# Patient Record
Sex: Male | Born: 1975 | Race: Black or African American | Hispanic: No | Smoking: Former smoker
Health system: Southern US, Community
[De-identification: ages and names within clinical notes are randomized; demographics above are authoritative.]

## PROBLEM LIST (undated history)

## (undated) ENCOUNTER — Emergency Department (HOSPITAL_COMMUNITY): Admission: EM | Payer: PRIVATE HEALTH INSURANCE | Source: Home / Self Care

## (undated) DIAGNOSIS — G8929 Other chronic pain: Secondary | ICD-10-CM

## (undated) DIAGNOSIS — Z86718 Personal history of other venous thrombosis and embolism: Secondary | ICD-10-CM

## (undated) DIAGNOSIS — M5000 Cervical disc disorder with myelopathy, unspecified cervical region: Secondary | ICD-10-CM

## (undated) DIAGNOSIS — I739 Peripheral vascular disease, unspecified: Secondary | ICD-10-CM

## (undated) DIAGNOSIS — F32A Depression, unspecified: Secondary | ICD-10-CM

## (undated) DIAGNOSIS — J189 Pneumonia, unspecified organism: Secondary | ICD-10-CM

## (undated) DIAGNOSIS — I10 Essential (primary) hypertension: Secondary | ICD-10-CM

## (undated) DIAGNOSIS — F329 Major depressive disorder, single episode, unspecified: Secondary | ICD-10-CM

## (undated) DIAGNOSIS — G4730 Sleep apnea, unspecified: Secondary | ICD-10-CM

## (undated) DIAGNOSIS — M1710 Unilateral primary osteoarthritis, unspecified knee: Secondary | ICD-10-CM

## (undated) DIAGNOSIS — K219 Gastro-esophageal reflux disease without esophagitis: Secondary | ICD-10-CM

## (undated) DIAGNOSIS — F319 Bipolar disorder, unspecified: Secondary | ICD-10-CM

## (undated) DIAGNOSIS — M2170 Unequal limb length (acquired), unspecified site: Secondary | ICD-10-CM

## (undated) DIAGNOSIS — F419 Anxiety disorder, unspecified: Secondary | ICD-10-CM

## (undated) DIAGNOSIS — A6000 Herpesviral infection of urogenital system, unspecified: Secondary | ICD-10-CM

## (undated) DIAGNOSIS — Z8489 Family history of other specified conditions: Secondary | ICD-10-CM

## (undated) DIAGNOSIS — M2550 Pain in unspecified joint: Secondary | ICD-10-CM

## (undated) DIAGNOSIS — Q6102 Congenital multiple renal cysts: Secondary | ICD-10-CM

## (undated) HISTORY — DX: Bipolar disorder, unspecified: F31.9

## (undated) HISTORY — DX: Personal history of other venous thrombosis and embolism: Z86.718

## (undated) HISTORY — DX: Unequal limb length (acquired), unspecified site: M21.70

## (undated) HISTORY — DX: Herpesviral infection of urogenital system, unspecified: A60.00

## (undated) HISTORY — DX: Unilateral primary osteoarthritis, unspecified knee: M17.10

## (undated) HISTORY — PX: KNEE SURGERY: SHX244

## (undated) HISTORY — PX: TONSILECTOMY, ADENOIDECTOMY, BILATERAL MYRINGOTOMY AND TUBES: SHX2538

## (undated) HISTORY — PX: NECK SURGERY: SHX720

---

## 2008-10-01 ENCOUNTER — Emergency Department (HOSPITAL_COMMUNITY): Admission: EM | Admit: 2008-10-01 | Discharge: 2008-10-01 | Payer: Self-pay | Admitting: Family Medicine

## 2009-04-13 ENCOUNTER — Emergency Department (HOSPITAL_COMMUNITY): Admission: EM | Admit: 2009-04-13 | Discharge: 2009-04-14 | Payer: Self-pay | Admitting: Emergency Medicine

## 2009-08-25 ENCOUNTER — Emergency Department (HOSPITAL_COMMUNITY): Admission: EM | Admit: 2009-08-25 | Discharge: 2009-08-26 | Payer: Self-pay | Admitting: Emergency Medicine

## 2009-08-25 HISTORY — PX: NECK SURGERY: SHX720

## 2009-10-31 ENCOUNTER — Inpatient Hospital Stay (HOSPITAL_COMMUNITY): Admission: EM | Admit: 2009-10-31 | Discharge: 2009-11-17 | Payer: Self-pay | Admitting: Neurosurgery

## 2009-10-31 ENCOUNTER — Encounter: Payer: Self-pay | Admitting: Emergency Medicine

## 2009-10-31 ENCOUNTER — Ambulatory Visit: Payer: Self-pay | Admitting: Emergency Medicine

## 2009-11-06 ENCOUNTER — Ambulatory Visit: Payer: Self-pay | Admitting: Physical Medicine & Rehabilitation

## 2009-11-06 ENCOUNTER — Ambulatory Visit: Payer: Self-pay | Admitting: Surgery

## 2009-11-06 ENCOUNTER — Encounter: Payer: Self-pay | Admitting: Emergency Medicine

## 2009-11-20 ENCOUNTER — Encounter: Admission: RE | Admit: 2009-11-20 | Discharge: 2010-01-15 | Payer: Self-pay | Admitting: Neurosurgery

## 2009-11-21 ENCOUNTER — Encounter: Payer: Self-pay | Admitting: Family Medicine

## 2009-11-21 ENCOUNTER — Ambulatory Visit: Payer: Self-pay | Admitting: Family Medicine

## 2009-11-21 DIAGNOSIS — F17200 Nicotine dependence, unspecified, uncomplicated: Secondary | ICD-10-CM | POA: Insufficient documentation

## 2009-11-21 DIAGNOSIS — A6000 Herpesviral infection of urogenital system, unspecified: Secondary | ICD-10-CM | POA: Insufficient documentation

## 2009-11-21 DIAGNOSIS — I82409 Acute embolism and thrombosis of unspecified deep veins of unspecified lower extremity: Secondary | ICD-10-CM | POA: Insufficient documentation

## 2009-11-21 DIAGNOSIS — Z86718 Personal history of other venous thrombosis and embolism: Secondary | ICD-10-CM

## 2009-11-21 DIAGNOSIS — I10 Essential (primary) hypertension: Secondary | ICD-10-CM

## 2009-11-21 DIAGNOSIS — G4733 Obstructive sleep apnea (adult) (pediatric): Secondary | ICD-10-CM | POA: Insufficient documentation

## 2009-11-21 HISTORY — DX: Personal history of other venous thrombosis and embolism: Z86.718

## 2009-11-21 HISTORY — DX: Herpesviral infection of urogenital system, unspecified: A60.00

## 2009-11-21 LAB — CONVERTED CEMR LAB
CO2: 22 meq/L (ref 19–32)
Chloride: 105 meq/L (ref 96–112)
Creatinine, Ser: 0.67 mg/dL (ref 0.40–1.50)
Glucose, Bld: 82 mg/dL (ref 70–99)
HDL: 47 mg/dL (ref >39)
INR: 2.3
LDL Cholesterol: 117 mg/dL — ABNORMAL HIGH (ref 0–99)
Total CHOL/HDL Ratio: 3.9

## 2009-11-22 ENCOUNTER — Encounter: Payer: Self-pay | Admitting: Family Medicine

## 2009-11-28 ENCOUNTER — Ambulatory Visit: Payer: Self-pay | Admitting: Family Medicine

## 2009-11-28 LAB — CONVERTED CEMR LAB: INR: 1.7

## 2009-12-05 ENCOUNTER — Telehealth: Payer: Self-pay | Admitting: Family Medicine

## 2009-12-05 ENCOUNTER — Ambulatory Visit: Payer: Self-pay | Admitting: Family Medicine

## 2009-12-05 LAB — CONVERTED CEMR LAB: INR: 3.1

## 2009-12-12 ENCOUNTER — Encounter: Payer: Self-pay | Admitting: Family Medicine

## 2009-12-12 ENCOUNTER — Ambulatory Visit: Payer: Self-pay | Admitting: Family Medicine

## 2009-12-26 ENCOUNTER — Ambulatory Visit: Payer: Self-pay | Admitting: Family Medicine

## 2009-12-26 LAB — CONVERTED CEMR LAB: INR: 1.7

## 2010-01-02 ENCOUNTER — Ambulatory Visit: Payer: Self-pay | Admitting: Family Medicine

## 2010-01-09 ENCOUNTER — Ambulatory Visit: Payer: Self-pay | Admitting: Family Medicine

## 2010-01-16 ENCOUNTER — Ambulatory Visit: Payer: Self-pay | Admitting: Emergency Medicine

## 2010-01-23 ENCOUNTER — Ambulatory Visit: Payer: Self-pay | Admitting: Family Medicine

## 2010-02-07 ENCOUNTER — Emergency Department (HOSPITAL_COMMUNITY): Admission: EM | Admit: 2010-02-07 | Discharge: 2010-02-07 | Payer: Self-pay | Admitting: Emergency Medicine

## 2010-02-20 ENCOUNTER — Ambulatory Visit: Payer: Self-pay | Admitting: Family Medicine

## 2010-02-20 ENCOUNTER — Encounter: Payer: Self-pay | Admitting: Family Medicine

## 2010-02-21 ENCOUNTER — Ambulatory Visit: Payer: Self-pay | Admitting: Family Medicine

## 2010-02-21 DIAGNOSIS — M542 Cervicalgia: Secondary | ICD-10-CM | POA: Insufficient documentation

## 2010-02-22 ENCOUNTER — Telehealth: Payer: Self-pay | Admitting: Family Medicine

## 2010-02-27 ENCOUNTER — Ambulatory Visit: Payer: Self-pay | Admitting: Family Medicine

## 2010-02-27 ENCOUNTER — Encounter: Payer: Self-pay | Admitting: Psychology

## 2010-03-13 ENCOUNTER — Encounter: Payer: Self-pay | Admitting: Family Medicine

## 2010-04-25 ENCOUNTER — Encounter: Payer: Self-pay | Admitting: Family Medicine

## 2010-04-25 ENCOUNTER — Observation Stay (HOSPITAL_COMMUNITY): Admission: EM | Admit: 2010-04-25 | Discharge: 2010-04-26 | Payer: Self-pay | Admitting: Emergency Medicine

## 2010-04-25 ENCOUNTER — Ambulatory Visit: Payer: Self-pay | Admitting: Family Medicine

## 2010-04-25 DIAGNOSIS — F32A Depression, unspecified: Secondary | ICD-10-CM | POA: Insufficient documentation

## 2010-04-25 DIAGNOSIS — F329 Major depressive disorder, single episode, unspecified: Secondary | ICD-10-CM

## 2010-04-26 ENCOUNTER — Ambulatory Visit: Payer: Self-pay | Admitting: Vascular Surgery

## 2010-04-26 ENCOUNTER — Telehealth: Payer: Self-pay | Admitting: Family Medicine

## 2010-04-26 ENCOUNTER — Encounter: Payer: Self-pay | Admitting: Family Medicine

## 2010-05-02 ENCOUNTER — Ambulatory Visit: Payer: Self-pay | Admitting: Family Medicine

## 2010-05-29 ENCOUNTER — Ambulatory Visit: Payer: Self-pay | Admitting: Family Medicine

## 2010-06-02 ENCOUNTER — Emergency Department (HOSPITAL_COMMUNITY)
Admission: EM | Admit: 2010-06-02 | Discharge: 2010-06-02 | Payer: Self-pay | Source: Home / Self Care | Admitting: Emergency Medicine

## 2010-06-28 ENCOUNTER — Encounter: Payer: Self-pay | Admitting: Family Medicine

## 2010-07-12 ENCOUNTER — Ambulatory Visit: Payer: Self-pay | Admitting: Family Medicine

## 2010-07-31 ENCOUNTER — Observation Stay (HOSPITAL_COMMUNITY)
Admission: EM | Admit: 2010-07-31 | Discharge: 2010-08-01 | Payer: Self-pay | Source: Home / Self Care | Admitting: Emergency Medicine

## 2010-08-01 ENCOUNTER — Encounter (INDEPENDENT_AMBULATORY_CARE_PROVIDER_SITE_OTHER): Payer: Self-pay | Admitting: Emergency Medicine

## 2010-08-02 ENCOUNTER — Ambulatory Visit: Payer: Self-pay | Admitting: Family Medicine

## 2010-08-02 DIAGNOSIS — M25571 Pain in right ankle and joints of right foot: Secondary | ICD-10-CM | POA: Insufficient documentation

## 2010-08-09 ENCOUNTER — Ambulatory Visit: Payer: Self-pay | Admitting: Family Medicine

## 2010-08-09 DIAGNOSIS — M2170 Unequal limb length (acquired), unspecified site: Secondary | ICD-10-CM

## 2010-08-09 HISTORY — DX: Unequal limb length (acquired), unspecified site: M21.70

## 2010-09-09 ENCOUNTER — Ambulatory Visit
Admission: RE | Admit: 2010-09-09 | Discharge: 2010-09-09 | Payer: Self-pay | Source: Home / Self Care | Attending: Sports Medicine | Admitting: Sports Medicine

## 2010-09-09 DIAGNOSIS — M1710 Unilateral primary osteoarthritis, unspecified knee: Secondary | ICD-10-CM | POA: Insufficient documentation

## 2010-09-09 DIAGNOSIS — G8929 Other chronic pain: Secondary | ICD-10-CM | POA: Insufficient documentation

## 2010-09-09 DIAGNOSIS — M2550 Pain in unspecified joint: Secondary | ICD-10-CM

## 2010-09-09 DIAGNOSIS — IMO0002 Reserved for concepts with insufficient information to code with codable children: Secondary | ICD-10-CM

## 2010-09-09 HISTORY — DX: Reserved for concepts with insufficient information to code with codable children: IMO0002

## 2010-09-15 ENCOUNTER — Encounter: Payer: Self-pay | Admitting: Neurosurgery

## 2010-09-24 NOTE — Assessment & Plan Note (Signed)
Summary: PE, ? OSA   Visit Type:  Hospital Follow-up Primary Provider/Referring Provider:  Romero Belling MD  CC:  blood clots/consult.  History of Present Illness: 35 yo obese gentleman with hx OSA/CPAP as a child. Occas tobacco use. I met him on hospitalization 10/2009 for acute cervical radiculopathy that required surgery by Dr Wynetta Emery. His course was complicated by PE. He tolerated anticoagulation and is currently on coumadin being followed at the [facility]. Denies bleeding, CP. Does still get some exertional SOB, has to stop to rest. Has been doing yard work, walking. His UE radicular symptoms are improving, still some numbness and weakness.    Preventive Screening-Counseling & Management  Alcohol-Tobacco     Smoking Cessation Counseling: yes     Smoke Cessation Stage: precontemplative  Current Medications (verified): 1)  Warfarin Sodium 5 Mg Tabs (Warfarin Sodium) .... Take As Directed For Inr Goal 2-3. 2)  Hydrochlorothiazide 25 Mg Tabs (Hydrochlorothiazide) .Marland Kitchen.. 1 Tab By Mouth Daily For High Blood Pressure 3)  Hydrocodone-Acetaminophen 5-500 Mg Tabs (Hydrocodone-Acetaminophen) .Marland Kitchen.. 1 Tab By Mouth Every 4 Hours As Needed For Pain 4)  Valtrex 500 Mg Tabs (Valacyclovir Hcl) .Marland Kitchen.. 1 Tab By Mouth Two Times A Day X3 Days At First Sign of Herpes Outbreak  Allergies (verified): No Known Drug Allergies  Social History: Lives with fiance (Contina Ward, dob 06/26/1972) in [place].  Unemployed from [employer].  Smokes 1-3 cigarettes daily.  Rare alcohol use. 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  The patient denies shortness of breath at rest, productive cough, non-productive cough, coughing up blood, chest pain, irregular heartbeats, loss of appetite, weight change, abdominal pain, difficulty swallowing, tooth/dental problems, headaches, nasal  congestion/difficulty breathing through nose, sneezing, itching, ear ache, anxiety, depression, hand/feet swelling, joint stiffness or pain, rash, change in color of mucus, and fever.    Vital Signs:  Patient profile:   35 year old male Height:      74 inches Weight:      327 pounds O2 Sat:      96 % on Room air Temp:     98.2 degrees F oral Pulse rate:   72 / minute BP sitting:   120 / 68  (left arm) Cuff size:   regular  Vitals Entered By: Denna Haggard, CMA (Jan 16, 2010 10:03 AM)  O2 Sat at Rest %:  96% O2 Flow:  Room air  Reason for Visit Patient states he developed blood clots after neck surgery that have now traveled to lungs.  Physical Exam  General:  Well-developed,well-nourished,in no acute distress; alert,appropriate and cooperative throughout examination Eyes:  conjunctiva and sclera clear Nose:  no deformity, discharge, inflammation, or lesions Mouth:  no deformity or lesions Neck:  no masses, thyromegaly, or abnormal cervical nodes Lungs:  clear bilaterally to auscultation and percussion Heart:  regular rate and rhythm, S1, S2 without murmurs, rubs, gallops, or clicks Abdomen:  not examined Extremities:  trace pretibial edema Neurologic:  non-focal Psych:  alert and cooperative; normal mood and affect; normal attention span and concentration   Impression & Recommendations:  Problem # 1:  PULMONARY EMBOLISM (ICD-415.19)  - coumadin x 6 mo., f/u at [facility] - hypercoag workup after off coumadin x 1 mo  Orders: New Patient Level IV (66440)  Problem # 2:  SLEEP APNEA, OBSTRUCTIVE (ICD-327.23)  -probably  needs repeat PSG in the future, not in a position right now to get it due to cost. We will revisit when he gets insurance or if he qualifies for disability.   Orders: New Patient Level IV (16109)  Problem # 3:  OBESITY, UNSPECIFIED (ICD-278.00)  Problem # 4:  TOBACCO ABUSE (ICD-305.1)  Orders: New Patient Level IV (60454)  Patient  Instructions: 1)  Please continue your coumadin as directed by Dr Constance Goltz and the [facility] 2)  You will need to be treated for 6 months with coumadin, then you can come off the medication.  3)  After you are of coumadin for a month, they will need to order labwork to see if you are at higher risk for blood clots (a hypercoaguability panel) 4)  Please arrange to see Dr Delton Coombes when you are ready to set a sleep study and talk about CPAP.  5)  Call Dr Delton Coombes if any new symptoms arise, especially shortness of breath or chest pain.    Immunization History:  Influenza Immunization History:    Influenza:  fluvax 3+ (10/25/2009)

## 2010-09-24 NOTE — Assessment & Plan Note (Signed)
Summary: F/U/KH   Vital Signs:  Patient profile:   35 year old male Height:      74 inches Weight:      346 pounds BMI:     44.58 BSA:     2.75 Temp:     98.6 degrees F Pulse rate:   97 / minute BP sitting:   133 / 89  Vitals Entered By: Jone Baseman CMA (February 27, 2010 3:09 PM) CC: f/u neck pain, sputum production, tobacco use Is Patient Diabetic? No Pain Assessment Patient in pain? yes     Location: neck Intensity: 6   Primary Care Provider:  Romero Belling MD  CC:  f/u neck pain, sputum production, and tobacco use.  History of Present Illness: 1. Neck pain:  He complains today of 6/10 posterior neck pain.  This has been going on for the past couple of months since his surgery.  Located in the cervical neck region involving mostly the paraspinal muscles.  Was evaluated for this in the ED for this on 06/16 and was given percocet #20, which helped though he is now out of them.  He has a history of neck surgery in March (see below) and was on Vicodin from his neck surgery.  He is asking for Percocet today.  He didn't get the Neurontin filled because he thought that it was too expensive.  2. Sputum production:  Complaining of spitting up brown phlegm approximately two times a day for the past 2 weeks.        ROS:  He denies abdominal pain, chest pain, dyspnea, hemoptysis, hematochezia, melena.  3. Hx of DVT: Confirmed with pt that he had only one DVT / PE after his surgery and didn't have any before that.  He has stopped Coumadin therapy      ROS: denies leg swelling, redness, warmth, or pain  4. Tobacco Use:  Smokes 3 cigars a day.  He is not really interested in smoking.  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  Quadriplegia is resolved but he complains of persistent though improving weakness and paresthesias of the bilateral upper and lower extremities.  He states  these are not getting worse and are in fact marginally better.  When I first saw him, he was using a walker, but he walks under his own power now.  He was advised by his neurosurgeon that this will slowly get better.  He has been unable to f/u with neurosurgery secondary to finances.  He was supposed to have CT of neck but cancelled it secondary to finances.    Habits & Providers  Alcohol-Tobacco-Diet     Tobacco Status: current     Tobacco Counseling: to quit use of tobacco products     Other Tobacco cigar     Other per week 3 a day  Current Medications (verified): 1)  Warfarin Sodium 5 Mg Tabs (Warfarin Sodium) .... Take As Directed For Inr Goal 2-3. 2)  Hydrochlorothiazide 25 Mg Tabs (Hydrochlorothiazide) .Marland Kitchen.. 1 Tab By Mouth Daily For High Blood Pressure 3)  Valtrex 500 Mg Tabs (Valacyclovir Hcl) .Marland Kitchen.. 1 Tab By Mouth Two Times A Day X3 Days At First Sign of Herpes Outbreak 4)  Gabapentin 100 Mg Caps (Gabapentin) .Marland Kitchen.. 1 Cap By Mouth Three Times A Day As Directed  Allergies: No Known Drug Allergies  Past History:  Past Medical History: Hx of DVT / PE  Past  Surgical History: Reviewed history from 11/21/2009 and no changes required. Urgent Diskectomy for Cord Compression, 10/2009. LEFT Knee Surgery,  [phone].  Social History: Reviewed history from 01/16/2010 and no changes required. Lives with fiance (Contina Ward, dob 06/26/1972) in [place].  Unemployed from [employer].  Smokes 1-3 cigars daily.  Rare alcohol use. 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  On probation for selling drugs  Physical Exam  General:  Well-developed,well-nourished,in no acute distress; alert,appropriate and cooperative throughout examination Mouth:  OP pink and moist.  No excessive phlegm appreciated. Neck:  supple and no masses.   Decreased extension and lateral flexsion and rotation due to pain. Lungs:  normal respiratory effort, normal breath sounds, no crackles, and no wheezes.   Heart:  normal  rate, regular rhythm, and no murmur.   Abdomen:  soft, non-tender, and normal bowel sounds.   Msk:  Cervical paraspinal muscles are TTP diffusely Neurologic:  alert & oriented X3 and cranial nerves II-XII intact.  4/5 strength bilateral upper extremities, 5/5 strength bilateral lower extremities Psych:  not anxious appearing and not depressed appearing.     Impression & Recommendations:  Problem # 1:  NECK PAIN (ICD-723.1) Assessment Unchanged  Likely postsurgical pain.  Could be combination of neurologic pain and musculoskeletal pain.  Would not fill Percocet.  Advised to try Neurotin and that it was only $10 at [facility].  Orders: [facility]- Est  Level 4 (56387)  Problem # 2:  ABNORMAL SPUTUM (ICD-786.4) Assessment: New  Likely from smoking.  Advised smoking cessation  Orders: [facility]- Est  Level 4 (99214)  Problem # 3:  OTHER POSTSURGICAL STATUS OTHER (ICD-V45.89) Assessment: New postsurgical follow up for his cervical spine surgery.  He is in the process of getting set up with [facility].  Will refer him back to Dr. Wynetta Emery for post op follow up.  Problem # 4:  DEEP VENOUS THROMBOPHLEBITIS, BILATERAL (ICD-453.40) Assessment: Improved  Confirmed that he has not had any other DVT / PE so okay to stop Coumadin therapy.  Orders: [facility]- Est  Level 4 (56433)  Problem # 5:  TOBACCO ABUSE (ICD-305.1) Assessment: Unchanged  Advised to stop smoking.  Not interested at this time.  Orders: [facility]- Est  Level 4 (29518)  Complete Medication List: 1)  Warfarin Sodium 5 Mg Tabs (Warfarin sodium) .... Take as directed for inr goal 2-3. 2)  Hydrochlorothiazide 25 Mg Tabs (Hydrochlorothiazide) .Marland Kitchen.. 1 tab by mouth daily for high blood pressure 3)  Valtrex 500 Mg Tabs (Valacyclovir hcl) .Marland Kitchen.. 1 tab by mouth two times a day x3 days at first sign of herpes outbreak 4)  Gabapentin 100 Mg Caps (Gabapentin) .Marland Kitchen.. 1 cap by mouth three times a day as directed  Prevention & Chronic Care Immunizations    Influenza vaccine: Fluvax 3+  (10/25/2009)    Tetanus booster: Not documented    Pneumococcal vaccine: Not documented  Other Screening   Smoking status: current  (02/27/2010)   Smoking cessation counseling: yes  (01/16/2010)  Hypertension   Last Blood Pressure: 133 / 89  (02/27/2010)   Serum creatinine: 0.67  (11/21/2009)   Serum potassium 4.2  (11/21/2009)  Self-Management Support :   Personal Goals (by the next clinic visit) :      Personal blood pressure goal: 140/90  (11/22/2009)   Hypertension self-management support: Not documented    Hypertension self-management support not done because: Good outcomes  (11/22/2009)

## 2010-09-24 NOTE — Assessment & Plan Note (Signed)
Summary: Behavioral Medicine Student Consultation   Primary Care Provider:  Romero Belling MD   History of Present Illness: I met with patient to discuss smoking cessation.  Patient reported smoking a box of cigars (5) per day, as well as cigarettes occasionally.  This pattern has been present for approximately 2 years.  Patient stopped smoking for a period of 7 years prior to that while incarcerated.    Allergies: No Known Drug Allergies   Impression & Recommendations:  Problem # 1:  TOBACCO ABUSE (ICD-305.1) Assessment Comment Only Patient appeared to be in the precontemplation stage of change regarding smoking cessation.  On a scale of 1-10, he rated his motivation to quit as a 2 or 3.  Similarly, he rated his confidence in his ability to quit as a 2.  His barriers to quitting included that smoking serves as a stress reliever and is enjoyable. His motivators for quitting included the presence of health problems related to smoking (coughs up brown mucus), the potential for further related health problems (e.g., potential to exacerbate pulmonary blood clots), and the high cost of cigars.  He currently smokes around 5 cigars per day, in addition to some cigarettes, and has done so for a period of 2 years.  Patient stopped smoking for a period of 7 years prior to that while incarcerated.  He reported experiencing some withdrawl symptoms that dissipated within 2 weeks.  He did not report craving cigarattes or cigars for the remainder of his time spent incarcerated.  I reflected to patient that he did not appear to be motivated to quit smoking at this time, all things considered, and he did not disagree.  I gave patient a brochure for the smoking cessation course to consider given that he has not tried medications, nicotine replacement, et Karie Soda to aid with smoking cessation in the past.  Patient's physician may wish to discuss medication options with him if he becomes more motivated to quit.   Armida Sans  Behavioral Medicine Student  February 27, 2010 4:36 PM  Complete Medication List: 1)  Warfarin Sodium 5 Mg Tabs (Warfarin sodium) .... Take as directed for inr goal 2-3. 2)  Hydrochlorothiazide 25 Mg Tabs (Hydrochlorothiazide) .Marland Kitchen.. 1 tab by mouth daily for high blood pressure 3)  Valtrex 500 Mg Tabs (Valacyclovir hcl) .Marland Kitchen.. 1 tab by mouth two times a day x3 days at first sign of herpes outbreak 4)  Gabapentin 100 Mg Caps (Gabapentin) .Marland Kitchen.. 1 cap by mouth three times a day as directed  Appended Document: Behavioral Medicine Student Consultation Reviewed note and the meeting with the student, Edison Pace.  Might also recommend future orienting Mr. Rambert.  Can he picture a time when stopping smoking would be more important?  What would need to change?  Would agree that based on Katie's assessment, patient is in precontemplation.  Discussing smoking cessation aids will likely be a bad match but potentially helpful if the motivation shifts.

## 2010-09-24 NOTE — Assessment & Plan Note (Signed)
Summary: f/up,tcb   Vital Signs:  Patient profile:   35 year old male Height:      74 inches Weight:      343.4 pounds BMI:     44.25 Temp:     98.7 degrees F oral Pulse rate:   80 / minute BP sitting:   130 / 87  (left arm) Cuff size:   large  Vitals Entered By: Garen Grams LPN (May 02, 2010 11:13 AM) CC: hfu Is Patient Diabetic? No Pain Assessment Patient in pain? yes     Location: rt side   Primary Care Provider:  Angelena Sole MD  CC:  hfu.  History of Present Illness: Hospital follow up  Pt recently discharged from the hospital because of right sided lung pain, hemoptysis and found to have a CAP.  1. CAP:  overall he is doing much better.  He is still coughing up some phlegm but no dark stuff and no hemoptysis.  Right sided lung pain is also improved.  He took 4 days of the Avelox but couldn't afford the full course.  He went and got the Rx for Azithromycin yesterday and has been taking that without any problems.  ROS: denies shortness of breath, chest pain, LE pain or swelling  Habits & Providers  Alcohol-Tobacco-Diet     Alcohol drinks/day: 0     Tobacco Status: current     Tobacco Counseling: to remain off tobacco products     Cigarette Packs/Day: <0.25  Current Medications (verified): 1)  Hydrochlorothiazide 25 Mg Tabs (Hydrochlorothiazide) .Marland Kitchen.. 1 Tab By Mouth Daily For High Blood Pressure 2)  Valtrex 500 Mg Tabs (Valacyclovir Hcl) .Marland Kitchen.. 1 Tab By Mouth Two Times A Day X3 Days At First Sign of Herpes Outbreak 3)  Gabapentin 100 Mg Caps (Gabapentin) .Marland Kitchen.. 1 Cap By Mouth Three Times A Day As Directed 4)  Celexa 20 Mg Tabs (Citalopram Hydrobromide) .Marland Kitchen.. 1 By Mouth Daily 5)  Azithromycin 500 Mg Tabs (Azithromycin) .Marland Kitchen.. 1 Tab Daily For 7 Days 6)  Cyclobenzaprine Hcl 10 Mg Tabs (Cyclobenzaprine Hcl) .Marland Kitchen.. 1 Tab By Mouth Twice A Day As Needed For Muscle Spasm  Allergies: No Known Drug Allergies  Past History:  Past Medical History: Reviewed history  from 02/27/2010 and no changes required. Hx of DVT / PE  Past Surgical History: Reviewed history from 11/21/2009 and no changes required. Urgent Diskectomy for Cord Compression, 10/2009. LEFT Knee Surgery,  [phone].  Social History: Reviewed history from 04/25/2010 and no changes required. Lives with fiance (Contina Ward, dob 06/26/1972) in [place].  Getting married next week. Unemployed from [employer].  Smokes 1-3 cigars daily.  Rare alcohol use. 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  On probation for selling drugs  Physical Exam  General:  Vs reviewed, alert, well-developed, well-nourished, and well-hydrated.   Neck:  No deformities, masses, or tenderness noted. Lungs:  normal respiratory effort, no intercostal retractions, no accessory muscle use, no crackles, no wheezes Heart:  normal rate, regular rhythm, and no murmur.   Extremities:  No clubbing, cyanosis, edema     Impression & Recommendations:  Problem # 1:  PNEUMONIA, RIGHT LOWER LOBE (ICD-486) Assessment Improved  Overall he is doing better.  Advised to complete course of antibiotics.  F/U in 2-3 weeks to check for resolution of symptoms. His updated medication list for this problem includes:    Azithromycin 500 Mg Tabs (Azithromycin) .Marland Kitchen... 1 tab daily for 7 days  Orders: [facility]- Est Level  3 (81191)  Complete Medication List: 1)  Hydrochlorothiazide 25 Mg Tabs (Hydrochlorothiazide) .Marland Kitchen.. 1 tab by mouth daily for high blood pressure 2)  Valtrex 500 Mg Tabs (Valacyclovir hcl) .Marland Kitchen.. 1 tab by mouth two times a day x3 days at first sign of herpes outbreak 3)  Gabapentin 100 Mg Caps (Gabapentin) .Marland Kitchen.. 1 cap by mouth three times a day as directed 4)  Celexa 20 Mg Tabs (Citalopram hydrobromide) .Marland Kitchen.. 1 by mouth daily 5)  Azithromycin 500 Mg Tabs (Azithromycin) .Marland Kitchen.. 1 tab daily for 7 days 6)  Cyclobenzaprine Hcl 10 Mg Tabs (Cyclobenzaprine hcl) .Marland Kitchen.. 1 tab by mouth twice a day as needed for muscle spasm  Patient  Instructions: 1)  I'm glad that you are feeling better 2)  I would like for you to take the full course of the Azithromycin 3)  Please come back and see me in 2-3 weeks to make sure you are doing better 4)  Congratulations on your upcoming wedding.

## 2010-09-24 NOTE — Miscellaneous (Signed)
  Clinical Lists Changes  Problems: Removed problem of DIARRHEA (ICD-787.91) Removed problem of PNEUMONIA, RIGHT LOWER LOBE (ICD-486) Removed problem of OTHER POSTSURGICAL STATUS OTHER (ICD-V45.89) Removed problem of ABNORMAL SPUTUM (ICD-786.4) Removed problem of PARESTHESIA (ICD-782.0) Removed problem of NAUSEA (ICD-787.02) Changed problem from PULMONARY EMBOLISM (ICD-415.19) to PULMONARY EMBOLISM, HX OF (ICD-V12.51)

## 2010-09-24 NOTE — Miscellaneous (Signed)
Summary: Hospital H and P   Vital Signs:  Patient profile:   35 year old male O2 Sat:      97 % on 2 L/min Temp:     98.3 degrees F Pulse rate:   87 / minute Pulse rhythm:   regular Resp:     18 per minute BP supine:   122 / 79  O2 Flow:  2 L/min  Primary Care Provider:  Angelena Sole MD   History of Present Illness: 35 yo AAM who presented to the ED with a 24 hour history of pain in his right side, spitting up blood and intermittent diarrhea.  Past medical history is signifigant for an urgent diskectomy for Cord Compression in 10/2009; after surgery pt subsequently developed a PE while in the hospital and required anti-coagulation treatment for 6 months.  He has been off of his coumadin since July 2011.  Pain that started yesterday was similar to the pain he had when he was dx with PE.  CTA completed in ED today showed no evidence of acute pulmonary embolism, but new right lower lobe infiltrate, suspicious for pneumonia.  Pt denies any fevers, chills, sweats, nausea, vomitting.  Endorses a cough, blood tinged spit, intermittent diarrhea and right sided pain, starting from [place] and radiating to lower right abdomen.  Good by mouth intake, good UOP.  No blood in stools or urine.  No recent sick contacts.  Pt was given IV bolus of 250 mL of NS and started on Avelox.  [facility] was asked to admit the patient for observation.   Habits & Providers  Alcohol-Tobacco-Diet     Alcohol drinks/day: 0     Tobacco Status: current     Tobacco Counseling: to remain off tobacco products  Current Medications (verified): 1)  Hydrochlorothiazide 25 Mg Tabs (Hydrochlorothiazide) .Marland Kitchen.. 1 Tab By Mouth Daily For High Blood Pressure 2)  Valtrex 500 Mg Tabs (Valacyclovir Hcl) .Marland Kitchen.. 1 Tab By Mouth Two Times A Day X3 Days At First Sign of Herpes Outbreak 3)  Gabapentin 100 Mg Caps (Gabapentin) .Marland Kitchen.. 1 Cap By Mouth Three Times A Day As Directed 4)  Celexa 20 Mg Tabs (Citalopram Hydrobromide) .Marland Kitchen.. 1 By Mouth  Daily  Allergies (verified): No Known Drug Allergies  Past History:  Past Medical History: Last updated: 02/27/2010 Hx of DVT / PE  Past Surgical History: Last updated: 11/21/2009 Urgent Diskectomy for Cord Compression, 10/2009. LEFT Knee Surgery,  [id number].  Family History: Last updated: 11/21/2009 Negative for clots. Father deceased age 62, from lung cancer. Mother alive:  osteoarthritis, HTN. Sister alive and healthy. Daughter alive and healthy.  Social History: Last updated: 04/25/2010 Lives with fiance (Contina Ward, dob 06/26/1972) in [place].  Getting married next week. Unemployed from [employer].  Smokes 1-3 cigars daily.  Rare alcohol use. 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  On probation for selling drugs  Risk Factors: Alcohol Use: 0 (04/25/2010)  Risk Factors: Smoking Status: current (04/25/2010) Packs/Day: <0.25 (11/21/2009)   Family History: Reviewed history from 11/21/2009 and no changes required. Negative for clots. Father deceased age 20, from lung cancer. Mother alive:  osteoarthritis, HTN. Sister alive and healthy. Daughter alive and healthy.  Social History: Reviewed history from 02/27/2010 and no changes required. Lives with fiance (Contina Ward, dob 06/26/1972) in [place].  Getting married next week. Unemployed from [employer].  Smokes 1-3 cigars daily.  Rare alcohol use. 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  On probation for selling drugs  Review of  Systems       The patient complains of hemoptysis and abdominal pain.  The patient denies fever, weight loss, weight gain, chest pain, syncope, dyspnea on exertion, peripheral edema, prolonged cough, melena, hematochezia, severe indigestion/heartburn, hematuria, incontinence, muscle weakness, unusual weight change, and abnormal bleeding.     Physical Exam  General:  Vs reviewed, alert, well-developed, well-nourished, and well-hydrated.   Head:  Normocephalic and atraumatic without  obvious abnormalities. No apparent alopecia or balding. Eyes:  vision grossly intact, pupils equal, pupils round, and pupils reactive to light.   Ears:  External ear exam shows no significant lesions or deformities.  Otoscopic examination reveals clear canals, tympanic membranes are intact bilaterally without bulging, retraction, inflammation or discharge. Hearing is grossly normal bilaterally. Mouth:  Oral mucosa and oropharynx without lesions or exudates.  Teeth in good repair. Neck:  No deformities, masses, or tenderness noted. Chest Wall:  No deformities, masses, tenderness or gynecomastia noted. Lungs:  normal respiratory effort, no intercostal retractions, no accessory muscle use, no crackles, no wheezes, and R decreased breath sounds.   Heart:  normal rate, regular rhythm, and no murmur.   Abdomen:  soft, normal bowel sounds, no distention, no masses, no guarding, no rigidity, no rebound tenderness, and RUQ tenderness.   Pulses:  R and L carotid,radial,femoral,dorsalis pedis and posterior tibial pulses are full and equal bilaterally Extremities:  No clubbing, cyanosis, edema, or deformity noted with normal full range of motion of all joints.   Neurologic:  alert & oriented X3, cranial nerves II-XII intact, strength normal in all extremities, and DTRs symmetrical and normal.  decreased sensation in left hand Skin:  turgor normal, color normal, and no rashes.   Cervical Nodes:  No lymphadenopathy noted Psych:  not anxious appearing and not depressed appearing.     Labs:  CBC: 8.1>15.1/42.4<206  13 % monocytes  CMP: 138/3.4/102/25/11/.078<108 Ca: 9.0 Tot protein: 7.1 Albumin: 3.7 AST 22 ALT 21 Alk phos: 87 Tot bili: 1.1  PTT 28 PT: 13.1 INR: 0.97  POC CE: myoglobin: 54.6 CKMB: <1.0 Trop I: <0.05  EKG: NSR @ 76 BPM  CTA: No evidence of acute pulmonary embolism.   2.  New right lower lobe infiltrate, suspicious for pneumonia.   Chest radiographic followup is recommended to  confirm resolution.   3.  No evidence of mass or pleural effusion.  Impression & Recommendations:  Problem # 1:  PNEUMONIA, RIGHT LOWER LOBE (ICD-486) Started on Avelox in ED, will continue Avelox 400mg  by mouth daily x 7 day course. Repeat CXR in AM.  Consider other imaging if RUQ pain persists. Wean off O2  Problem # 2:  NAUSEA (ICD-787.02) continue zofran PRN  Problem # 3:  HYPERTENSION, BENIGN ESSENTIAL (ICD-401.1) stable. continue HCTZ as long as by mouth intake remains adequate His updated medication list for this problem includes:    Hydrochlorothiazide 25 Mg Tabs (Hydrochlorothiazide) .Marland Kitchen... 1 tab by mouth daily for high blood pressure  Problem # 4:  DIARRHEA (ICD-787.91) intermittent per pt report.  None in the past 12 hours.  No recent Anx use. If reoccurs will get stool cultures and c. diff  Problem # 5:  TOBACCO ABUSE (ICD-305.1) smoking cessation counseling  Problem # 6:  DEPRESSION, MILD (ICD-311) stable, continue celexa His updated medication list for this problem includes:    Celexa 20 Mg Tabs (Citalopram hydrobromide) .Marland Kitchen... 1 by mouth daily  Problem # 7:  FEN/GI D/c IVF, regular diet repeat electrolytes in AM PPI for PPX  Problem #  8:  Code status Full  Problem # 9:  VTE PPX Heparin 5000 units Subcutaneously Q 8 hours  Problem # 10:  Dispo: Will get PT consult Wean off O2 Likely d/c in AM if improved  Complete Medication List: 1)  Hydrochlorothiazide 25 Mg Tabs (Hydrochlorothiazide) .Marland Kitchen.. 1 tab by mouth daily for high blood pressure 2)  Valtrex 500 Mg Tabs (Valacyclovir hcl) .Marland Kitchen.. 1 tab by mouth two times a day x3 days at first sign of herpes outbreak 3)  Gabapentin 100 Mg Caps (Gabapentin) .Marland Kitchen.. 1 cap by mouth three times a day as directed 4)  Celexa 20 Mg Tabs (Citalopram hydrobromide) .Marland Kitchen.. 1 by mouth daily  ]

## 2010-09-24 NOTE — Assessment & Plan Note (Signed)
Summary: f/u admission/eo   Vital Signs:  Patient profile:   35 year old male Height:      74 inches Weight:      352 pounds BMI:     45.36 Pulse rate:   80 / minute BP sitting:   125 / 90  (right arm) Cuff size:   large  Vitals Entered By: Tessie Fass CMA (August 02, 2010 9:40 AM) CC: left knee pain Is Patient Diabetic? No Pain Assessment Patient in pain? yes      Intensity: 9   Primary Care Provider:  Angelena Sole MD  CC:  left knee pain.  History of Present Illness: Hospital follow up for left knee pain 1. Pt was seen in the hospital and kept overnight for this left knee pain because of the concern of recurrent blood clot.  He had knee pain that started at work a couple of days ago, this was associated with significant swelling.  He went to the hospital where they did an ultrasound which was negative for DVT.  He was sent home with crutches and told to take Tylenol.  Tylenol doesn't help.  Pain is rated an 8/10.  He doesn't remember any specific injury or inciting event.  He has been working a lot since he started back at [employer] and has been on his feet a lot during the day.  He doesn't remember twisting his knee.  He has had this knee pain before and has been evaluated in the ED a couple different times.  He was told that he has severe degenerative changes and likely has a meniscal injury.  ROS: endorses knee and leg swelling.  Denies catching, locking, or giving out.  Normal sensation and strength of the leg.  2. Neck pain:  Stable.  He ran out of the Gabapentin and needs it refilled  ROS: denies numbness / weakness  Habits & Providers  Alcohol-Tobacco-Diet     Tobacco Status: current     Tobacco Counseling: to quit use of tobacco products     Cigarette Packs/Day: 0.5  Current Medications (verified): 1)  Hydrochlorothiazide 25 Mg Tabs (Hydrochlorothiazide) .Marland Kitchen.. 1 Tab By Mouth Daily For High Blood Pressure 2)  Valtrex 500 Mg Tabs (Valacyclovir Hcl) .Marland Kitchen.. 1  Tab By Mouth Two Times A Day X3 Days At First Sign of Herpes Outbreak 3)  Celexa 20 Mg Tabs (Citalopram Hydrobromide) .Marland Kitchen.. 1 By Mouth Daily 4)  Tramadol Hcl 50 Mg Tabs (Tramadol Hcl) .Marland Kitchen.. 1 Tab By Mouth Every 8 Hours As Needed For Pain 5)  Gabapentin 100 Mg Caps (Gabapentin) .Marland Kitchen.. 1 Tab By Mouth Three Times A Day  Allergies: No Known Drug Allergies  Past History:  Past Medical History: Reviewed history from 07/12/2010 and no changes required. Hx of DVT / PE Hx of PNA  Social History: Reviewed history from 05/29/2010 and no changes required. 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    Smokes 1-3 cigars daily.  Rare alcohol use. 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  On probation for selling drugs.  Starting working back at [employer]  Physical Exam  General:  Vs reviewed, alert, well-developed, well-nourished, and well-hydrated.   Neck:  No deformities, masses, or tenderness noted. Lungs:  normal respiratory effort, no intercostal retractions, no accessory muscle use, no crackles, no wheezes Heart:  normal rate, regular rhythm, and no murmur.   Msk:  Left knee:  some swelling around the knee joint that goes into the calf.  Not red or warm.  No gross deformity.  Decreased extension to 10 degrees, decreased flexion to 110 degrees.  Good stability.  + Lachmans.  Crepitus with range of motion.  Negative laxity with valgus/varus stressing  Right knee:  no swelling.  Full ROM. Extremities:  no calf pain or tenderness Additional Exam:  left knee xray from 03/2009:  IMPRESSION:  Apparent old post-traumatic deformity of the medial tibial plateau and proximal tibia and fibula with associated advanced degenerative changes.  No definite acute bony abnormality.   Impression & Recommendations:  Problem # 1:  KNEE PAIN, LEFT (ICD-719.46) Assessment New  No definite injury or inciting event.  He does have a hx of knee pain and has been evaluated in the ED.  Prior x-rays were  studied which showed degenerative changes especially in the medial compartment.  This is likely acute flare of his osteoarthritis or a reinjury of a  medial meniscus from being up on his feet a lot more at work.  Will send to sports medicine for ultrasound and evaluation.  Treat pain with Tramadol for now. His updated medication list for this problem includes:    Tramadol Hcl 50 Mg Tabs (Tramadol hcl) .Marland Kitchen... 1 tab by mouth every 8 hours as needed for pain  Orders: Sports Medicine (Sports Med)  Problem # 2:  NECK PAIN (ICD-723.1) Assessment: Unchanged  Stable.  Refilled Gabepentin. His updated medication list for this problem includes:    Tramadol Hcl 50 Mg Tabs (Tramadol hcl) .Marland Kitchen... 1 tab by mouth every 8 hours as needed for pain  Complete Medication List: 1)  Hydrochlorothiazide 25 Mg Tabs (Hydrochlorothiazide) .Marland Kitchen.. 1 tab by mouth daily for high blood pressure 2)  Valtrex 500 Mg Tabs (Valacyclovir hcl) .Marland Kitchen.. 1 tab by mouth two times a day x3 days at first sign of herpes outbreak 3)  Celexa 20 Mg Tabs (Citalopram hydrobromide) .Marland Kitchen.. 1 by mouth daily 4)  Tramadol Hcl 50 Mg Tabs (Tramadol hcl) .Marland Kitchen.. 1 tab by mouth every 8 hours as needed for pain 5)  Gabapentin 100 Mg Caps (Gabapentin) .Marland Kitchen.. 1 tab by mouth three times a day  Other Orders: [facility]- Est  Level 4 ([address])  Patient Instructions: 1)  I think that you might have hurt your meniscus 2)  We will try Tramadol for the pain 3)  Please schedule an appointment with sports medicine up front for the next available appointment Prescriptions: GABAPENTIN 100 MG CAPS (GABAPENTIN) 1 tab by mouth three times a day  #90 x 3   Entered and Authorized by:   Angelena Sole MD   Signed by:   Angelena Sole MD on 08/02/2010   Method used:   Electronically to        [facility] [address] [phone]* (retail)       [address]       [address]       [place], [place]  [address]       Ph: [phone]       Fax: [phone]   RxID:    [id number] TRAMADOL HCL 50 MG TABS (TRAMADOL HCL) 1 tab by mouth every 8 hours as needed for pain  #60 x 1   Entered and Authorized by:   Angelena Sole MD   Signed by:   Angelena Sole MD on 08/02/2010   Method used:   Electronically to        [facility] [address] [phone]* (retail)       [address]  [address]       [place], [place]  [address]       Ph: [phone]       Fax: [phone]   RxID:   [id number]    Orders Added: 1)  [facility]- Est  Level 4 [[address]] 2)  Sports Medicine [Sports Med]

## 2010-09-24 NOTE — Assessment & Plan Note (Signed)
Summary: F/U/KH   Vital Signs:  Patient profile:   35 year old male Height:      74 inches Weight:      348.44 pounds BMI:     44.90 BSA:     2.75 O2 Sat:      98 % on Room air Temp:     98.7 degrees F Pulse rate:   78 / minute BP sitting:   131 / 90  Vitals Entered By: Jone Baseman CMA (May 29, 2010 10:32 AM)  O2 Flow:  Room air CC: F/U PNA Is Patient Diabetic? No Pain Assessment Patient in pain? no        Primary Care Provider:  Angelena Sole MD  CC:  F/U PNA.  History of Present Illness: 1. F/U PNA:  He completed the antibiotics as prescribed after last office visit.  His breathing and coughing has improved.  He denies any further chest or rib pain.  Back to normal per patient.  ROS: denies fever, cough, or pain.  2. Diarrhea:  He has had this for the past 2 weeks.  It has been staying aout the same.  Not getting better or worse.  Nothing seems to make it better.  It started after he completed the antibiotics and thinks that it may be related to that.  He has been having 6-7 loose stools per day and they are very foul smelling.  ROS:  denies abdominal pain or cramping.  Endorses normal by mouth intake.  Denies dysuria, rectal bleeding.  SocHx:  He doesn't have any insurance and no [employer].  He is in the process of getting set up with his wife's insurance and should have that by next week.  He cannot afford any other prescriptions or tests at this time.  Habits & Providers  Alcohol-Tobacco-Diet     Alcohol drinks/day: 0     Tobacco Status: current     Tobacco Counseling: to remain off tobacco products     Cigarette Packs/Day: 0.5  Current Medications (verified): 1)  Hydrochlorothiazide 25 Mg Tabs (Hydrochlorothiazide) .Marland Kitchen.. 1 Tab By Mouth Daily For High Blood Pressure 2)  Valtrex 500 Mg Tabs (Valacyclovir Hcl) .Marland Kitchen.. 1 Tab By Mouth Two Times A Day X3 Days At First Sign of Herpes Outbreak 3)  Celexa 20 Mg Tabs (Citalopram Hydrobromide) .Marland Kitchen.. 1 By  Mouth Daily  Allergies: No Known Drug Allergies  Past History:  Past Medical History: Reviewed history from 02/27/2010 and no changes required. 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   Unemployed from [employer].  Smokes 1-3 cigars daily.  Rare alcohol use. 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  On probation for selling drugsPacks/Day:  0.5  Physical Exam  General:  Vs reviewed, alert, well-developed, well-nourished, and well-hydrated.   Lungs:  normal respiratory effort, no intercostal retractions, no accessory muscle use, no crackles, no wheezes Heart:  normal rate, regular rhythm, and no murmur.   Abdomen:  soft, normal bowel sounds, no distention, no masses, no guarding, no rigidity, no rebound tenderness, and RUQ tenderness.   Msk:  no back or rib pain Extremities:  No clubbing, cyanosis, edema     Impression & Recommendations:  Problem # 1:  PNEUMONIA, RIGHT LOWER LOBE (ICD-486) Assessment Improved  Resolved.  Follow up as needed. The following medications were removed from the medication list:    Azithromycin 500 Mg Tabs (Azithromycin) .Marland Kitchen... 1 tab daily for 7  days  Orders: [facility]- Est  Level 4 ([address])  Problem # 2:  DIARRHEA (ICD-787.91) Assessment: New  Seems to be associated with antibiotic use.  Could be C-diff but the diarrhea is not that severe.  He cannot afford other lab testing or prescriptions at this point because it would be all out of pocket.  Will treat him with OTC probiotics and see if that helps.  He should have insurance in the next week or so will have him come back in 1 week to see how he is doing.  If not better would consider getting c-diff culture and treating for that.  Orders: [facility]- Est  Level 4 ([address])  Complete Medication List: 1)  Hydrochlorothiazide 25 Mg Tabs (Hydrochlorothiazide) .Marland Kitchen.. 1 tab by mouth daily for high blood pressure 2)  Valtrex 500 Mg Tabs (Valacyclovir hcl) .Marland Kitchen.. 1 tab  by mouth two times a day x3 days at first sign of herpes outbreak 3)  Celexa 20 Mg Tabs (Citalopram hydrobromide) .Marland Kitchen.. 1 by mouth daily  Patient Instructions: 1)  The diarrhea that you are having is probably caused by the antibiotics that you are taking 2)  If not better in 1 week we will run some tests and probably put you on some antibiotics 3)  In the mean time I want you to start taking Probiotics.  You can get them over the counter at any pharmacy.  Take 2 tabs twice a day. 4)  Please schedule a follow up appointment in 1 week

## 2010-09-24 NOTE — Progress Notes (Signed)
Summary: phone note/refill  Phone Note From Other Clinic Call back at [phone]   Caller: robin Call For: Dr. Lelon Perla Summary of Call: pt is to have hydrocodone apap 5-500 and does not have a script pt is ready for d/c and will need these meds  Follow-up for Phone Call        diane from HD called. pt cannot afford moxifloxin.  also wants cyclobenzaprine. he was discharged late friday  pcp texted for answer Follow-up by: Golden Circle RN,  April 30, 2010 11:42 AM  Additional Follow-up for Phone Call Additional follow up Details #1::        Hydrocodone is not on his med list or discharge summary so I will not give him that.  He should at least be on Azithromycin for an antibiotic.  He should have gotten a Rx for cyclobenzaprine at discharge. Additional Follow-up by: Angelena Sole MD,  April 30, 2010 12:53 PM    Additional Follow-up for Phone Call Additional follow up Details #2::    the pharmacist stated he did not have a current rx for flexeril.  he cannot afford the antibiotic that was ordered so they are asking that you change it & send it in. if you will, plz add these meds to his med list & I will send to health dept.  I will tell pt no pain meds as not on his discharge list Follow-up by: Golden Circle RN,  April 30, 2010 2:29 PM  Additional Follow-up for Phone Call Additional follow up Details #3:: Details for Additional Follow-up Action Taken: Meds entered Additional Follow-up by: Angelena Sole MD,  May 01, 2010 8:57 AM  New/Updated Medications: AZITHROMYCIN 500 MG TABS (AZITHROMYCIN) 1 tab daily for 7 days CYCLOBENZAPRINE HCL 10 MG TABS (CYCLOBENZAPRINE HCL) 1 tab by mouth twice a day as needed for muscle spasm Prescriptions: CYCLOBENZAPRINE HCL 10 MG TABS (CYCLOBENZAPRINE HCL) 1 tab by mouth twice a day as needed for muscle spasm  #20 x 0   Entered by:   Golden Circle RN   Authorized by:   Angelena Sole MD   Signed by:   Golden Circle RN on  05/01/2010   Method used:   Printed then faxed to ...       [facility] Department (retail)       [address] [place], [place]  [address]       Ph: [phone]       Fax: [phone]   RxID:   [id number] AZITHROMYCIN 500 MG TABS (AZITHROMYCIN) 1 tab daily for 7 days  #7 x 0   Entered by:   Golden Circle RN   Authorized by:   Angelena Sole MD   Signed by:   Golden Circle RN on 05/01/2010   Method used:   Printed then faxed to ...       [facility] Department (retail)       [address] [place], [place]  [address]       Ph: [phone]       Fax: [phone]   RxID:   [id number]  faxed the antibiotic to the health dept. back to md for number of flexeril tabs.Golden Circle RN  May 01, 2010 9:51 AM   Please send in 20 of the Flexeril Angelena Sole MD  May 01, 2010 9:55 AM   faxed to health department.Marland KitchenMarland KitchenMarland KitchenGolden Circle RN  May 01, 2010 9:59 AM

## 2010-09-24 NOTE — Assessment & Plan Note (Signed)
Summary: f/u,df   Vital Signs:  Patient profile:   35 year old male Height:      74 inches Weight:      350 pounds BMI:     45.10 BSA:     2.76 Temp:     98.4 degrees F Pulse rate:   102 / minute BP sitting:   159 / 96  Vitals Entered By: Jone Baseman CMA (July 12, 2010 2:07 PM) CC: f/u Is Patient Diabetic? No Pain Assessment Patient in pain? no        Primary Care Provider:  Angelena Sole MD  CC:  f/u.  History of Present Illness: 1. Diarrhea:  Has resolved.  Has been having normal stools  2. HTN:  He hasn't been taking the medicines for the past couple of weeks.  He doesn't check his blood pressure at home regularly.  ROS: denies chest pain, shortness of breath, or vision changes  3. Clearance for work:  He would like to go back to work.  He hasn't been working at [employer] for the past 9 months because of all of his medical issues.  He feels that everything has resolved.  He feels back to normal and wants to get back to work.  He would like to ease into it though.  Habits & Providers  Alcohol-Tobacco-Diet     Alcohol drinks/day: 0     Tobacco Status: current     Tobacco Counseling: to remain off tobacco products     Cigarette Packs/Day: 0.5  Current Medications (verified): 1)  Hydrochlorothiazide 25 Mg Tabs (Hydrochlorothiazide) .Marland Kitchen.. 1 Tab By Mouth Daily For High Blood Pressure 2)  Valtrex 500 Mg Tabs (Valacyclovir Hcl) .Marland Kitchen.. 1 Tab By Mouth Two Times A Day X3 Days At First Sign of Herpes Outbreak 3)  Celexa 20 Mg Tabs (Citalopram Hydrobromide) .Marland Kitchen.. 1 By Mouth Daily  Allergies: No Known Drug Allergies  Past History:  Past Medical History: Hx of DVT / PE Hx of PNA  Social History: Reviewed history from 05/29/2010 and no changes required. 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   Unemployed from [employer].  Smokes 1-3 cigars daily.  Rare alcohol use. 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  On probation for selling  drugs  Physical Exam  General:  Vs reviewed, alert, well-developed, well-nourished, and well-hydrated.   Eyes:  vision grossly intact Neck:  No deformities, masses, or tenderness noted. Lungs:  normal respiratory effort, no intercostal retractions, no accessory muscle use, no crackles, no wheezes Heart:  normal rate, regular rhythm, and no murmur.   Abdomen:  soft, normal bowel sounds, no distention, no masses, no guarding, no rigidity, no rebound tenderness Msk:  normal ROM, no joint tenderness, and no joint swelling.   Extremities:  No clubbing, cyanosis, edema   Neurologic:  alert & oriented X3, cranial nerves II-XII intact, strength normal in all extremities, and DTRs symmetrical and normal.   Psych:  normally interactive, not anxious appearing, and not depressed appearing.     Impression & Recommendations:  Problem # 1:  HYPERTENSION, BENIGN ESSENTIAL (ICD-401.1) Assessment Unchanged  Asvised him to restart the HCTZ.  F/U in 2-3 months to recheck. His updated medication list for this problem includes:    Hydrochlorothiazide 25 Mg Tabs (Hydrochlorothiazide) .Marland Kitchen... 1 tab by mouth daily for high blood pressure  Orders: [facility]- Est Level  3 (16109)  Complete Medication List: 1)  Hydrochlorothiazide 25 Mg Tabs (Hydrochlorothiazide) .Marland Kitchen.. 1 tab by mouth daily for  high blood pressure 2)  Valtrex 500 Mg Tabs (Valacyclovir hcl) .Marland Kitchen.. 1 tab by mouth two times a day x3 days at first sign of herpes outbreak 3)  Celexa 20 Mg Tabs (Citalopram hydrobromide) .Marland Kitchen.. 1 by mouth daily   Orders Added: 1)  [facility]- Est Level  3 [47829]

## 2010-09-24 NOTE — Assessment & Plan Note (Signed)
Summary: neck pain   Vital Signs:  Patient profile:   35 year old male Height:      74 inches Weight:      337 pounds BMI:     43.42 BSA:     2.72 Temp:     98.5 degrees F Pulse rate:   81 / minute BP sitting:   119 / 83  Vitals Entered By: Jone Baseman CMA (February 21, 2010 11:04 AM) CC: neck pain Is Patient Diabetic? No Pain Assessment Patient in pain? yes     Location: neck   Primary Care Provider:  Romero Belling MD  CC:  neck pain.  History of Present Illness: 35 yo male here for  NECK PAIN (ICD-723.1) He complains today of 6/10 posterior neck pain.  Was evaluated for this in the ED for this on 06/16 and was given percocet #20, which helped though he is now out of them.  He has a history of neck surgery in March (see below) and was on Vicodin from his neck surgery.  NAUSEA (ICD-787.02) Also complains of nausea and spitting up brown phlegm approximately two times a day for the past 2 weeks.  He denies abdominal pain, chest pain, dyspnea, hemoptysis, hematochezia, melena.  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  Quadriplegia is resolved but he complains of persistent though improving weakness and paresthesias of the bilateral upper and lower extremities.  He states these are not getting worse and are in fact marginally better.  When I first saw him, he was using a walker, but he walks under his own power now.  He was advised by his neurosurgeon that this will slowly get better.  He has been unable to f/u with neurosurgery secondary to finances.  He was supposed to have CT of neck but cancelled it secondary to finances.  COUMADIN THERAPY (ICD-V58.61)   - PULMONARY EMBOLISM (ICD-415.19)   - DEEP VENOUS THROMBOPHLEBITIS, BILATERAL (ICD-453.40) Started on coumadin for DVT and PE 03/15.  Was during hospitalization for spinal surgery.  Today deneis  chest pain, dyspnea, leg pain and swelling.  Last INR was 1.1 yesterday as he ran out of money for warfarin and hadn't taken it for a few days.  Is 1.1 again today.  I also asked about  HAND PAIN, RIGHT (ICD-729.5) Complained of this in April.  It is resolved now.  Habits & Providers  Alcohol-Tobacco-Diet     Tobacco Status: current     Tobacco Counseling: to quit use of tobacco products  Allergies (verified): No Known Drug Allergies  Review of Systems       Per HPI.  Physical Exam  Additional Exam:  VITALS:  Reviewed, normal GEN: Alert & oriented, no acute distress CARDIO: Regular rate and rhythm, no murmurs/rubs/gallops, 2+ bilateral radial pulses RESP: Clear to auscultation, normal work of breathing, no retractions/accessory muscle use ABD: Normoactive bowel sounds, nontender, no masses/hepatosplenomegaly EXT: Nontender, no edema NEURO:  4/5 strength bilateral upper extremities, 5/5 strength bilateral lower extremities MSK:  Neck without tenderness or swelling, moves freely side to side and forward flexion, extension limited by pain    Impression & Recommendations:  Problem # 1:  NECK PAIN (ICD-723.1) Assessment New  Likely neuropathic pain.  No new neurological symptoms--in fact neuro symptoms seem to continue to improve since March hospitailzation.  Have started Gabapentin today--starting low and increasing slowly.  Very important that he have imaging of  neck done soon as previously recommended by neurosurgery--he cancelled this previously secondary to finances so have sent him to see Rudell Cobb today and advised RTC next week to have this arranged.  Orders: [facility]- Est  Level 4 ([id number])  Problem # 2:  NAUSEA (ICD-787.02) Assessment: New  Unclear cause at this time.  Consider nausea caused by narcotics as was on percocet during this time.  Is coughing up brown phlegm with this--but no signs of upper or lower GI bleeding.  Advised patient that he should have rectal  exam and FOBT today to rule out GI bleeding, but he adamantly declines.  Agrees to follow up in 1 week to ensure is resolving.  Orders: [facility]- Est  Level 4 (29562)  Problem # 3:  PARESTHESIA (ICD-782.0) Assessment: Improved  Seems to be continued improvement since March surgery.  Discussed the importance of going immediately to the emergency room if he develops increasing weakness or numbness in legs or arms.  Orders: [facility]- Est  Level 4 (13086)  Problem # 4:  COUMADIN THERAPY (ICD-V58.61) Assessment: Unchanged Anticoagulated now for 3 months after DVT and PE with known precipitant (c-spine surgery).  Precepted with Dr. Leveda Anna who agrees okay to stop anticoagulation at this time. Orders: INR/PT-[facility] (57846) [facility]- Est  Level 4 (96295)  Complete Medication List: 1)  Warfarin Sodium 5 Mg Tabs (Warfarin sodium) .... Take as directed for inr goal 2-3. 2)  Hydrochlorothiazide 25 Mg Tabs (Hydrochlorothiazide) .Marland Kitchen.. 1 tab by mouth daily for high blood pressure 3)  Valtrex 500 Mg Tabs (Valacyclovir hcl) .Marland Kitchen.. 1 tab by mouth two times a day x3 days at first sign of herpes outbreak 4)  Gabapentin 100 Mg Caps (Gabapentin) .Marland Kitchen.. 1 cap by mouth three times a day as directed  Patient Instructions: 1)  Please see Rudell Cobb to qualify for reduced or free medical services within the [facility].  Call her at [phone] today. 2)  For the Gabapentin:  take 1 tab by mouth at bedtime for 1 week, then 1 tab by mouth two times a day for 1 week, then 1 tab by mouth three times a day every day thereafter. 3)  Stop taking the warfarin. 4)  Please schedule a follow-up appointment in 1 week with Dr. Lelon Perla to see how you are doing with the Gabapentin and to arrange imaging of your neck. Prescriptions: GABAPENTIN 100 MG CAPS (GABAPENTIN) 1 cap by mouth three times a day as directed  #90 x 0   Entered and Authorized by:   Romero Belling MD   Signed by:   Romero Belling MD on 02/21/2010   Method used:   Print then  Give to Patient   RxID:   [id number]

## 2010-09-24 NOTE — Miscellaneous (Signed)
Summary: wants appt  Clinical Lists Changes was here for labs. c/o neck & upper back pain. had surgery 3/11. has no insurance so he cannot go back there. went to ED last wd 6/22 for c/o pain in surgical area. they gave him percocet.  also has been coughing up brown mucous. had missed a few days of coumadin since he did not have the money to go get it. bought it yesterday & restarted.  I gave him info on local food banks, [employer] number for help in several areas in [place]. lives with fianceee who is trying to help all she can. gave him info on help wanted signs I have seen recently.Golden Circle RN  February 20, 2010 11:25 AM

## 2010-09-24 NOTE — Progress Notes (Signed)
  Phone Note Outgoing Call   Summary of Call: Kennon Rounds Dr Constance Goltz saw this patient yesterday--Dr Constance Goltz called worrying that the patients cough might be getting worse and he does not see his new PCP until next week--can u call Julian Lucas and make sure he is not a lot worse--if he is let me know Thanks!  Denny Levy MD  February 22, 2010 2:31 PM   Follow-up for Phone Call        states he feels about the same. has not gotten rx yet Follow-up by: Golden Circle RN,  February 22, 2010 2:33 PM

## 2010-09-24 NOTE — Progress Notes (Signed)
Summary: Rx Req  Phone Note Refill Request Call back at Home Phone [phone] Message from:  Patient  Refills Requested: Medication #1:  HYDROCODONE-ACETAMINOPHEN 5-500 MG TABS 1 tab by mouth every 4 hours as needed for pain Initial call taken by: Clydell Hakim,  December 05, 2009 2:10 PM  Follow-up for Phone Call        Denied.  All refills must be from his neurosurgeon as previously discussed. Follow-up by: Romero Belling MD,  December 05, 2009 2:46 PM  Additional Follow-up for Phone Call Additional follow up Details #1::        spoke with pt and gave him message.  he said OK. Additional Follow-up by: De Nurse,  December 06, 2009 10:49 AM

## 2010-09-24 NOTE — Consult Note (Signed)
Summary: Psychology Report  Psychology Report   Imported By: Clydell Hakim 03/22/2010 15:16:14  _____________________________________________________________________  External Attachment:    Type:   Image     Comment:   External Document

## 2010-09-24 NOTE — Miscellaneous (Signed)
Summary: R hand  Clinical Lists Changes here for labs but c/o r hand being swollen & painful. work in appt with his pcp at 4.Golden Circle RN  December 12, 2009 3:26 PM

## 2010-09-24 NOTE — Assessment & Plan Note (Signed)
Summary: r hand swollen & painful/[place]   Vital Signs:  Patient profile:   35 year old male Height:      74 inches Weight:      329 pounds BMI:     42.39 BSA:     2.69 Temp:     99.4 degrees F Pulse rate:   85 / minute BP sitting:   133 / 89  Vitals Entered By: Jone Baseman CMA (December 12, 2009 4:26 PM) CC: raight hand swollen and painfuk x 1 day Is Patient Diabetic? No Pain Assessment Patient in pain? yes     Location: right hand Intensity: 7   CC:  raight hand swollen and painfuk x 1 day.  History of Present Illness: 1 day h/o RIGHT hand swelling.  [place], 7/10 pressure pain.  No trauma, insect bites.  No limited motion.  Is anticoagulated and at goal.  Has tried nothing for the pain, but has Rx's for Diclofenac and Vicodin from orthopedics.  Habits & Providers  Alcohol-Tobacco-Diet     Tobacco Status: current     Tobacco Counseling: to quit use of tobacco products     Other Tobacco cigar     Other per week 14  Current Medications (verified): 1)  Warfarin Sodium 5 Mg Tabs (Warfarin Sodium) .... Take As Directed For Inr Goal 2-3. 2)  Hydrochlorothiazide 25 Mg Tabs (Hydrochlorothiazide) .Marland Kitchen.. 1 Tab By Mouth Daily For High Blood Pressure 3)  Hydrocodone-Acetaminophen 5-500 Mg Tabs (Hydrocodone-Acetaminophen) .Marland Kitchen.. 1 Tab By Mouth Every 4 Hours As Needed For Pain 4)  Valtrex 500 Mg Tabs (Valacyclovir Hcl) .Marland Kitchen.. 1 Tab By Mouth Two Times A Day X3 Days At First Sign of Herpes Outbreak  Allergies (verified): No Known Drug Allergies  Physical Exam  General:  Well-developed,well-nourished,in no acute distress; alert,appropriate and cooperative throughout examination Msk:  3 cm swelling dorsum RIGHT hand.  Mild tenderness.  FROM wrist, MCP, PIP, DIP joints.  No erythema.  No puncture site.   Impression & Recommendations:  Problem # 1:  HAND PAIN, RIGHT (ICD-729.5) Assessment New Localized swelling with no functional limitation.  Unknown underlying etiology.  Supportive care  for now:  ice , Diclofenac (has rx from orthopedics), ace wrap.  f/u in 1 week if not better.  Orders: [facility]- Est Level  3 (16109)  Complete Medication List: 1)  Warfarin Sodium 5 Mg Tabs (Warfarin sodium) .... Take as directed for inr goal 2-3. 2)  Hydrochlorothiazide 25 Mg Tabs (Hydrochlorothiazide) .Marland Kitchen.. 1 tab by mouth daily for high blood pressure 3)  Hydrocodone-acetaminophen 5-500 Mg Tabs (Hydrocodone-acetaminophen) .Marland Kitchen.. 1 tab by mouth every 4 hours as needed for pain 4)  Valtrex 500 Mg Tabs (Valacyclovir hcl) .Marland Kitchen.. 1 tab by mouth two times a day x3 days at first sign of herpes outbreak

## 2010-09-24 NOTE — Letter (Signed)
Summary: Lipid Letter  [facility]  [address]   [place], [place] [address]   Phone: [phone]  Fax: [phone]    11/22/2009  Julian Lucas [address] [place], [place]  [address]  Dear Julian Lucas:  We have carefully reviewed your last lipid profile from 11/21/2009 and the results are noted below with a summary of recommendations for lipid management.    Cholesterol:       183     Goal: <200   HDL "good" Cholesterol:   47     Goal: >40   LDL "bad" Cholesterol:   117     Goal: <160   Triglycerides:       97     Goal: <150    You are at goal for your cholesterol.  If you have any questions, please call. We appreciate being able to work with you.   Sincerely,  Romero Belling MD  Appended Document: Lipid Letter mailed.

## 2010-09-24 NOTE — Assessment & Plan Note (Signed)
Summary: np-bilat DVT/needs pt/inr,df   Vital Signs:  Patient profile:   35 year old male Height:      74 inches Weight:      328 pounds BMI:     42.26 Temp:     98.8 degrees F Pulse rate:   96 / minute BP sitting:   133 / 82  Vitals Entered By: Jone Baseman CMA (November 21, 2009 9:01 AM) CC: NP-DVT Is Patient Diabetic? No Pain Assessment Patient in pain? yes     Location: right leg Intensity: 6   CC:  NP-DVT.  History of Present Illness: 35 yo male presenting to establish care.  No prior PCP.  Was hospitalized 03/09-03/26 for urgent diskectomy and fusion @ C4-5 and C5-6 for cervical cord compression.  He had presented with quadriplegia.  Today he reports persistent weakness in all four extremities as well as persistent numbness in the bilateral hands and some in the legs, though the leg numbness is improving.  He is currently walking with the assistance of a walker.  Dr. Wynetta Emery is his neurosurgeon, and he will follow up with him.  During the hospitalization, he became dyspneic and was transferred to the ICU on 03/15, and was found to have bilateral DVTs and multiple PEs on 03/15 and was started on Warfarin in the hospital (bridged with IV heparin).  He was discharged on Warfarin 10 mg by mouth daily on 03/26.  His current INR is 2.3.  Denies dypsnea and chest pain, endorses some leg pain.  Hospital labs and radiology reviewed--no discharge summary available at this time.  Also c/o genital herpes.  No outbreak x7-8 months.  Uses Valtrex x1 week at [place].  Habits & Providers  Alcohol-Tobacco-Diet     Tobacco Status: current     Tobacco Counseling: to quit use of tobacco products     Cigarette Packs/Day: <0.25  Current Problems (verified): 1)  Hypertension, Benign Essential  (ICD-401.1) 2)  Pulmonary Embolism  (ICD-415.19) 3)  Deep Venous Thrombophlebitis, Bilateral  (ICD-453.40) 4)  Coumadin Therapy  (ICD-V58.61) 5)  Sleep Apnea, Obstructive  (ICD-327.23) 6)  Obesity,  Unspecified  (ICD-278.00)  Current Medications (verified): 1)  Warfarin Sodium 5 Mg Tabs (Warfarin Sodium) .... Take As Directed For Inr Goal 2-3. 2)  Hydrochlorothiazide 25 Mg Tabs (Hydrochlorothiazide) .Marland Kitchen.. 1 Tab By Mouth Daily For High Blood Pressure 3)  Hydrocodone-Acetaminophen 5-500 Mg Tabs (Hydrocodone-Acetaminophen) .Marland Kitchen.. 1 Tab By Mouth Every 4 Hours As Needed For Pain  Allergies (verified): No Known Drug Allergies  Past History:  Past Surgical History: Urgent Diskectomy for Cord Compression, 10/2009. LEFT Knee Surgery,  [phone].  Family History: Negative for clots. Father deceased age 70, from lung cancer. Mother alive:  osteoarthritis, HTN. Sister alive and healthy. Daughter alive and healthy.  Social History: Lives with fiance (Contina Ward, dob 06/26/1972) in [place].  Unemployed from [employer].  Smokes 1-3 cigarettes daily.  Rare alcohol use.Smoking Status:  current Packs/Day:  <0.25  Physical Exam  Additional Exam:  VITALS:  Reviewed, normal GEN: Alert & oriented, no acute distress, obese NECK: Neck brace CARDIO: Regular rate and rhythm, no murmurs/rubs/gallops, 2+ bilateral radial pulses RESP: Clear to auscultation, normal work of breathing, no retractions/accessory muscle use EXT: Nontender, no edema NEURO:  CN II-XII intact, 5/5 strength x4 extremities    Impression & Recommendations:  Problem # 1:  HYPERTENSION, BENIGN ESSENTIAL (ICD-401.1) Assessment Improved  His updated medication list for this problem includes:    Hydrochlorothiazide 25 Mg Tabs (Hydrochlorothiazide) .Marland KitchenMarland KitchenMarland KitchenMarland Kitchen 1  tab by mouth daily for high blood pressure  BP today: 133/82  Orders: Basic Met-[facility] ([address]) Lipid-[facility] ([address]-91478)  Problem # 2:  PULMONARY EMBOLISM (ICD-415.19) Assessment: Improved  His updated medication list for this problem includes:    Warfarin Sodium 5 Mg Tabs (Warfarin sodium) .Marland Kitchen... Take as directed for inr goal 2-3.  Problem # 3:  DEEP VENOUS  THROMBOPHLEBITIS, BILATERAL (ICD-453.40) Assessment: Improved  Orders: INR/PT-[facility] (29562)  Problem # 4:  COUMADIN THERAPY (ICD-V58.61) Assessment: New INR therapeutic today at 2.3.  Goal 2-3 for PE and DVT, which occurred during 10/2009 hospitalization for neck surgery.  Will require 3-4 months of anticoagulation . Orders: INR/PT-[facility] (13086)  Problem # 5:  SLEEP APNEA, OBSTRUCTIVE (ICD-327.23) Assessment: Comment Only Not using CPAP.  Problem # 6:  OBESITY, UNSPECIFIED (ICD-278.00) Assessment: Comment Only Not addressed in detail today.  Patient knows he needs to lose weight.  Problem # 7:  HERPES GENITALIS (ICD-054.10) Assessment: Improved No active outbreak.  Rx sent for Valtrex.  Problem # 8:  TOBACCO ABUSE (ICD-305.1) Assessment: Unchanged Given quitline info today.  Not ready to quit.  Complete Medication List: 1)  Warfarin Sodium 5 Mg Tabs (Warfarin sodium) .... Take as directed for inr goal 2-3. 2)  Hydrochlorothiazide 25 Mg Tabs (Hydrochlorothiazide) .Marland Kitchen.. 1 tab by mouth daily for high blood pressure 3)  Hydrocodone-acetaminophen 5-500 Mg Tabs (Hydrocodone-acetaminophen) .Marland Kitchen.. 1 tab by mouth every 4 hours as needed for pain 4)  Valtrex 500 Mg Tabs (Valacyclovir hcl) .Marland Kitchen.. 1 tab by mouth two times a day x3 days at first sign of herpes outbreak  Patient Instructions: 1)  Pleasure to meet you today. 2)  You will need to be on the Warfarin for 3-4 months and will require regular INR checks during this time.  Continue to take 10 mg (2 x 5 mg tablets of the Warfarin for the next week). 3)  One of the most important things you can do for your health is to STOP SMOKING.  When you are ready to quit smoking, please call the "quit line" at 1-800-QUIT-NOW (915 711 0739).  You can get more information about the quit line at [email or web address].ee.  It is free! 4)  I've sent prescriptions for Warfarin, Hydrochlorothiazide, and Valtrex to your pharmacy. 5)  Please schedule a LAB VISIT  in  1 week to check INR. 6)  Please schedule a follow-up appointment in 1 month with Dr. Constance Goltz. Prescriptions: VALTREX 500 MG TABS (VALACYCLOVIR HCL) 1 tab by mouth two times a day x3 days at first sign of herpes outbreak  #6 x 6   Entered and Authorized by:   Romero Belling MD   Signed by:   Romero Belling MD on 11/21/2009   Method used:   Electronically to        [facility] [address] [phone]* (retail)       [address]       [address]       [place], [place]  [address]       Ph: [phone]       Fax: [phone]   RxID:   [id number] HYDROCHLOROTHIAZIDE 25 MG TABS (HYDROCHLOROTHIAZIDE) 1 tab by mouth daily for high blood pressure  #30 x 3   Entered and Authorized by:   Romero Belling MD   Signed by:   Romero Belling MD on 11/21/2009   Method used:   Electronically to        [facility] [address] [phone]* (retail)       [address]       [address]       [place], [place]  [address]       Ph: [phone]       Fax: [phone]   RxID:   [id number] WARFARIN SODIUM 5 MG TABS (WARFARIN SODIUM) Take as directed for INR goal 2-3.  #60 x 0   Entered and Authorized by:   Romero Belling MD   Signed by:   Romero Belling MD on 11/21/2009   Method used:   Electronically to        [facility] [address] [phone]* (retail)       [address]       [address]       [place], [place]  [address]       Ph: [phone]       Fax: [phone]   RxID:   [id number]    ANTICOAGULATION RECORD  NEW REGIMEN & LAB RESULTS Anticoag. Dx: Deep venous thrombosis Current INR Goal Range: 2-3 Current INR: 2.3 Current Coumadin Dose(mg): 5mg  tablets Regimen: continue 10mg  daily  Provider: Dr. Constance Goltz Repeat testing in: 1 week Other Comments: ...........test performed by...........Marland KitchenTerese Door, CMA   Dose has been reviewed with patient or caretaker during this visit. Reviewed by: Dr. Constance Goltz  Appended Document: np-bilat DVT/needs pt/inr,df    Clinical Lists  Changes  Observations: Added new observation of MEDRECON: current updated (11/22/2009 17:07) Added new observation of HTNSMSUPPACT: Good outcomes (11/22/2009 17:07) Added new observation of PERSBPGOAL: 140/90 (11/22/2009 17:07) Added new observation of HTN PROGRESS: At goal (11/22/2009 17:07) Added new observation of HTN FSREVIEW: Yes (11/22/2009 17:07) Added new observation of DM PROGRESS: N/A (11/22/2009 17:07) Added new observation of DM FSREVIEW: N/A (11/22/2009 17:07) Added new observation of LIPID PROGRS: N/A (11/22/2009 17:07) Added new observation of LIPID FSREVW: N/A (11/22/2009 17:07)       Prevention & Chronic Care Immunizations   Influenza vaccine: Not documented    Tetanus booster: Not documented    Pneumococcal vaccine: Not documented  Other Screening   Smoking status: current  (11/21/2009)  Hypertension   Last Blood Pressure: 133 / 82  (11/21/2009)   Serum creatinine: 0.67  (11/21/2009)   Serum potassium 4.2  (11/21/2009)    Hypertension flowsheet reviewed?: Yes   Progress toward BP goal: At goal  Self-Management Support :   Personal Goals (by the next clinic visit) :      Personal blood pressure goal: 140/90  (11/22/2009)   Hypertension self-management support: Not documented    Hypertension self-management support not done because: Good outcomes  (11/22/2009)      Current Medications (verified): 1)  Warfarin Sodium 5 Mg Tabs (Warfarin Sodium) .... Take As Directed For Inr Goal 2-3. 2)  Hydrochlorothiazide 25 Mg Tabs (Hydrochlorothiazide) .Marland Kitchen.. 1 Tab By Mouth Daily For High Blood Pressure 3)  Hydrocodone-Acetaminophen 5-500 Mg Tabs (Hydrocodone-Acetaminophen) .Marland Kitchen.. 1 Tab By Mouth Every 4 Hours As Needed For Pain 4)  Valtrex 500 Mg Tabs (Valacyclovir Hcl) .Marland Kitchen.. 1 Tab By Mouth Two Times A Day X3 Days At First Sign of Herpes Outbreak  Allergies: No Known Drug Allergies
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
## 2010-09-26 NOTE — Assessment & Plan Note (Signed)
Summary: KNEE/KH   Vital Signs:  Patient profile:   34 year old male BP sitting:   126 / 95  Vitals Entered By: Lillia Pauls CMA (August 09, 2010 9:31 AM)  Primary Provider:  Angelena Sole MD   History of Present Illness: Pt reports significant knee pain for last 1-2 weeks. Was recenty hospitalized for knee pain and recurrence of DVT in setting of hx/o DVT. Workup was essentially negative per pt. Pt reports significant L knee pain. Pain worse with weight bearing. Feels that pain has significantly worsened since he has resumed working at [employer]. Describes pain as stabbing in nature. Pain in post and anterolateral knee. + swelling. Reports weightbearing >8-10 hours per day. Pt does report hx/o ?growth plate fractureas a child with subsequent pin placement and removal. + Locking and giving away of knee per pt. Pain unrelieved with high doses of tylenol, neurontin, and tramadol.   Allergies: No Known Drug Allergies  Physical Exam  General:  alert, morbidly obese  Msk:  Knee: + swelling over suprapatellar area on knee tenderness to palpation along medial and lateral joint line Decreased ROM flexion and extension and lower leg rotation. Ligaments with solid consistent endpoints including ACL, PCL, LCL, MCL. +  Mcmurray's and provocative meniscal tests. + painful patellar compression. Patellar and quadriceps tendons unremarkable. Hamstring and quadriceps strength is normal.   Unequal leg length  - this is 2 cms and throws off his gait with [place] to left Additional Exam:  MSK [place] There is considerable irregularity and loss of contour of medial meniscus on left some irregularity laterally as well small effusion in suprapatellar pouch tendons intact  images saved   Impression & Recommendations:  Problem # 1:  KNEE PAIN, LEFT (ICD-719.46) Assessment Deteriorated [place] indicative of significant meniscal disease and suprapatellar edema. Intraarticular joint injection given  for pain relief. Knee sleeve given. Foot orthosis given in setting of unequal leg length.  Plan to followup in 1 month. Pt instructed to continue with tramadol and neurontin.  His updated medication list for this problem includes:    Tramadol Hcl 50 Mg Tabs (Tramadol hcl) .Marland Kitchen... 1 tab by mouth every 8 hours as needed for pain  Orders: Foot Orthosis ( Arch Strap/Heel Cup) [phone]) [place] LIMITED [phone]) Joint Aspirate / Injection, Large ([address]) Kenalog 10 mg inj (J3301)   cleanse with alcohol Topical analgesic spray : Ethyl choride Joint - LT knee Approached in typical fashion with:US localization of suprapatellar pouch and direct injection into this area Completed without difficulty x difficulty getting easy flow into pouch/ patient noted some relief afterwards Meds: 1 cc kenalog 40 + 5 cc lidocaine 1% Needle:25 G and 1.5 in Aftercare instructions and Red flags advised  I think this is a meniscus tear but w his weight and other issues conservative care seems advisable at first  Problem # 2:  UNEQUAL LEG LENGTH (ICD-736.81) Foot orthosis given to help stabilize unequal leg length.  Orders: Foot Orthosis ( Arch Strap/Heel Cup) [phone])  we will add these to left and note that his gait is less unequal p placement  reck here or w Dr Lelon Perla in 1 month  Complete Medication List: 1)  Hydrochlorothiazide 25 Mg Tabs (Hydrochlorothiazide) .Marland Kitchen.. 1 tab by mouth daily for high blood pressure 2)  Valtrex 500 Mg Tabs (Valacyclovir hcl) .Marland Kitchen.. 1 tab by mouth two times a day x3 days at first sign of herpes outbreak 3)  Celexa 20 Mg Tabs (Citalopram hydrobromide) .Marland Kitchen.. 1 by mouth daily  4)  Tramadol Hcl 50 Mg Tabs (Tramadol hcl) .Marland Kitchen.. 1 tab by mouth every 8 hours as needed for pain 5)  Gabapentin 100 Mg Caps (Gabapentin) .Marland Kitchen.. 1 tab by mouth three times a day   Orders Added: 1)  Foot Orthosis ( Arch Strap/Heel Cup) [E4540] 2)  Est. Patient Level IV [98119] 3)  [place] LIMITED [14782] 4)  Joint Aspirate /  Injection, Large [20610] 5)  Kenalog 10 mg inj [J3301]

## 2010-10-27 ENCOUNTER — Emergency Department (HOSPITAL_COMMUNITY)
Admission: EM | Admit: 2010-10-27 | Discharge: 2010-10-27 | Disposition: A | Discharge status: Home / Self Care | Payer: BC Managed Care – PPO | Attending: Emergency Medicine | Admitting: Emergency Medicine

## 2010-10-27 ENCOUNTER — Emergency Department (HOSPITAL_COMMUNITY): Payer: BC Managed Care – PPO

## 2010-10-27 ENCOUNTER — Encounter (HOSPITAL_COMMUNITY): Payer: Self-pay | Admitting: Radiology

## 2010-10-27 DIAGNOSIS — Z79899 Other long term (current) drug therapy: Secondary | ICD-10-CM | POA: Insufficient documentation

## 2010-10-27 DIAGNOSIS — I10 Essential (primary) hypertension: Secondary | ICD-10-CM | POA: Insufficient documentation

## 2010-10-27 DIAGNOSIS — Z86718 Personal history of other venous thrombosis and embolism: Secondary | ICD-10-CM | POA: Insufficient documentation

## 2010-10-27 DIAGNOSIS — M129 Arthropathy, unspecified: Secondary | ICD-10-CM | POA: Insufficient documentation

## 2010-10-27 DIAGNOSIS — R112 Nausea with vomiting, unspecified: Secondary | ICD-10-CM | POA: Insufficient documentation

## 2010-10-27 DIAGNOSIS — M549 Dorsalgia, unspecified: Secondary | ICD-10-CM | POA: Insufficient documentation

## 2010-10-27 DIAGNOSIS — E669 Obesity, unspecified: Secondary | ICD-10-CM | POA: Insufficient documentation

## 2010-10-27 DIAGNOSIS — R630 Anorexia: Secondary | ICD-10-CM | POA: Insufficient documentation

## 2010-10-27 DIAGNOSIS — K5900 Constipation, unspecified: Secondary | ICD-10-CM | POA: Insufficient documentation

## 2010-10-27 DIAGNOSIS — G8929 Other chronic pain: Secondary | ICD-10-CM | POA: Insufficient documentation

## 2010-10-27 DIAGNOSIS — R109 Unspecified abdominal pain: Secondary | ICD-10-CM | POA: Insufficient documentation

## 2010-10-27 HISTORY — DX: Essential (primary) hypertension: I10

## 2010-10-27 LAB — CBC
Platelets: 222 10*3/uL (ref 150–400)
RBC: 4.79 MIL/uL (ref 4.22–5.81)
WBC: 6.3 10*3/uL (ref 4.0–10.5)

## 2010-10-27 LAB — COMPREHENSIVE METABOLIC PANEL
AST: 22 U/L (ref 0–37)
Albumin: 4 g/dL (ref 3.5–5.2)
Alkaline Phosphatase: 78 U/L (ref 39–117)
Chloride: 102 mEq/L (ref 96–112)
GFR calc Af Amer: >60 mL/min (ref >60)
Potassium: 3.6 mEq/L (ref 3.5–5.1)
Sodium: 136 mEq/L (ref 135–145)
Total Bilirubin: 1.2 mg/dL (ref 0.3–1.2)

## 2010-10-27 LAB — URINALYSIS, ROUTINE W REFLEX MICROSCOPIC
Bilirubin Urine: NEGATIVE
Glucose, UA: NEGATIVE mg/dL
Ketones, ur: NEGATIVE mg/dL
pH: 5 (ref 5.0–8.0)

## 2010-10-27 LAB — DIFFERENTIAL
Lymphs Abs: 2.9 10*3/uL (ref 0.7–4.0)
Monocytes Absolute: 0.6 10*3/uL (ref 0.1–1.0)
Monocytes Relative: 10 % (ref 3–12)
Neutro Abs: 2.4 10*3/uL (ref 1.7–7.7)
Neutrophils Relative %: 39 % — ABNORMAL LOW (ref 43–77)

## 2010-10-27 MED ORDER — IOHEXOL 300 MG/ML  SOLN
100.0000 mL | Freq: Once | INTRAMUSCULAR | Status: AC | PRN
  Administered 2010-10-27: 100 mL via INTRAVENOUS

## 2010-11-05 LAB — POCT I-STAT, CHEM 8
Creatinine, Ser: 0.8 mg/dL (ref 0.4–1.5)
Hemoglobin: 15.6 g/dL (ref 13.0–17.0)
Potassium: 3.9 mEq/L (ref 3.5–5.1)
Sodium: 144 mEq/L (ref 135–145)

## 2010-11-07 LAB — COMPREHENSIVE METABOLIC PANEL
ALT: 21 U/L (ref 0–53)
AST: 15 U/L (ref 0–37)
BUN: 13 mg/dL (ref 6–23)
CO2: 25 mEq/L (ref 19–32)
CO2: 27 mEq/L (ref 19–32)
Calcium: 8.6 mg/dL (ref 8.4–10.5)
Calcium: 9 mg/dL (ref 8.4–10.5)
Chloride: 102 mEq/L (ref 96–112)
Chloride: 106 mEq/L (ref 96–112)
Creatinine, Ser: 0.78 mg/dL (ref 0.4–1.5)
Creatinine, Ser: 0.82 mg/dL (ref 0.4–1.5)
GFR calc Af Amer: >60 mL/min (ref >60)
GFR calc non Af Amer: >60 mL/min (ref >60)
GFR calc non Af Amer: >60 mL/min (ref >60)
Glucose, Bld: 108 mg/dL — ABNORMAL HIGH (ref 70–99)
Glucose, Bld: 94 mg/dL (ref 70–99)
Total Bilirubin: 1.1 mg/dL (ref 0.3–1.2)
Total Bilirubin: 1.1 mg/dL (ref 0.3–1.2)

## 2010-11-07 LAB — POCT I-STAT, CHEM 8
BUN: 12 mg/dL (ref 6–23)
Chloride: 108 mEq/L (ref 96–112)
Creatinine, Ser: 0.7 mg/dL (ref 0.4–1.5)
Creatinine, Ser: 0.8 mg/dL (ref 0.4–1.5)
Glucose, Bld: 109 mg/dL — ABNORMAL HIGH (ref 70–99)
Glucose, Bld: 96 mg/dL (ref 70–99)
HCT: 46 % (ref 39.0–52.0)
Hemoglobin: 15.6 g/dL (ref 13.0–17.0)
Potassium: 3.6 mEq/L (ref 3.5–5.1)
Potassium: 4.2 mEq/L (ref 3.5–5.1)
TCO2: 27 mmol/L (ref 0–100)

## 2010-11-07 LAB — CBC
HCT: 40.3 % (ref 39.0–52.0)
HCT: 42.4 % (ref 39.0–52.0)
Hemoglobin: 14 g/dL (ref 13.0–17.0)
Hemoglobin: 15.1 g/dL (ref 13.0–17.0)
Hemoglobin: 15.1 g/dL (ref 13.0–17.0)
MCH: 31.1 pg (ref 26.0–34.0)
MCH: 31.4 pg (ref 26.0–34.0)
MCH: 31.7 pg (ref 26.0–34.0)
MCHC: 34.7 g/dL (ref 30.0–36.0)
MCHC: 35.6 g/dL (ref 30.0–36.0)
MCV: 89.1 fL (ref 78.0–100.0)
MCV: 90.4 fL (ref 78.0–100.0)
RBC: 4.46 MIL/uL (ref 4.22–5.81)
RBC: 4.76 MIL/uL (ref 4.22–5.81)
RBC: 4.86 MIL/uL (ref 4.22–5.81)
WBC: 7.7 10*3/uL (ref 4.0–10.5)

## 2010-11-07 LAB — DIFFERENTIAL
Basophils Absolute: 0 10*3/uL (ref 0.0–0.1)
Eosinophils Absolute: 0.2 10*3/uL (ref 0.0–0.7)
Eosinophils Relative: 2 % (ref 0–5)
Lymphocytes Relative: 38 % (ref 12–46)
Lymphs Abs: 2.6 10*3/uL (ref 0.7–4.0)
Lymphs Abs: 2.9 10*3/uL (ref 0.7–4.0)
Monocytes Absolute: 1.1 10*3/uL — ABNORMAL HIGH (ref 0.1–1.0)
Monocytes Relative: 10 % (ref 3–12)
Monocytes Relative: 13 % — ABNORMAL HIGH (ref 3–12)
Neutro Abs: 3.8 10*3/uL (ref 1.7–7.7)
Neutrophils Relative %: 50 % (ref 43–77)
Neutrophils Relative %: 53 % (ref 43–77)

## 2010-11-07 LAB — DRUGS OF ABUSE SCREEN W/O ALC, ROUTINE URINE
Amphetamine Screen, Ur: NEGATIVE
Barbiturate Quant, Ur: NEGATIVE
Benzodiazepines.: NEGATIVE
Creatinine,U: 216.1 mg/dL
Marijuana Metabolite: POSITIVE — AB
Methadone: NEGATIVE

## 2010-11-07 LAB — POCT CARDIAC MARKERS: CKMB, poc: <1 ng/mL — ABNORMAL LOW (ref 1.0–8.0)

## 2010-11-07 LAB — PROTIME-INR
INR: 0.97 (ref 0.00–1.49)
Prothrombin Time: 13.1 seconds (ref 11.6–15.2)

## 2010-11-07 LAB — C-REACTIVE PROTEIN: CRP: 6 mg/dL — ABNORMAL HIGH (ref <0.6)

## 2010-11-18 LAB — URINALYSIS, ROUTINE W REFLEX MICROSCOPIC
Glucose, UA: NEGATIVE mg/dL
Ketones, ur: NEGATIVE mg/dL
pH: 6 (ref 5.0–8.0)

## 2010-11-18 LAB — CBC
HCT: 36.9 % — ABNORMAL LOW (ref 39.0–52.0)
HCT: 36.9 % — ABNORMAL LOW (ref 39.0–52.0)
HCT: 37.7 % — ABNORMAL LOW (ref 39.0–52.0)
HCT: 37.9 % — ABNORMAL LOW (ref 39.0–52.0)
HCT: 40.7 % (ref 39.0–52.0)
Hemoglobin: 12.6 g/dL — ABNORMAL LOW (ref 13.0–17.0)
Hemoglobin: 12.8 g/dL — ABNORMAL LOW (ref 13.0–17.0)
Hemoglobin: 13.1 g/dL (ref 13.0–17.0)
Hemoglobin: 13.1 g/dL (ref 13.0–17.0)
Hemoglobin: 13.2 g/dL (ref 13.0–17.0)
Hemoglobin: 14.1 g/dL (ref 13.0–17.0)
Hemoglobin: 15.1 g/dL (ref 13.0–17.0)
Hemoglobin: 15.7 g/dL (ref 13.0–17.0)
Hemoglobin: 16.4 g/dL (ref 13.0–17.0)
MCHC: 34 g/dL (ref 30.0–36.0)
MCHC: 34 g/dL (ref 30.0–36.0)
MCHC: 34.5 g/dL (ref 30.0–36.0)
MCHC: 34.7 g/dL (ref 30.0–36.0)
MCHC: 34.7 g/dL (ref 30.0–36.0)
MCHC: 34.8 g/dL (ref 30.0–36.0)
MCV: 93.7 fL (ref 78.0–100.0)
MCV: 94.1 fL (ref 78.0–100.0)
MCV: 94.1 fL (ref 78.0–100.0)
MCV: 94.2 fL (ref 78.0–100.0)
MCV: 94.3 fL (ref 78.0–100.0)
MCV: 94.4 fL (ref 78.0–100.0)
MCV: 94.7 fL (ref 78.0–100.0)
MCV: 95.4 fL (ref 78.0–100.0)
Platelets: 111 10*3/uL — ABNORMAL LOW (ref 150–400)
Platelets: 112 10*3/uL — ABNORMAL LOW (ref 150–400)
Platelets: 210 10*3/uL (ref 150–400)
Platelets: 225 10*3/uL (ref 150–400)
Platelets: 258 10*3/uL (ref 150–400)
Platelets: 279 10*3/uL (ref 150–400)
RBC: 3.91 MIL/uL — ABNORMAL LOW (ref 4.22–5.81)
RBC: 3.92 MIL/uL — ABNORMAL LOW (ref 4.22–5.81)
RBC: 4.03 MIL/uL — ABNORMAL LOW (ref 4.22–5.81)
RBC: 4.03 MIL/uL — ABNORMAL LOW (ref 4.22–5.81)
RBC: 4.08 MIL/uL — ABNORMAL LOW (ref 4.22–5.81)
RBC: 4.28 MIL/uL (ref 4.22–5.81)
RBC: 4.31 MIL/uL (ref 4.22–5.81)
RBC: 4.57 MIL/uL (ref 4.22–5.81)
RBC: 4.84 MIL/uL (ref 4.22–5.81)
RBC: 5.03 MIL/uL (ref 4.22–5.81)
RDW: 12.4 % (ref 11.5–15.5)
RDW: 12.4 % (ref 11.5–15.5)
RDW: 12.4 % (ref 11.5–15.5)
RDW: 12.6 % (ref 11.5–15.5)
RDW: 12.7 % (ref 11.5–15.5)
WBC: 11.7 10*3/uL — ABNORMAL HIGH (ref 4.0–10.5)
WBC: 8.3 10*3/uL (ref 4.0–10.5)
WBC: 8.3 10*3/uL (ref 4.0–10.5)
WBC: 9.1 10*3/uL (ref 4.0–10.5)
WBC: 9.3 10*3/uL (ref 4.0–10.5)
WBC: 9.5 10*3/uL (ref 4.0–10.5)
WBC: 9.7 10*3/uL (ref 4.0–10.5)
WBC: 9.8 10*3/uL (ref 4.0–10.5)
WBC: 9.8 10*3/uL (ref 4.0–10.5)
WBC: 9.9 10*3/uL (ref 4.0–10.5)

## 2010-11-18 LAB — BASIC METABOLIC PANEL
BUN: 16 mg/dL (ref 6–23)
BUN: 19 mg/dL (ref 6–23)
BUN: 22 mg/dL (ref 6–23)
CO2: 27 mEq/L (ref 19–32)
CO2: 27 mEq/L (ref 19–32)
CO2: 28 mEq/L (ref 19–32)
Calcium: 8.9 mg/dL (ref 8.4–10.5)
Calcium: 9.1 mg/dL (ref 8.4–10.5)
Calcium: 9.2 mg/dL (ref 8.4–10.5)
Chloride: 102 mEq/L (ref 96–112)
Chloride: 95 mEq/L — ABNORMAL LOW (ref 96–112)
Chloride: 97 mEq/L (ref 96–112)
Chloride: 98 mEq/L (ref 96–112)
Creatinine, Ser: 0.68 mg/dL (ref 0.4–1.5)
Creatinine, Ser: 0.81 mg/dL (ref 0.4–1.5)
Creatinine, Ser: 0.85 mg/dL (ref 0.4–1.5)
Creatinine, Ser: 0.96 mg/dL (ref 0.4–1.5)
GFR calc Af Amer: >60 mL/min (ref >60)
GFR calc Af Amer: >60 mL/min (ref >60)
GFR calc Af Amer: >60 mL/min (ref >60)
GFR calc Af Amer: >60 mL/min (ref >60)
GFR calc non Af Amer: >60 mL/min (ref >60)
Potassium: 4.1 mEq/L (ref 3.5–5.1)
Potassium: 4.8 mEq/L (ref 3.5–5.1)
Sodium: 130 mEq/L — ABNORMAL LOW (ref 135–145)
Sodium: 135 mEq/L (ref 135–145)

## 2010-11-18 LAB — DIFFERENTIAL
Basophils Absolute: 0 10*3/uL (ref 0.0–0.1)
Basophils Absolute: 0 10*3/uL (ref 0.0–0.1)
Basophils Absolute: 0 10*3/uL (ref 0.0–0.1)
Basophils Absolute: 0.1 10*3/uL (ref 0.0–0.1)
Basophils Relative: 0 % (ref 0–1)
Basophils Relative: 0 % (ref 0–1)
Eosinophils Relative: 0 % (ref 0–5)
Eosinophils Relative: 2 % (ref 0–5)
Lymphocytes Relative: 25 % (ref 12–46)
Lymphocytes Relative: 25 % (ref 12–46)
Lymphocytes Relative: 27 % (ref 12–46)
Lymphocytes Relative: 28 % (ref 12–46)
Lymphocytes Relative: 30 % (ref 12–46)
Lymphs Abs: 2.4 10*3/uL (ref 0.7–4.0)
Lymphs Abs: 2.5 10*3/uL (ref 0.7–4.0)
Lymphs Abs: 2.6 10*3/uL (ref 0.7–4.0)
Lymphs Abs: 2.7 10*3/uL (ref 0.7–4.0)
Monocytes Absolute: 0.8 10*3/uL (ref 0.1–1.0)
Monocytes Absolute: 0.9 10*3/uL (ref 0.1–1.0)
Monocytes Absolute: 0.9 10*3/uL (ref 0.1–1.0)
Monocytes Relative: 9 % (ref 3–12)
Monocytes Relative: 9 % (ref 3–12)
Neutro Abs: 4.7 10*3/uL (ref 1.7–7.7)
Neutro Abs: 5.4 10*3/uL (ref 1.7–7.7)
Neutro Abs: 5.6 10*3/uL (ref 1.7–7.7)
Neutro Abs: 5.6 10*3/uL (ref 1.7–7.7)
Neutro Abs: 6 10*3/uL (ref 1.7–7.7)
Neutrophils Relative %: 58 % (ref 43–77)
Neutrophils Relative %: 61 % (ref 43–77)
Neutrophils Relative %: 63 % (ref 43–77)

## 2010-11-18 LAB — T-HELPER CELLS (CD4) COUNT (NOT AT ARMC)
CD4 % Helper T Cell: 28 % — ABNORMAL LOW (ref 33–55)
CD4 T Cell Abs: 240 uL — ABNORMAL LOW (ref 400–2700)

## 2010-11-18 LAB — PROTIME-INR
INR: 1.04 (ref 0.00–1.49)
INR: 1.11 (ref 0.00–1.49)
INR: 1.19 (ref 0.00–1.49)
INR: 1.35 (ref 0.00–1.49)
INR: 1.61 — ABNORMAL HIGH (ref 0.00–1.49)
INR: 1.76 — ABNORMAL HIGH (ref 0.00–1.49)
INR: 1.82 — ABNORMAL HIGH (ref 0.00–1.49)
INR: 1.85 — ABNORMAL HIGH (ref 0.00–1.49)
INR: 1.92 — ABNORMAL HIGH (ref 0.00–1.49)
INR: 2.23 — ABNORMAL HIGH (ref 0.00–1.49)
Prothrombin Time: 13.5 seconds (ref 11.6–15.2)
Prothrombin Time: 20.9 seconds — ABNORMAL HIGH (ref 11.6–15.2)
Prothrombin Time: 21.2 seconds — ABNORMAL HIGH (ref 11.6–15.2)
Prothrombin Time: 21.8 seconds — ABNORMAL HIGH (ref 11.6–15.2)

## 2010-11-18 LAB — CK: Total CK: 198 U/L (ref 7–232)

## 2010-11-18 LAB — HEPARIN LEVEL (UNFRACTIONATED)
Heparin Unfractionated: 0.25 IU/mL — ABNORMAL LOW (ref 0.30–0.70)
Heparin Unfractionated: 0.35 IU/mL (ref 0.30–0.70)
Heparin Unfractionated: 0.38 IU/mL (ref 0.30–0.70)
Heparin Unfractionated: 0.43 IU/mL (ref 0.30–0.70)
Heparin Unfractionated: 0.47 IU/mL (ref 0.30–0.70)
Heparin Unfractionated: 0.5 IU/mL (ref 0.30–0.70)
Heparin Unfractionated: 0.56 IU/mL (ref 0.30–0.70)
Heparin Unfractionated: 0.62 IU/mL (ref 0.30–0.70)

## 2010-11-18 LAB — T4, FREE: Free T4: 1.18 ng/dL (ref 0.80–1.80)

## 2010-11-18 LAB — MAGNESIUM: Magnesium: 2 mg/dL (ref 1.5–2.5)

## 2010-11-18 LAB — VITAMIN B12: Vitamin B-12: 535 pg/mL (ref 211–911)

## 2010-11-18 LAB — CK TOTAL AND CKMB (NOT AT ARMC): Total CK: 199 U/L (ref 7–232)

## 2010-11-18 LAB — FOLATE: Folate: 17.7 ng/mL

## 2010-11-18 LAB — TSH: TSH: 0.867 u[IU]/mL (ref 0.350–4.500)

## 2010-11-18 LAB — VITAMIN B1: Vitamin B1 (Thiamine): 12 nmol/L (ref 9–44)

## 2010-11-18 LAB — RAPID URINE DRUG SCREEN, HOSP PERFORMED: Cocaine: NOT DETECTED

## 2010-11-18 LAB — HEPATITIS B SURFACE ANTIGEN: Hepatitis B Surface Ag: NEGATIVE

## 2010-11-18 LAB — TYPE AND SCREEN: Antibody Screen: NEGATIVE

## 2010-12-10 LAB — HERPES SIMPLEX VIRUS CULTURE: Culture: DETECTED

## 2010-12-10 LAB — RPR: RPR Ser Ql: NONREACTIVE

## 2010-12-18 ENCOUNTER — Emergency Department (HOSPITAL_COMMUNITY)
Admission: EM | Admit: 2010-12-18 | Discharge: 2010-12-18 | Disposition: A | Discharge status: Home / Self Care | Payer: BC Managed Care – PPO | Attending: Emergency Medicine | Admitting: Emergency Medicine

## 2010-12-18 ENCOUNTER — Emergency Department (HOSPITAL_COMMUNITY): Payer: BC Managed Care – PPO

## 2010-12-18 DIAGNOSIS — M25569 Pain in unspecified knee: Secondary | ICD-10-CM | POA: Insufficient documentation

## 2010-12-18 DIAGNOSIS — M1710 Unilateral primary osteoarthritis, unspecified knee: Secondary | ICD-10-CM | POA: Insufficient documentation

## 2010-12-18 DIAGNOSIS — I10 Essential (primary) hypertension: Secondary | ICD-10-CM | POA: Insufficient documentation

## 2010-12-18 DIAGNOSIS — M25469 Effusion, unspecified knee: Secondary | ICD-10-CM | POA: Insufficient documentation

## 2010-12-18 DIAGNOSIS — IMO0002 Reserved for concepts with insufficient information to code with codable children: Secondary | ICD-10-CM | POA: Insufficient documentation

## 2010-12-18 DIAGNOSIS — Z86711 Personal history of pulmonary embolism: Secondary | ICD-10-CM | POA: Insufficient documentation

## 2010-12-18 DIAGNOSIS — M76899 Other specified enthesopathies of unspecified lower limb, excluding foot: Secondary | ICD-10-CM | POA: Insufficient documentation

## 2010-12-18 DIAGNOSIS — M129 Arthropathy, unspecified: Secondary | ICD-10-CM | POA: Insufficient documentation

## 2011-01-27 ENCOUNTER — Other Ambulatory Visit: Payer: Self-pay | Admitting: Sports Medicine

## 2011-03-10 ENCOUNTER — Encounter (HOSPITAL_COMMUNITY): Payer: Self-pay

## 2011-03-10 ENCOUNTER — Emergency Department (HOSPITAL_COMMUNITY): Payer: No Typology Code available for payment source

## 2011-03-10 ENCOUNTER — Inpatient Hospital Stay (HOSPITAL_COMMUNITY)
Admission: EM | Admit: 2011-03-10 | Discharge: 2011-03-12 | DRG: 563 | Disposition: A | Discharge status: Home / Self Care | Payer: No Typology Code available for payment source | Attending: Surgery | Admitting: Surgery

## 2011-03-10 DIAGNOSIS — M6281 Muscle weakness (generalized): Secondary | ICD-10-CM

## 2011-03-10 DIAGNOSIS — S139XXA Sprain of joints and ligaments of unspecified parts of neck, initial encounter: Secondary | ICD-10-CM

## 2011-03-10 DIAGNOSIS — Z23 Encounter for immunization: Secondary | ICD-10-CM

## 2011-03-10 DIAGNOSIS — Z79899 Other long term (current) drug therapy: Secondary | ICD-10-CM

## 2011-03-10 DIAGNOSIS — F3289 Other specified depressive episodes: Secondary | ICD-10-CM | POA: Diagnosis present

## 2011-03-10 DIAGNOSIS — F17200 Nicotine dependence, unspecified, uncomplicated: Secondary | ICD-10-CM | POA: Diagnosis present

## 2011-03-10 DIAGNOSIS — F329 Major depressive disorder, single episode, unspecified: Secondary | ICD-10-CM | POA: Diagnosis present

## 2011-03-10 DIAGNOSIS — IMO0002 Reserved for concepts with insufficient information to code with codable children: Principal | ICD-10-CM | POA: Diagnosis present

## 2011-03-10 DIAGNOSIS — I10 Essential (primary) hypertension: Secondary | ICD-10-CM | POA: Diagnosis present

## 2011-03-10 DIAGNOSIS — Z981 Arthrodesis status: Secondary | ICD-10-CM

## 2011-03-10 DIAGNOSIS — M545 Low back pain, unspecified: Secondary | ICD-10-CM | POA: Diagnosis present

## 2011-03-10 DIAGNOSIS — Y92410 Unspecified street and highway as the place of occurrence of the external cause: Secondary | ICD-10-CM

## 2011-03-10 LAB — POCT I-STAT, CHEM 8
BUN: 19 mg/dL (ref 6–23)
Calcium, Ion: 1.1 mmol/L — ABNORMAL LOW (ref 1.12–1.32)
Chloride: 111 meq/L (ref 96–112)
Glucose, Bld: 93 mg/dL (ref 70–99)

## 2011-03-10 MED ORDER — IOHEXOL 300 MG/ML  SOLN
100.0000 mL | Freq: Once | INTRAMUSCULAR | Status: AC | PRN
  Administered 2011-03-10: 100 mL via INTRAVENOUS

## 2011-03-11 ENCOUNTER — Inpatient Hospital Stay (HOSPITAL_COMMUNITY): Payer: No Typology Code available for payment source

## 2011-03-11 LAB — BASIC METABOLIC PANEL
BUN: 15 mg/dL (ref 6–23)
Creatinine, Ser: 0.64 mg/dL (ref 0.50–1.35)
GFR calc Af Amer: >60 mL/min (ref >60)
GFR calc non Af Amer: >60 mL/min (ref >60)

## 2011-03-11 LAB — CBC
HCT: 40.1 % (ref 39.0–52.0)
MCH: 30.8 pg (ref 26.0–34.0)
MCHC: 34.7 g/dL (ref 30.0–36.0)
MCV: 88.9 fL (ref 78.0–100.0)
RDW: 12.3 % (ref 11.5–15.5)

## 2011-03-13 ENCOUNTER — Encounter (HOSPITAL_COMMUNITY): Payer: Self-pay | Admitting: Radiology
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
## 2011-03-20 ENCOUNTER — Encounter (INDEPENDENT_AMBULATORY_CARE_PROVIDER_SITE_OTHER): Payer: Self-pay | Admitting: Physician Assistant

## 2011-03-20 ENCOUNTER — Encounter (INDEPENDENT_AMBULATORY_CARE_PROVIDER_SITE_OTHER): Payer: Self-pay

## 2011-03-20 ENCOUNTER — Ambulatory Visit (INDEPENDENT_AMBULATORY_CARE_PROVIDER_SITE_OTHER): Payer: PRIVATE HEALTH INSURANCE | Admitting: Physician Assistant

## 2011-03-20 DIAGNOSIS — S139XXA Sprain of joints and ligaments of unspecified parts of neck, initial encounter: Secondary | ICD-10-CM

## 2011-03-20 DIAGNOSIS — IMO0002 Reserved for concepts with insufficient information to code with codable children: Secondary | ICD-10-CM

## 2011-03-20 MED ORDER — HYDROCODONE-ACETAMINOPHEN 5-500 MG PO TABS: 1.0000 | ORAL_TABLET | ORAL | Status: DC | PRN

## 2011-03-20 NOTE — Patient Instructions (Signed)
See your primary doctor if pain/numbness in L arm persists. It may be helpful to increase your gabapentin if this pain persists. You may return to work, full duty with no restrictions. Follow up with Trauma Service as needed
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
## 2011-08-29 ENCOUNTER — Ambulatory Visit (INDEPENDENT_AMBULATORY_CARE_PROVIDER_SITE_OTHER): Payer: PRIVATE HEALTH INSURANCE | Admitting: Family Medicine

## 2011-08-29 ENCOUNTER — Encounter: Payer: Self-pay | Admitting: Family Medicine

## 2011-08-29 VITALS — BP 130/85 | HR 69 | Temp 98.0°F | Ht 73.0 in | Wt 324.5 lb

## 2011-08-29 DIAGNOSIS — Z23 Encounter for immunization: Secondary | ICD-10-CM

## 2011-08-29 DIAGNOSIS — M5020 Other cervical disc displacement, unspecified cervical region: Secondary | ICD-10-CM

## 2011-08-29 DIAGNOSIS — A6000 Herpesviral infection of urogenital system, unspecified: Secondary | ICD-10-CM

## 2011-08-29 DIAGNOSIS — M25569 Pain in unspecified knee: Secondary | ICD-10-CM

## 2011-08-29 DIAGNOSIS — G4733 Obstructive sleep apnea (adult) (pediatric): Secondary | ICD-10-CM

## 2011-08-29 DIAGNOSIS — F17200 Nicotine dependence, unspecified, uncomplicated: Secondary | ICD-10-CM

## 2011-08-29 DIAGNOSIS — F329 Major depressive disorder, single episode, unspecified: Secondary | ICD-10-CM

## 2011-08-29 DIAGNOSIS — I10 Essential (primary) hypertension: Secondary | ICD-10-CM

## 2011-08-29 MED ORDER — GABAPENTIN 100 MG PO CAPS: 100.0000 mg | ORAL_CAPSULE | Freq: Three times a day (TID) | ORAL | Status: DC

## 2011-08-29 NOTE — Patient Instructions (Addendum)
Thanks for coming into clinic today. Please take all of your medications as prescribed. As discussed I would like for you to cut back on the Neurontin to 100mg  three times a day. You can always go back up to your current dose if needed.  I will have them sent over to the pharmacy later today. Please come back to see me in approximately 6 months or sooner if needed. Please follow up with the orthopedic surgeon to get your L knee replaced. Have a great day.
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
## 2011-08-29 NOTE — Assessment & Plan Note (Addendum)
Spent approximately discussing w/ pt. He is not interested in changing at this time. Pt enjoys smoking too much to quit despite having a father who died of lung cancer who smoked. Will address again at next appt

## 2011-09-01 ENCOUNTER — Encounter: Payer: Self-pay | Admitting: Family Medicine

## 2011-09-01 MED ORDER — CYCLOBENZAPRINE HCL 5 MG PO TABS: 5.0000 mg | ORAL_TABLET | Freq: Two times a day (BID) | ORAL | Status: DC | PRN

## 2011-09-01 MED ORDER — VALACYCLOVIR HCL 500 MG PO TABS: 500.0000 mg | ORAL_TABLET | Freq: Two times a day (BID) | ORAL | Status: DC

## 2011-09-01 MED ORDER — CITALOPRAM HYDROBROMIDE 20 MG PO TABS: 20.0000 mg | ORAL_TABLET | Freq: Every day | ORAL | Status: DC

## 2011-09-01 MED ORDER — HYDROCHLOROTHIAZIDE 25 MG PO TABS: 25.0000 mg | ORAL_TABLET | Freq: Every day | ORAL | Status: DC

## 2011-09-01 MED ORDER — TRAMADOL HCL 50 MG PO TABS: 50.0000 mg | ORAL_TABLET | Freq: Four times a day (QID) | ORAL | Status: DC | PRN

## 2011-09-01 NOTE — Assessment & Plan Note (Signed)
Pt reports being interested in doing a sleep study but does not have the funding right now. Pt would likely benefit as describes snoring, feeling un-rested in the morning, and a feeling of fullness in his neck when he wakes up that clears quickly.

## 2011-09-01 NOTE — Assessment & Plan Note (Signed)
Patient with continued left knee pain. Patient to followup with orthopedics for planned left knee replacement approximately 2 months. Will refill patient's tramadol at this time.

## 2011-09-01 NOTE — Assessment & Plan Note (Signed)
BP near pts baseline today. Will continue on HCTZ 25mg  at this time.

## 2011-09-01 NOTE — Assessment & Plan Note (Signed)
Will continue w/ Valtrex.

## 2011-09-01 NOTE — Assessment & Plan Note (Signed)
Pt appears well controlled on Celexa 20mg  Qday. No change at this time.

## 2011-09-01 NOTE — Assessment & Plan Note (Addendum)
Pt continues to have cervical neck pain. Well controlled w/ flexeril and neurontin. No change at this time. Will address options such as Pt at f/u appt.

## 2011-09-11 ENCOUNTER — Encounter: Payer: Self-pay | Admitting: Family Medicine

## 2011-09-11 ENCOUNTER — Ambulatory Visit (INDEPENDENT_AMBULATORY_CARE_PROVIDER_SITE_OTHER): Payer: PRIVATE HEALTH INSURANCE | Admitting: Family Medicine

## 2011-09-11 DIAGNOSIS — M549 Dorsalgia, unspecified: Secondary | ICD-10-CM

## 2011-09-11 DIAGNOSIS — I10 Essential (primary) hypertension: Secondary | ICD-10-CM

## 2011-09-11 DIAGNOSIS — M1710 Unilateral primary osteoarthritis, unspecified knee: Secondary | ICD-10-CM

## 2011-09-11 DIAGNOSIS — M546 Pain in thoracic spine: Secondary | ICD-10-CM

## 2011-09-11 DIAGNOSIS — M5020 Other cervical disc displacement, unspecified cervical region: Secondary | ICD-10-CM

## 2011-09-11 DIAGNOSIS — F17200 Nicotine dependence, unspecified, uncomplicated: Secondary | ICD-10-CM

## 2011-09-11 NOTE — Assessment & Plan Note (Signed)
Likely muscle spasm. May be some comorbid effects from degenerative disc disease of the spine. Pt to likely benefit from flexeril. Pt to fill Rx today. NSAID's also for relief.

## 2011-09-11 NOTE — Assessment & Plan Note (Signed)
BP OK today. No change in regimen

## 2011-09-11 NOTE — Progress Notes (Signed)
  Subjective:    Patient ID: Julian Lucas, male    DOB: 1976-08-08, 36 y.o.   MRN: [id number]  HPI CC: L upper back pain  L upper back pain: started a few days ago. Sudden onset. Originating around the shoulder blade and radiates medially and inferiorally. Crampy pain. No trauma     Review of Systems Denies: Denies Rash, Fever, HA, CP, SOB, syncope, lightheadedness, hematemasis, hematochezia, nausea, vomiting, diarrhea,         Objective:   Physical Exam  Musc: Decreased ROM due to pain. Left scapular and trapezius muscle tightness. Mild point tenderness over scapula.  Neuro: 3+ upper extremity strength bilat.        Assessment & Plan:

## 2011-09-11 NOTE — Assessment & Plan Note (Signed)
Continues to be uninterested in changing. Enjoys smoking.

## 2011-09-11 NOTE — Patient Instructions (Signed)
Your pain is likely from a muscle spasm. Massage and the flexeril I ordered for you at your last appointment should help this significantly . Please let me know if your pain gets worse. Please finish filling all of your medications and take them as prescribed. Please come back to the office if the pain should get worse. Have a great day and good luck with your surgery if I don't see you until after.

## 2011-09-11 NOTE — Assessment & Plan Note (Signed)
Pending knee surgery in ~1-2 months

## 2011-09-11 NOTE — Assessment & Plan Note (Signed)
Pt has not fill Rx for Flexeril. No change since prior exam. Pt to fill Flexeril today

## 2011-10-20 ENCOUNTER — Encounter: Payer: Self-pay | Admitting: Sports Medicine

## 2011-10-20 ENCOUNTER — Ambulatory Visit (HOSPITAL_COMMUNITY)
Admission: RE | Admit: 2011-10-20 | Discharge: 2011-10-20 | Disposition: A | Discharge status: Home / Self Care | Payer: PRIVATE HEALTH INSURANCE | Source: Ambulatory Visit | Attending: Family Medicine | Admitting: Family Medicine

## 2011-10-20 ENCOUNTER — Ambulatory Visit (INDEPENDENT_AMBULATORY_CARE_PROVIDER_SITE_OTHER): Payer: PRIVATE HEALTH INSURANCE | Admitting: Sports Medicine

## 2011-10-20 DIAGNOSIS — Z981 Arthrodesis status: Secondary | ICD-10-CM | POA: Insufficient documentation

## 2011-10-20 DIAGNOSIS — M5020 Other cervical disc displacement, unspecified cervical region: Secondary | ICD-10-CM

## 2011-10-20 DIAGNOSIS — M542 Cervicalgia: Secondary | ICD-10-CM | POA: Insufficient documentation

## 2011-10-20 DIAGNOSIS — M25519 Pain in unspecified shoulder: Secondary | ICD-10-CM | POA: Insufficient documentation

## 2011-10-20 MED ORDER — GABAPENTIN 300 MG PO CAPS: 300.0000 mg | ORAL_CAPSULE | Freq: Three times a day (TID) | ORAL | Status: DC

## 2011-10-20 MED ORDER — DIAZEPAM 5 MG PO TABS: 5.0000 mg | ORAL_TABLET | Freq: Every evening | ORAL | Status: AC | PRN

## 2011-10-20 MED ORDER — OXYCODONE-ACETAMINOPHEN 5-325 MG PO TABS: 1.0000 | ORAL_TABLET | Freq: Three times a day (TID) | ORAL | Status: AC | PRN

## 2011-10-20 NOTE — Patient Instructions (Signed)
It was nice to meet you today.  I would like to you go get x-rays of your neck as I believe that your symptoms are coming from your neck.  Please go over to [facility] for these.  For your pain and discomfort.  I am going to give you a prescription for Percocet to be taken every 4- 6 hours for the next 3 days.  You can continue to take this as needed after the next 3 days but I would like to try to get ahead of the pain.  This is not intended to be a long term medication and I anticipate your pain improving over the next couple of days.  I will also give you Valium for a stronger muscle relaxer and to help you sleep at night.  Once again this is not intended to be a long term medication.  To help with some of the nerve pain I would like to increase your Neurontin to 300mg  three times a day.  This may make you somewhat sedated to start but those symptoms should improve.  Please schedule a follow up with Dr. Konrad Dolores for either later this week or next to go over your results.  We will be in touch with you regarding the results of the X-ray.

## 2011-10-20 NOTE — Assessment & Plan Note (Signed)
Concerns for acute HNP.  Will send for plain film X-ray at this time.  Consider MRI if no improvement or worsening motor strength. Rx for Valium, Percocet - acute use only -  and increased Neurontin

## 2011-10-21 ENCOUNTER — Telehealth: Payer: Self-pay | Admitting: Family Medicine

## 2011-10-21 NOTE — Telephone Encounter (Signed)
Please contact patient about xray results.

## 2011-10-23 ENCOUNTER — Telehealth: Payer: Self-pay | Admitting: Family Medicine

## 2011-10-23 NOTE — Telephone Encounter (Signed)
Reviewed X-ray. Called pt to inform of results. No signs of impingement. Stable Previous fusion of C4-C6

## 2011-10-31 NOTE — Progress Notes (Signed)
HPI:  Julian Lucas is a 36 y.o. male presenting today for for evaluation of left shoulder, neck and and back pain.  He states that he has had chronic ongoing neck and back issues that he is an acute flare of the past 2-3 days. He has tried over-the-counter NSAIDs with no relief. He has not tried ice he has not tried heat. Reports this feels like when he has had neck issues the past including when he required surgery. Reports no numbness or tingling in his left upper extremity, no weakness, and no chest pain. he does report a radiating pain down to his left scapula. Sidebending and rotation to the left worsens his symptoms   ROS  Constitutional  decreased activity due to pain   Infectious  no fevers no chills   Resp  no recent cough or congestion   Trauma  denies     Past Medical Hx Reviewed: yes Medications Reviewed: yes Family History Reviewed: yes   PE: GENERAL:  African American male, examined in [facility].  In moderate discomfort, and no respiratory distress. Alert and appropriate and in situation. , HNEENT: AT/[place], MMM, no scleral icterus, EOMi THORAX: EXTREMITIES: Moves all 4 extremities spontaneously, without lateralization, >MSK/Osteopathic: Elevated left first rib, tenderness palpation over left levator scapula, anterior and middle scalenes, with acute muscle spasm. >NEURO: Positive Spurling's compression test on the left.

## 2011-11-20 ENCOUNTER — Telehealth: Payer: Self-pay | Admitting: Family Medicine

## 2011-11-20 NOTE — Telephone Encounter (Signed)
Patient is calling requesting a refill on Tramadol.  He said his pharmacy has requested it over a week ago and have not heard back.  He uses [facility] [address].

## 2011-11-21 ENCOUNTER — Other Ambulatory Visit: Payer: Self-pay | Admitting: Family Medicine

## 2011-11-21 DIAGNOSIS — M5020 Other cervical disc displacement, unspecified cervical region: Secondary | ICD-10-CM

## 2011-11-21 DIAGNOSIS — M25569 Pain in unspecified knee: Secondary | ICD-10-CM

## 2011-11-21 MED ORDER — TRAMADOL HCL 50 MG PO TABS: 50.0000 mg | ORAL_TABLET | Freq: Four times a day (QID) | ORAL | Status: DC | PRN

## 2011-11-21 NOTE — Progress Notes (Signed)
Discussed procedure/plan for narcotic refills w/ pt. States he understands and will come into office for future refills. Last refilled tramadol nearly 3 months ago. Will refill half of normal Rx (30pills). Pt surgery has been postponed due to financing.

## 2012-01-09 ENCOUNTER — Emergency Department (HOSPITAL_COMMUNITY)
Admission: EM | Admit: 2012-01-09 | Discharge: 2012-01-10 | Disposition: A | Discharge status: Home / Self Care | Payer: PRIVATE HEALTH INSURANCE | Attending: Emergency Medicine | Admitting: Emergency Medicine

## 2012-01-09 ENCOUNTER — Encounter (HOSPITAL_COMMUNITY): Payer: Self-pay | Admitting: *Deleted

## 2012-01-09 DIAGNOSIS — M5430 Sciatica, unspecified side: Secondary | ICD-10-CM | POA: Insufficient documentation

## 2012-01-09 DIAGNOSIS — I10 Essential (primary) hypertension: Secondary | ICD-10-CM | POA: Insufficient documentation

## 2012-01-09 DIAGNOSIS — R109 Unspecified abdominal pain: Secondary | ICD-10-CM | POA: Insufficient documentation

## 2012-01-09 DIAGNOSIS — Z79899 Other long term (current) drug therapy: Secondary | ICD-10-CM | POA: Insufficient documentation

## 2012-01-09 DIAGNOSIS — F17200 Nicotine dependence, unspecified, uncomplicated: Secondary | ICD-10-CM | POA: Insufficient documentation

## 2012-01-09 DIAGNOSIS — M545 Low back pain, unspecified: Secondary | ICD-10-CM | POA: Insufficient documentation

## 2012-01-09 DIAGNOSIS — M5412 Radiculopathy, cervical region: Secondary | ICD-10-CM

## 2012-01-09 NOTE — ED Notes (Signed)
Pt States left side pain, that goes to her left arm. Pt states pain intermittant and started last night.

## 2012-01-10 MED ORDER — TRAMADOL HCL 50 MG PO TABS: 50.0000 mg | ORAL_TABLET | Freq: Four times a day (QID) | ORAL | Status: DC | PRN

## 2012-01-10 NOTE — ED Notes (Signed)
D/c instructions reviewed w/ pt and family - pt and family deny any further questions or concerns at present.\ 

## 2012-01-10 NOTE — ED Provider Notes (Signed)
History     CSN: [id number]  Arrival date & time 01/09/12  2236   First MD Initiated Contact with Patient 01/09/12 2347      Chief Complaint  Patient presents with  . Flank Pain    (Consider location/radiation/quality/duration/timing/severity/associated sxs/prior treatment) HPI Comments: Patient with a history of cervical fusion comes in today with a chief complaint of neck pain, lower back pain, and numbness of his left arm.  Patient has been seen by his PCP numerous times recently for this same complaint.  He is currently on Neurontin and was taking Tramadol, which helped.  He reports that he ran out of the Tramadol three days ago.  Pain of the lower back and neck became worse today.  No acute injury or trauma.  He reports that the pain that he is feel today is similar to the pain that he has had in the past.    Patient is a 36 y.o. male presenting with back pain. The history is provided by the patient.  Back Pain  The problem occurs constantly. The problem has been gradually worsening. The pain is present in the lumbar spine. The symptoms are aggravated by bending and twisting. Associated symptoms include numbness and tingling. Pertinent negatives include no chest pain, no fever, no abdominal pain, no bowel incontinence, no perianal numbness, no bladder incontinence, no dysuria, no leg pain and no weakness.    Past Medical History  Diagnosis Date  . Hypertension     Past Surgical History  Procedure Date  . Knee surgery   . Neck surgery   . Tonsilectomy, adenoidectomy, bilateral myringotomy and tubes     Family History  Problem Relation Age of Onset  . Lung cancer Father     History  Substance Use Topics  . Smoking status: Current Everyday Smoker -- 0.5 packs/day    Types: Cigarettes  . Smokeless tobacco: Not on file  . Alcohol Use: Yes      Review of Systems  Constitutional: Negative for fever and chills.  Cardiovascular: Negative for chest pain.    Gastrointestinal: Negative for abdominal pain and bowel incontinence.  Genitourinary: Negative for bladder incontinence, dysuria and decreased urine volume.       No bowel or bladder incontinence  Musculoskeletal: Positive for back pain.  Neurological: Positive for tingling and numbness. Negative for weakness.  All other systems reviewed and are negative.    Allergies  Review of patient's allergies indicates no known allergies.  Home Medications   Current Outpatient Rx  Name Route Sig Dispense Refill  . GABAPENTIN 300 MG PO CAPS Oral Take 300 mg by mouth 4 (four) times daily.    Marland Kitchen HYDROCHLOROTHIAZIDE 25 MG PO TABS Oral Take 1 tablet (25 mg total) by mouth daily. 30 tablet 11    BP 157/88  Pulse 67  Temp(Src) 98.4 F (36.9 C) (Oral)  Resp 16  SpO2 99%  Physical Exam  Nursing note and vitals reviewed. Constitutional: He appears well-developed and well-nourished. No distress.  HENT:  Head: Normocephalic and atraumatic.  Mouth/Throat: Oropharynx is clear and moist.  Eyes: EOM are normal. Pupils are equal, round, and reactive to light.  Neck: Normal range of motion. Neck supple.  Cardiovascular: Normal rate, regular rhythm, normal heart sounds and intact distal pulses.   Pulmonary/Chest: Effort normal and breath sounds normal.  Musculoskeletal: Normal range of motion.  Neurological: He is alert. He has normal strength. No sensory deficit. Gait normal.  Reflex Scores:      Brachioradialis  reflexes are 2+ on the right side and 2+ on the left side.      Patellar reflexes are 2+ on the right side and 2+ on the left side.      Achilles reflexes are 2+ on the right side and 2+ on the left side.      Grip strength 5/5 bilaterally  Skin: Skin is warm and dry. He is not diaphoretic. No erythema.  Psychiatric: He has a normal mood and affect.    ED Course  Procedures (including critical care time)  Labs Reviewed - No data to display No results found.   No diagnosis  found.    MDM  Patient with back pain.  No neurological deficits and normal neuro exam.  Patient can walk but states is painful.  No loss of bowel or bladder control.  No concern for cauda equina.  No fever, night sweats, weight loss, h/o cancer, IVDU.  RICE protocol and pain medicine indicated and discussed with patient. Patient also with chronic neck pain and numbness of the left arm.  Patient has been on Tramadol, but recently ran out of medication.  Will give patient Rx for Tramadol.  Patient instructed to follow up with PCP.  Return precautions discussed.        Pascal Lux [place], PA-C 01/11/12 1301

## 2012-01-10 NOTE — ED Notes (Signed)
Pt reports left mid back/flank pain that began today - pt denies any urinary symptoms or mechanism of injury. Pt in no acute distress at present, resting comfortably on stretcher. Family at bedside x1.

## 2012-01-13 NOTE — ED Provider Notes (Signed)
Medical screening examination/treatment/procedure(s) were performed by non-physician practitioner and as supervising physician I was immediately available for consultation/collaboration.  Antar Milks, MD 01/13/12 0202 

## 2012-02-24 ENCOUNTER — Other Ambulatory Visit: Payer: Self-pay | Admitting: Family Medicine

## 2012-02-24 ENCOUNTER — Ambulatory Visit: Payer: Self-pay | Admitting: Family Medicine

## 2012-02-24 DIAGNOSIS — M5020 Other cervical disc displacement, unspecified cervical region: Secondary | ICD-10-CM

## 2012-02-24 DIAGNOSIS — I10 Essential (primary) hypertension: Secondary | ICD-10-CM

## 2012-02-24 MED ORDER — CYCLOBENZAPRINE HCL 5 MG PO TABS: 5.0000 mg | ORAL_TABLET | Freq: Two times a day (BID) | ORAL | Status: DC | PRN

## 2012-02-24 MED ORDER — HYDROCHLOROTHIAZIDE 25 MG PO TABS: 25.0000 mg | ORAL_TABLET | Freq: Every day | ORAL | Status: DC

## 2012-02-24 MED ORDER — TRAMADOL HCL 50 MG PO TABS: 50.0000 mg | ORAL_TABLET | Freq: Four times a day (QID) | ORAL | Status: DC | PRN

## 2012-02-24 NOTE — Telephone Encounter (Signed)
Patients appt for this afternoon was rescheduled due to overcrowding in clinic.  He was coming in for refills and was rescheduled for 7/18 but is going to need refills today or tomorrow.  Patient needs refills on HCTZ, Tramadol, Cyclobenzaprine.  They need to go to [facility] on [address].

## 2012-03-11 ENCOUNTER — Ambulatory Visit: Payer: Self-pay | Admitting: Family Medicine

## 2012-03-19 ENCOUNTER — Encounter: Payer: Self-pay | Admitting: Family Medicine

## 2012-03-19 ENCOUNTER — Ambulatory Visit (INDEPENDENT_AMBULATORY_CARE_PROVIDER_SITE_OTHER): Payer: PRIVATE HEALTH INSURANCE | Admitting: Family Medicine

## 2012-03-19 VITALS — BP 145/97 | HR 90 | Temp 99.1°F | Ht 73.0 in | Wt 322.0 lb

## 2012-03-19 DIAGNOSIS — E669 Obesity, unspecified: Secondary | ICD-10-CM

## 2012-03-19 DIAGNOSIS — M5020 Other cervical disc displacement, unspecified cervical region: Secondary | ICD-10-CM

## 2012-03-19 DIAGNOSIS — I10 Essential (primary) hypertension: Secondary | ICD-10-CM

## 2012-03-19 DIAGNOSIS — G4733 Obstructive sleep apnea (adult) (pediatric): Secondary | ICD-10-CM

## 2012-03-19 MED ORDER — LISINOPRIL 5 MG PO TABS: 5.0000 mg | ORAL_TABLET | Freq: Every day | ORAL | Status: DC

## 2012-03-19 MED ORDER — TRAMADOL HCL 50 MG PO TABS: 50.0000 mg | ORAL_TABLET | Freq: Four times a day (QID) | ORAL | Status: DC | PRN

## 2012-03-19 MED ORDER — HYDROCHLOROTHIAZIDE 12.5 MG PO TABS: 12.5000 mg | ORAL_TABLET | Freq: Every day | ORAL | Status: DC

## 2012-03-19 MED ORDER — GABAPENTIN 300 MG PO CAPS: 300.0000 mg | ORAL_CAPSULE | Freq: Four times a day (QID) | ORAL | Status: DC

## 2012-03-19 NOTE — Patient Instructions (Addendum)
Thank you for coming in today Please continue taking the Tramadol and Gabapentin for your pain. Please continue sseing the doctors at sports medicine Please decrease your HCTZ dose by half and start taking Lisinopril Please come in for a blood pressure check sometime next week after you have started taking the lisinopril Please have your blood drawn today. I will let you know if anything comes back abnormal.

## 2012-03-20 LAB — BASIC METABOLIC PANEL
CO2: 27 mEq/L (ref 19–32)
Calcium: 9.4 mg/dL (ref 8.4–10.5)
Creat: 0.82 mg/dL (ref 0.50–1.35)
Sodium: 143 mEq/L (ref 135–145)

## 2012-03-23 NOTE — Progress Notes (Signed)
  Subjective:    Patient ID: Julian Lucas, male    DOB: [birth year], 36 y.o.   MRN: [id number]  HPI  CC: Pain, Med refill  Chronic pain: Has been a problem for many years. Awaiting furhter funding for surgery by ortho for severe knee problems. Well controlled on neurontin and tramadol. Takes before work and able to work. Tried steroid injection w/ only 2wks benefit. Seeing sports Med. W/ slight improvement. Agrevated by activity, and weather changes, improved w/ rest. Denies any bony growth, fever, wt loss, night sweats, systemic joint invovlement.   HTN: Patient with history of hypertension: Started on hydrochlorothiazide in January. Patient's been out of medication for at least 4 days. Hypertensive in office today to 145/97. Patient states she ran out of his medication and does need to refilled. Hydrochlorothiazide well tolerated per patient. Denies chest pain, shortness of breath, headache, dizziness, syncope, palpitations.  Obstructive sleep apnea: Patient reports history of snoring at night and CPAP use since that time he was a child. Was started on CPAP as a youth but did "grew out of it ". Patient currently denies feeling tired throughout the day. Wakes up feeling rested. Occasional snoring when overly tired per patient's family. No apneic episodes per patient per family.   Review of Systems Per history of present illness    Objective:   Physical Exam        Assessment & Plan:

## 2012-03-24 ENCOUNTER — Encounter: Payer: Self-pay | Admitting: Family Medicine

## 2012-03-24 NOTE — Assessment & Plan Note (Addendum)
Refill HCTZ. Patient to return sometime next week for nursing blood pressure check.

## 2012-03-24 NOTE — Assessment & Plan Note (Signed)
Patient currently awaiting surgical intervention for left knee. Multiple previous surgeries. Refill tramadol and Neurontin. Continue meeting with sports medicine.

## 2012-03-24 NOTE — Assessment & Plan Note (Signed)
No signs or symptoms of OSA at this time. Not using CPAP. This is a different history that was previously reported in January. No further intervention at this time as patient does not feel this is an issue and does not have funding for further intervention.

## 2012-08-10 ENCOUNTER — Other Ambulatory Visit: Payer: Self-pay | Admitting: Family Medicine

## 2012-10-12 ENCOUNTER — Emergency Department (HOSPITAL_COMMUNITY)
Admission: EM | Admit: 2012-10-12 | Discharge: 2012-10-12 | Disposition: A | Discharge status: Home / Self Care | Payer: PRIVATE HEALTH INSURANCE | Attending: Emergency Medicine | Admitting: Emergency Medicine

## 2012-10-12 ENCOUNTER — Encounter (HOSPITAL_COMMUNITY): Payer: Self-pay | Admitting: Emergency Medicine

## 2012-10-12 DIAGNOSIS — R51 Headache: Secondary | ICD-10-CM | POA: Insufficient documentation

## 2012-10-12 DIAGNOSIS — Z8619 Personal history of other infectious and parasitic diseases: Secondary | ICD-10-CM | POA: Insufficient documentation

## 2012-10-12 DIAGNOSIS — I10 Essential (primary) hypertension: Secondary | ICD-10-CM | POA: Insufficient documentation

## 2012-10-12 DIAGNOSIS — Z86711 Personal history of pulmonary embolism: Secondary | ICD-10-CM | POA: Insufficient documentation

## 2012-10-12 DIAGNOSIS — M542 Cervicalgia: Secondary | ICD-10-CM | POA: Insufficient documentation

## 2012-10-12 DIAGNOSIS — F17200 Nicotine dependence, unspecified, uncomplicated: Secondary | ICD-10-CM | POA: Insufficient documentation

## 2012-10-12 DIAGNOSIS — Z8739 Personal history of other diseases of the musculoskeletal system and connective tissue: Secondary | ICD-10-CM | POA: Insufficient documentation

## 2012-10-12 DIAGNOSIS — Z79899 Other long term (current) drug therapy: Secondary | ICD-10-CM | POA: Insufficient documentation

## 2012-10-12 MED ORDER — IBUPROFEN 800 MG PO TABS: 800.0000 mg | ORAL_TABLET | Freq: Three times a day (TID) | ORAL | Status: DC

## 2012-10-12 MED ORDER — CYCLOBENZAPRINE HCL 10 MG PO TABS: 10.0000 mg | ORAL_TABLET | Freq: Two times a day (BID) | ORAL | Status: DC | PRN

## 2012-10-12 NOTE — ED Provider Notes (Signed)
History     CSN: [id number]  Arrival date & time 10/12/12  1255   First MD Initiated Contact with Patient 10/12/12 1355      Chief Complaint  Patient presents with  . Neck Pain  . Headache    (Consider location/radiation/quality/duration/timing/severity/associated sxs/prior treatment) Patient is a 37 y.o. male presenting with neck pain and headaches. The history is provided by the patient.  Neck Pain Pain location:  R side Quality:  Aching Radiates to: Radiates into occipital scalp. Associated symptoms: headaches   Associated symptoms: no fever, no numbness and no weakness   Associated symptoms comment:  He is having right worse than left sided neck pain for the past 4 days. No injury. He became concerned when the pain started causing an occipital headache. No fever, N, V, rash or feeling of illness.  He has a history of previous discectomy 2010 for symptoms involving left arm. No radiation into the arm currently. Headache Associated symptoms: neck pain   Associated symptoms: no fever, no nausea and no numbness     Past Medical History  Diagnosis Date  . Hypertension   . HERPES GENITALIS 11/21/2009    Qualifier: Diagnosis of  By: Constance Goltz MD, Molli Hazard    . PULMONARY EMBOLISM, HX OF 11/21/2009    Qualifier: Diagnosis of  By: Lelon Perla MD, Vickki Muff    . UNEQUAL LEG LENGTH 08/09/2010    Qualifier: Diagnosis of  By: Alvester Morin MD, Viviann Spare    . DEGENERATIVE JOINT DISEASE, LEFT KNEE 09/09/2010    Qualifier: Diagnosis of  By: Darrick Penna MD, KARL      Past Surgical History  Procedure Laterality Date  . Knee surgery    . Neck surgery    . Tonsilectomy, adenoidectomy, bilateral myringotomy and tubes      Family History  Problem Relation Age of Onset  . Lung cancer Father     History  Substance Use Topics  . Smoking status: Current Every Day Smoker -- 0.30 packs/day    Types: Cigarettes  . Smokeless tobacco: Not on file  . Alcohol Use: Yes      Review of Systems  Constitutional:  Negative for fever and chills.  HENT: Positive for neck pain.   Respiratory: Negative.   Cardiovascular: Negative.   Gastrointestinal: Negative.  Negative for nausea.  Musculoskeletal:       See HPI  Skin: Negative.  Negative for rash.  Neurological: Positive for headaches. Negative for weakness and numbness.    Allergies  Review of patient's allergies indicates no known allergies.  Home Medications   Current Outpatient Rx  Name  Route  Sig  Dispense  Refill  . acetaminophen (TYLENOL) 500 MG tablet   Oral   Take 500 mg by mouth every 6 (six) hours as needed (for headache).         . gabapentin (NEURONTIN) 300 MG capsule   Oral   Take 300 mg by mouth 2 (two) times daily.         Marland Kitchen lisinopril (PRINIVIL,ZESTRIL) 5 MG tablet   Oral   Take 5 mg by mouth daily.         . traMADol (ULTRAM) 50 MG tablet   Oral   Take 50 mg by mouth every 6 (six) hours as needed for pain.           BP 142/100  Pulse 74  Temp(Src) 98.7 F (37.1 C) (Oral)  Resp 20  SpO2 98%  Physical Exam  Constitutional: He is oriented to person,  place, and time. He appears well-developed and well-nourished.  HENT:  Head: Normocephalic.  Mouth/Throat: Oropharynx is clear and moist.  Neck: Normal range of motion. Neck supple.  Cardiovascular: Normal rate and regular rhythm.   Pulmonary/Chest: Effort normal and breath sounds normal.  Abdominal: Soft. Bowel sounds are normal. There is no tenderness. There is no rebound and no guarding.  Musculoskeletal: Normal range of motion.  No midline cervical tenderness. Mild right paracervical tenderness without swelling. Tenderness extends to occipital region on right. No swelling. FROM of neck. No meningeal signs.   Neurological: He is alert and oriented to person, place, and time.  Skin: Skin is warm and dry. No rash noted.  Psychiatric: He has a normal mood and affect.    ED Course  Procedures (including critical care time)  Labs Reviewed - No data  to display No results found.   No diagnosis found.  1. Neck pain 2. Tension headache  MDM  Musculoskeletal neck pain without symptoms of acute infectious or neurologic process.         Arnoldo Hooker, PA-C 10/12/12 1513

## 2012-10-12 NOTE — ED Provider Notes (Signed)
Medical screening examination/treatment/procedure(s) were performed by non-physician practitioner and as supervising physician I was immediately available for consultation/collaboration.   Carleene Cooper III, MD 10/12/12 [phone]

## 2012-10-12 NOTE — ED Notes (Signed)
Pt c/o neck pain x 4 days with pain in head; pt denies injury

## 2012-10-15 ENCOUNTER — Ambulatory Visit (INDEPENDENT_AMBULATORY_CARE_PROVIDER_SITE_OTHER): Payer: PRIVATE HEALTH INSURANCE | Admitting: Family Medicine

## 2012-10-15 ENCOUNTER — Encounter: Payer: Self-pay | Admitting: Family Medicine

## 2012-10-15 VITALS — BP 113/78 | HR 82 | Ht 73.0 in | Wt 336.0 lb

## 2012-10-15 DIAGNOSIS — M2550 Pain in unspecified joint: Secondary | ICD-10-CM

## 2012-10-15 DIAGNOSIS — I10 Essential (primary) hypertension: Secondary | ICD-10-CM

## 2012-10-15 DIAGNOSIS — E669 Obesity, unspecified: Secondary | ICD-10-CM

## 2012-10-15 DIAGNOSIS — G8929 Other chronic pain: Secondary | ICD-10-CM

## 2012-10-15 LAB — LDL CHOLESTEROL, DIRECT: Direct LDL: 120 mg/dL — ABNORMAL HIGH

## 2012-10-15 NOTE — Progress Notes (Signed)
Julian Lucas is a 37 y.o. male who presents to [facility] today for ED f/u.   HA: Still w/ HA from shooting pains in neck. Started 7 days ago. Neck surgery for ruptured disc several years ago. No recent trauma. May have slept on pillow wrong. Flexeril and ibuprofen w/o much benefit. Improves w/ rest. Stiff w/ certain movements. Denies LOC, fever, AMS.  HLD: Denies CP, SOB, syncope. No previous lipids. Eats a pretty balanced diet but a lot of sweets adn some fried foods.   Obesityt: wt up today 14lbs in abt 17mo. No real exercise secondayr to knee pain. Seeing surgeon next week to discuss options or knee pain.  Discussed healthy diet.    The following portions of the patient's history were reviewed and updated as appropriate: allergies, current medications, past medical history, and social history, and problem list.  Patient is a smoker  Past Medical History  Diagnosis Date  . Hypertension   . HERPES GENITALIS 11/21/2009    Qualifier: Diagnosis of  By: Constance Goltz MD, Molli Hazard    . PULMONARY EMBOLISM, HX OF 11/21/2009    Qualifier: Diagnosis of  By: Lelon Perla MD, Vickki Muff    . UNEQUAL LEG LENGTH 08/09/2010    Qualifier: Diagnosis of  By: Alvester Morin MD, Viviann Spare    . DEGENERATIVE JOINT DISEASE, LEFT KNEE 09/09/2010    Qualifier: Diagnosis of  By: FIELDS MD, KARL      ROS as above otherwise neg.    Medications reviewed. Current Outpatient Prescriptions  Medication Sig Dispense Refill  . acetaminophen (TYLENOL) 500 MG tablet Take 500 mg by mouth every 6 (six) hours as needed (for headache).      . cyclobenzaprine (FLEXERIL) 10 MG tablet Take 1 tablet (10 mg total) by mouth 2 (two) times daily as needed for muscle spasms.  20 tablet  0  . gabapentin (NEURONTIN) 300 MG capsule Take 300 mg by mouth 2 (two) times daily.      Marland Kitchen ibuprofen (ADVIL,MOTRIN) 800 MG tablet Take 1 tablet (800 mg total) by mouth 3 (three) times daily.  21 tablet  0  . lisinopril (PRINIVIL,ZESTRIL) 5 MG tablet Take 5 mg by mouth daily.      .  traMADol (ULTRAM) 50 MG tablet Take 50 mg by mouth every 6 (six) hours as needed for pain.       No current facility-administered medications for this visit.    Exam:  BP 113/78  Pulse 82  Ht 6\' 1"  (1.854 m)  Wt 336 lb (152.409 kg)  BMI 44.34 kg/m2 Gen: Well NAD, Obese HEENT: EOMI,  MMM, no frontal or maxillary sinus pressure PERRL Lungs: CTABL Nl WOB Heart: RRR no MRG MSK: + Spurlings test to the L. L perispinal muscle, flexion and extension of the neck normal, L rotation to 90 degrees and R rotation to 60 degrees. tightness of cervical spine Abd: NABS, NT, ND Neuro: Cn grossly intact, ambulation w/ o difficulty  No results found for this or any previous visit (from the past 72 hour(s)).

## 2012-10-15 NOTE — Assessment & Plan Note (Addendum)
Pt to improve diet.  Exercise limited by mobility Seeing orho soon Direct LDL today

## 2012-10-15 NOTE — Assessment & Plan Note (Signed)
At goal. No change  Continue current therapy

## 2012-10-15 NOTE — Patient Instructions (Signed)
You are doing well Please continue your current medications as prescribed Please do the neck exercises as this will help with your neck strain and headache.  Follow up with the surgeon Come see me in 6 months or sooner if needed.     Cervical Strain and Sprain (Whiplash) with Rehab Cervical strain and sprains are injuries that commonly occur with "whiplash" injuries. Whiplash occurs when the neck is forcefully whipped backward or forward, such as during a motor vehicle accident. The muscles, ligaments, tendons, discs and nerves of the neck are susceptible to injury when this occurs. SYMPTOMS   Pain or stiffness in the front and/or back of neck  Symptoms may present immediately or up to 24 hours after injury.  Dizziness, headache, nausea and vomiting.  Muscle spasm with soreness and stiffness in the neck.  Tenderness and swelling at the injury site. CAUSES  Whiplash injuries often occur during contact sports or motor vehicle accidents.  RISK INCREASES WITH:  Osteoarthritis of the spine.  Situations that make head or neck accidents or trauma more likely.  High-risk sports (football, rugby, wrestling, hockey, auto racing, gymnastics, diving, contact karate or boxing).  Poor strength and flexibility of the neck.  Previous neck injury.  Poor tackling technique.  Improperly fitted or padded equipment. PREVENTION  Learn and use proper technique (avoid tackling with the head, spearing and head-butting; use proper falling techniques to avoid landing on the head).  Warm up and stretch properly before activity.  Maintain physical fitness:  Strength, flexibility and endurance.  Cardiovascular fitness.  Wear properly fitted and padded protective equipment, such as padded soft collars, for participation in contact sports. PROGNOSIS  Recovery for cervical strain and sprain injuries is dependent on the extent of the injury. These injuries are usually curable in 1 week to 3  months with appropriate treatment.  RELATED COMPLICATIONS   Temporary numbness and weakness may occur if the nerve roots are damaged, and this may persist until the nerve has completely healed.  Chronic pain due to frequent recurrence of symptoms.  Prolonged healing, especially if activity is resumed too soon (before complete recovery). TREATMENT  Treatment initially involves the use of ice and medication to help reduce pain and inflammation. It is also important to perform strengthening and stretching exercises and modify activities that worsen symptoms so the injury does not get worse. These exercises may be performed at home or with a therapist. For patients who experience severe symptoms, a soft padded collar may be recommended to be worn around the neck.  Improving your posture may help reduce symptoms. Posture improvement includes pulling your chin and abdomen in while sitting or standing. If you are sitting, sit in a firm chair with your buttocks against the back of the chair. While sleeping, try replacing your pillow with a small towel rolled to 2 inches in diameter, or use a cervical pillow or soft cervical collar. Poor sleeping positions delay healing.  For patients with nerve root damage, which causes numbness or weakness, the use of a cervical traction apparatus may be recommended. Surgery is rarely necessary for these injuries. However, cervical strain and sprains that are present at birth (congenital) may require surgery. MEDICATION   If pain medication is necessary, nonsteroidal anti-inflammatory medications, such as aspirin and ibuprofen, or other minor pain relievers, such as acetaminophen, are often recommended.  Do not take pain medication for 7 days before surgery.  Prescription pain relievers may be given if deemed necessary by your caregiver. Use only as  directed and only as much as you need. HEAT AND COLD:   Cold treatment (icing) relieves pain and reduces inflammation.  Cold treatment should be applied for 10 to 15 minutes every 2 to 3 hours for inflammation and pain and immediately after any activity that aggravates your symptoms. Use ice packs or an ice massage.  Heat treatment may be used prior to performing the stretching and strengthening activities prescribed by your caregiver, physical therapist, or athletic trainer. Use a heat pack or a warm soak. SEEK MEDICAL CARE IF:   Symptoms get worse or do not improve in 2 weeks despite treatment.  New, unexplained symptoms develop (drugs used in treatment may produce side effects). EXERCISES RANGE OF MOTION (ROM) AND STRETCHING EXERCISES - Cervical Strain and Sprain These exercises may help you when beginning to rehabilitate your injury. In order to successfully resolve your symptoms, you must improve your posture. These exercises are designed to help reduce the forward-head and rounded-shoulder posture which contributes to this condition. Your symptoms may resolve with or without further involvement from your physician, physical therapist or athletic trainer. While completing these exercises, remember:   Restoring tissue flexibility helps normal motion to return to the joints. This allows healthier, less painful movement and activity.  An effective stretch should be held for at least 20 seconds, although you may need to begin with shorter hold times for comfort.  A stretch should never be painful. You should only feel a gentle lengthening or release in the stretched tissue. STRETCH- Axial Extensors  Lie on your back on the floor. You may bend your knees for comfort. Place a rolled up hand towel or dish towel, about 2 inches in diameter, under the part of your head that makes contact with the floor.  Gently tuck your chin, as if trying to make a "double chin," until you feel a gentle stretch at the base of your head.  Hold __________ seconds. Repeat __________ times. Complete this exercise __________ times per  day.  STRETECH - Axial Extension   Stand or sit on a firm surface. Assume a good posture: chest up, shoulders drawn back, abdominal muscles slightly tense, knees unlocked (if standing) and feet hip width apart.  Slowly retract your chin so your head slides back and your chin slightly lowers.Continue to look straight ahead.  You should feel a gentle stretch in the back of your head. Be certain not to feel an aggressive stretch since this can cause headaches later.  Hold for __________ seconds. Repeat __________ times. Complete this exercise __________ times per day. STRETCH  Cervical Side Bend   Stand or sit on a firm surface. Assume a good posture: chest up, shoulders drawn back, abdominal muscles slightly tense, knees unlocked (if standing) and feet hip width apart.  Without letting your nose or shoulders move, slowly tip your right / left ear to your shoulder until your feel a gentle stretch in the muscles on the opposite side of your neck.  Hold __________ seconds. Repeat __________ times. Complete this exercise __________ times per day. STRETCH  Cervical Rotators   Stand or sit on a firm surface. Assume a good posture: chest up, shoulders drawn back, abdominal muscles slightly tense, knees unlocked (if standing) and feet hip width apart.  Keeping your eyes level with the ground, slowly turn your head until you feel a gentle stretch along the back and opposite side of your neck.  Hold __________ seconds. Repeat __________ times. Complete this exercise __________ times per day.  RANGE OF MOTION - Neck Circles   Stand or sit on a firm surface. Assume a good posture: chest up, shoulders drawn back, abdominal muscles slightly tense, knees unlocked (if standing) and feet hip width apart.  Gently roll your head down and around from the back of one shoulder to the back of the other. The motion should never be forced or painful.  Repeat the motion 10-20 times, or until you feel the neck  muscles relax and loosen. Repeat __________ times. Complete the exercise __________ times per day. STRENGTHENING EXERCISES - Cervical Strain and Sprain These exercises may help you when beginning to rehabilitate your injury. They may resolve your symptoms with or without further involvement from your physician, physical therapist or athletic trainer. While completing these exercises, remember:   Muscles can gain both the endurance and the strength needed for everyday activities through controlled exercises.  Complete these exercises as instructed by your physician, physical therapist or athletic trainer. Progress the resistance and repetitions only as guided.  You may experience muscle soreness or fatigue, but the pain or discomfort you are trying to eliminate should never worsen during these exercises. If this pain does worsen, stop and make certain you are following the directions exactly. If the pain is still present after adjustments, discontinue the exercise until you can discuss the trouble with your clinician. STRENGTH Cervical Flexors, Isometric  Face a wall, standing about 6 inches away. Place a small pillow, a ball about 6-8 inches in diameter, or a folded towel between your forehead and the wall.  Slightly tuck your chin and gently push your forehead into the soft object. Push only with mild to moderate intensity, building up tension gradually. Keep your jaw and forehead relaxed.  Hold 10 to 20 seconds. Keep your breathing relaxed.  Release the tension slowly. Relax your neck muscles completely before you start the next repetition. Repeat __________ times. Complete this exercise __________ times per day. STRENGTH- Cervical Lateral Flexors, Isometric   Stand about 6 inches away from a wall. Place a small pillow, a ball about 6-8 inches in diameter, or a folded towel between the side of your head and the wall.  Slightly tuck your chin and gently tilt your head into the soft object.  Push only with mild to moderate intensity, building up tension gradually. Keep your jaw and forehead relaxed.  Hold 10 to 20 seconds. Keep your breathing relaxed.  Release the tension slowly. Relax your neck muscles completely before you start the next repetition. Repeat __________ times. Complete this exercise __________ times per day. STRENGTH  Cervical Extensors, Isometric   Stand about 6 inches away from a wall. Place a small pillow, a ball about 6-8 inches in diameter, or a folded towel between the back of your head and the wall.  Slightly tuck your chin and gently tilt your head back into the soft object. Push only with mild to moderate intensity, building up tension gradually. Keep your jaw and forehead relaxed.  Hold 10 to 20 seconds. Keep your breathing relaxed.  Release the tension slowly. Relax your neck muscles completely before you start the next repetition. Repeat __________ times. Complete this exercise __________ times per day. POSTURE AND BODY MECHANICS CONSIDERATIONS - Cervical Strain and Sprain Keeping correct posture when sitting, standing or completing your activities will reduce the stress put on different body tissues, allowing injured tissues a chance to heal and limiting painful experiences. The following are general guidelines for improved posture. Your physician or physical therapist  will provide you with any instructions specific to your needs. While reading these guidelines, remember:  The exercises prescribed by your provider will help you have the flexibility and strength to maintain correct postures.  The correct posture provides the optimal environment for your joints to work. All of your joints have less wear and tear when properly supported by a spine with good posture. This means you will experience a healthier, less painful body.  Correct posture must be practiced with all of your activities, especially prolonged sitting and standing. Correct posture is as  important when doing repetitive low-stress activities (typing) as it is when doing a single heavy-load activity (lifting). PROLONGED STANDING WHILE SLIGHTLY LEANING FORWARD When completing a task that requires you to lean forward while standing in one place for a long time, place either foot up on a stationary 2-4 inch high object to help maintain the best posture. When both feet are on the ground, the low back tends to lose its slight inward curve. If this curve flattens (or becomes too large), then the back and your other joints will experience too much stress, fatigue more quickly and can cause pain.  RESTING POSITIONS Consider which positions are most painful for you when choosing a resting position. If you have pain with flexion-based activities (sitting, bending, stooping, squatting), choose a position that allows you to rest in a less flexed posture. You would want to avoid curling into a fetal position on your side. If your pain worsens with extension-based activities (prolonged standing, working overhead), avoid resting in an extended position such as sleeping on your stomach. Most people will find more comfort when they rest with their spine in a more neutral position, neither too rounded nor too arched. Lying on a non-sagging bed on your side with a pillow between your knees, or on your back with a pillow under your knees will often provide some relief. Keep in mind, being in any one position for a prolonged period of time, no matter how correct your posture, can still lead to stiffness. WALKING Walk with an upright posture. Your ears, shoulders and hips should all line-up. OFFICE WORK When working at a desk, create an environment that supports good, upright posture. Without extra support, muscles fatigue and lead to excessive strain on joints and other tissues. CHAIR:  A chair should be able to slide under your desk when your back makes contact with the back of the chair. This allows you to  work closely.  The chair's height should allow your eyes to be level with the upper part of your monitor and your hands to be slightly lower than your elbows.  Body position:  Your feet should make contact with the floor. If this is not possible, use a foot rest.  Keep your ears over your shoulders. This will reduce stress on your neck and low back. Document Released: 08/11/2005 Document Revised: 11/03/2011 Document Reviewed: 11/23/2008 [facility] Patient Information 2013 [place], [place].

## 2012-10-15 NOTE — Assessment & Plan Note (Signed)
appt set to see ortho Interested to see recs.

## 2012-11-16 ENCOUNTER — Encounter: Payer: Self-pay | Admitting: Family Medicine

## 2012-11-16 ENCOUNTER — Ambulatory Visit (INDEPENDENT_AMBULATORY_CARE_PROVIDER_SITE_OTHER): Payer: PRIVATE HEALTH INSURANCE | Admitting: Family Medicine

## 2012-11-16 ENCOUNTER — Ambulatory Visit: Payer: Self-pay | Admitting: Family Medicine

## 2012-11-16 VITALS — BP 139/81 | HR 64 | Temp 98.9°F | Ht 73.0 in | Wt 330.0 lb

## 2012-11-16 DIAGNOSIS — E669 Obesity, unspecified: Secondary | ICD-10-CM

## 2012-11-16 DIAGNOSIS — Z7189 Other specified counseling: Secondary | ICD-10-CM

## 2012-11-16 DIAGNOSIS — F17200 Nicotine dependence, unspecified, uncomplicated: Secondary | ICD-10-CM

## 2012-11-16 DIAGNOSIS — I10 Essential (primary) hypertension: Secondary | ICD-10-CM

## 2012-11-16 MED ORDER — CYCLOBENZAPRINE HCL 10 MG PO TABS: 10.0000 mg | ORAL_TABLET | Freq: Two times a day (BID) | ORAL | Status: DC | PRN

## 2012-11-16 MED ORDER — TRAMADOL HCL 50 MG PO TABS: 50.0000 mg | ORAL_TABLET | Freq: Four times a day (QID) | ORAL | Status: DC | PRN

## 2012-11-16 NOTE — Patient Instructions (Addendum)
You are doing well Please continue to meet with orthopedic doctors for your joint pain and possible surgery Please start doing your neck exercises I have refilled your tramadol for 6 months Please cohntinue to try to lose weight Please start taking your blood pressure medicine  Come back to see me in 6 months or sooner if needed

## 2012-11-16 NOTE — Progress Notes (Signed)
Julian Lucas is a 37 y.o. male who presents to [facility] today for med refill   Knee pain: saw ortho who said pt needed to lose 40lb prior to surgery. Continues to be apinful. Improved after steroid injection February  Wt loss: unable to run/jog/walk. Difficulty finding time to exercise. Decreasing fried foods. Not eating fast food at work.   Tobacco use: smoking 2 pack per week. Not interested in quitting at this time. May try E-cig.   HTN: Did not take BP meds. Denies syncope, CP, SOB, palpitations.   The following portions of the patient's history were reviewed and updated as appropriate: allergies, current medications, past medical history, family and social history, and problem list.  Patient is a smoker   Past Medical History  Diagnosis Date  . Hypertension   . HERPES GENITALIS 11/21/2009    Qualifier: Diagnosis of  By: Constance Goltz MD, Molli Hazard    . PULMONARY EMBOLISM, HX OF 11/21/2009    Qualifier: Diagnosis of  By: Lelon Perla MD, Vickki Muff    . UNEQUAL LEG LENGTH 08/09/2010    Qualifier: Diagnosis of  By: Alvester Morin MD, Viviann Spare    . DEGENERATIVE JOINT DISEASE, LEFT KNEE 09/09/2010    Qualifier: Diagnosis of  By: FIELDS MD, KARL      ROS as above otherwise neg.    Medications reviewed. Current Outpatient Prescriptions  Medication Sig Dispense Refill  . acetaminophen (TYLENOL) 500 MG tablet Take 500 mg by mouth every 6 (six) hours as needed (for headache).      . cyclobenzaprine (FLEXERIL) 10 MG tablet Take 1 tablet (10 mg total) by mouth 2 (two) times daily as needed for muscle spasms.  20 tablet  0  . gabapentin (NEURONTIN) 300 MG capsule Take 300 mg by mouth 2 (two) times daily.      Marland Kitchen ibuprofen (ADVIL,MOTRIN) 800 MG tablet Take 1 tablet (800 mg total) by mouth 3 (three) times daily.  21 tablet  0  . lisinopril (PRINIVIL,ZESTRIL) 5 MG tablet Take 5 mg by mouth daily.      . traMADol (ULTRAM) 50 MG tablet Take 50 mg by mouth every 6 (six) hours as needed for pain.       No current  facility-administered medications for this visit.    Exam: BP 139/81  Pulse 64  Temp(Src) 98.9 F (37.2 C) (Oral)  Ht 6\' 1"  (1.854 m)  Wt 330 lb (149.687 kg)  BMI 43.55 kg/m2 Gen: Well NAD HEENT: EOMI,  MMM   No results found for this or any previous visit (from the past 72 hour(s)).

## 2012-11-22 DIAGNOSIS — Z7189 Other specified counseling: Secondary | ICD-10-CM | POA: Insufficient documentation

## 2012-11-22 NOTE — Assessment & Plan Note (Signed)
Wt down 6lb from previous visit.  Difficutlt for pt to lose wt as he works in [employer] and is unable to exercise secondary to MSK issues.  Encouraged to improve diet as he is already doing.  Will consider meeting w/ nutritionist.

## 2012-11-22 NOTE — Assessment & Plan Note (Signed)
Still not interested in quitting Provided information concerning E-cig and [place] quitline

## 2012-11-22 NOTE — Assessment & Plan Note (Signed)
Continue current therapy Reiterated importance of compliance.

## 2012-12-23 ENCOUNTER — Encounter (HOSPITAL_COMMUNITY): Payer: Self-pay | Admitting: Adult Health

## 2012-12-23 ENCOUNTER — Emergency Department (HOSPITAL_COMMUNITY)
Admission: EM | Admit: 2012-12-23 | Discharge: 2012-12-23 | Disposition: A | Discharge status: Home / Self Care | Payer: PRIVATE HEALTH INSURANCE | Attending: Emergency Medicine | Admitting: Emergency Medicine

## 2012-12-23 DIAGNOSIS — M5430 Sciatica, unspecified side: Secondary | ICD-10-CM | POA: Insufficient documentation

## 2012-12-23 DIAGNOSIS — IMO0002 Reserved for concepts with insufficient information to code with codable children: Secondary | ICD-10-CM | POA: Insufficient documentation

## 2012-12-23 DIAGNOSIS — Z8619 Personal history of other infectious and parasitic diseases: Secondary | ICD-10-CM | POA: Insufficient documentation

## 2012-12-23 DIAGNOSIS — M25559 Pain in unspecified hip: Secondary | ICD-10-CM | POA: Insufficient documentation

## 2012-12-23 DIAGNOSIS — I10 Essential (primary) hypertension: Secondary | ICD-10-CM | POA: Insufficient documentation

## 2012-12-23 DIAGNOSIS — F17200 Nicotine dependence, unspecified, uncomplicated: Secondary | ICD-10-CM | POA: Insufficient documentation

## 2012-12-23 DIAGNOSIS — M1710 Unilateral primary osteoarthritis, unspecified knee: Secondary | ICD-10-CM | POA: Insufficient documentation

## 2012-12-23 DIAGNOSIS — M5432 Sciatica, left side: Secondary | ICD-10-CM

## 2012-12-23 DIAGNOSIS — Z86711 Personal history of pulmonary embolism: Secondary | ICD-10-CM | POA: Insufficient documentation

## 2012-12-23 DIAGNOSIS — R209 Unspecified disturbances of skin sensation: Secondary | ICD-10-CM | POA: Insufficient documentation

## 2012-12-23 DIAGNOSIS — M2170 Unequal limb length (acquired), unspecified site: Secondary | ICD-10-CM | POA: Insufficient documentation

## 2012-12-23 DIAGNOSIS — Z79899 Other long term (current) drug therapy: Secondary | ICD-10-CM | POA: Insufficient documentation

## 2012-12-23 MED ORDER — PREDNISONE 20 MG PO TABS: 20.0000 mg | ORAL_TABLET | Freq: Every day | ORAL | Status: DC

## 2012-12-23 MED ORDER — ONDANSETRON 4 MG PO TBDP
4.0000 mg | ORAL_TABLET | Freq: Once | ORAL | Status: AC
  Administered 2012-12-23: 4 mg via ORAL
  Filled 2012-12-23: qty 1

## 2012-12-23 MED ORDER — OXYCODONE-ACETAMINOPHEN 5-325 MG PO TABS: 1.0000 | ORAL_TABLET | ORAL | Status: DC | PRN

## 2012-12-23 MED ORDER — MORPHINE SULFATE 4 MG/ML IJ SOLN
4.0000 mg | Freq: Once | INTRAMUSCULAR | Status: AC
  Administered 2012-12-23: 4 mg via INTRAMUSCULAR
  Filled 2012-12-23: qty 1

## 2012-12-23 NOTE — ED Provider Notes (Signed)
History    This chart was scribed for Julian Lucas (PA) non-physician practitioner working with Julian Sou, MD by Julian Lucas, ED Scribe. This patient was seen in room TR09C/TR09C and the patient's care was started at 8:12PM.   CSN: [id number]  Arrival date & time 12/23/12  1759   First MD Initiated Contact with Patient 12/23/12 2012      Chief Complaint  Patient presents with  . Back Pain    (Consider location/radiation/quality/duration/timing/severity/associated sxs/prior treatment) The history is provided by the patient. No language interpreter was used.    Julian Lucas is a 37 y.o. male , with a hx of hypertension, pulmonary embolism, herpes genitalis and degenerative joint disease, who presents to the Emergency Department complaining of sudden, progressively worsening, back pain, located at the lumbar region, radiating downwards towards the left hip and left lower extremity, onset four days ago (12/19/12).  Associated symptoms include hip pain located at the left hip. The pt reports he began to experience a painful sensation located within his lower back upon waking Sunday morning (12/19/12). The pt characterizes his back pain as a numbness, which intermittently radiates downwards towards his left hip and left lower extremity. The pt has applied Bengay to his lower back, which he informs, does not provide relief of his back pain. Additionally, the pt has taken flexeril and tramadol which do not provide relief of the back pain. Modifying factors include certain movements and positions, in addition to standing upright for prolonged periods of time, which intensify the lower back and left hip pain.   The pt denies bladder/bowel incontinence.   The pt is a current everyday smoker, in addition to drinking alcohol.  PCP is Dr. Konrad Dolores.    Past Medical History  Diagnosis Date  . Hypertension   . HERPES GENITALIS 11/21/2009    Qualifier: Diagnosis of  By: Constance Goltz MD, Molli Hazard    . PULMONARY  EMBOLISM, HX OF 11/21/2009    Qualifier: Diagnosis of  By: Lelon Perla MD, Vickki Muff    . UNEQUAL LEG LENGTH 08/09/2010    Qualifier: Diagnosis of  By: Alvester Morin MD, Viviann Spare    . DEGENERATIVE JOINT DISEASE, LEFT KNEE 09/09/2010    Qualifier: Diagnosis of  By: Darrick Penna MD, KARL      Past Surgical History  Procedure Laterality Date  . Knee surgery    . Neck surgery    . Tonsilectomy, adenoidectomy, bilateral myringotomy and tubes      Family History  Problem Relation Age of Onset  . Lung cancer Father   . Depression Mother   . Hypertension Mother   . Heart disease Mother     History  Substance Use Topics  . Smoking status: Current Every Day Smoker -- 0.30 packs/day    Types: Cigarettes  . Smokeless tobacco: Not on file  . Alcohol Use: Yes      Review of Systems  Musculoskeletal: Positive for back pain and arthralgias.  Neurological: Positive for numbness.  All other systems reviewed and are negative.    Allergies  Review of patient's allergies indicates no known allergies.  Home Medications   Current Outpatient Rx  Name  Route  Sig  Dispense  Refill  . acetaminophen (TYLENOL) 500 MG tablet   Oral   Take 500 mg by mouth every 6 (six) hours as needed (for headache).         . cyclobenzaprine (FLEXERIL) 10 MG tablet   Oral   Take 1 tablet (10 mg total) by mouth 2 (two)  times daily as needed for muscle spasms.   30 tablet   0   . gabapentin (NEURONTIN) 300 MG capsule   Oral   Take 300 mg by mouth 2 (two) times daily.         Marland Kitchen lisinopril (PRINIVIL,ZESTRIL) 5 MG tablet   Oral   Take 5 mg by mouth daily.         . traMADol (ULTRAM) 50 MG tablet   Oral   Take 1 tablet (50 mg total) by mouth every 6 (six) hours as needed for pain.   90 tablet   1   . oxyCODONE-acetaminophen (PERCOCET/ROXICET) 5-325 MG per tablet   Oral   Take 1-2 tablets by mouth every 4 (four) hours as needed for pain.   15 tablet   0   . predniSONE (DELTASONE) 20 MG tablet   Oral   Take  1 tablet (20 mg total) by mouth daily.   14 tablet   0     BP 138/82  Pulse 108  Temp(Src) 98.4 F (36.9 C) (Oral)  Resp 16  SpO2 97%  Physical Exam  Nursing note and vitals reviewed. Constitutional: He is oriented to person, place, and time. He appears well-developed and well-nourished. No distress.  HENT:  Head: Normocephalic and atraumatic.  Eyes: EOM are normal.  Neck: Neck supple. No tracheal deviation present.  Cardiovascular: Normal rate.   Pulmonary/Chest: Effort normal. No respiratory distress.  Musculoskeletal: Normal range of motion.       Left hip: He exhibits normal range of motion and normal strength.       Legs:  Equal strength to bilateral lower extremities. Neurosensory  function adequate to both legs. Skin color is normal. Skin is warm and moist. I see no step off deformity, no bony tenderness. Pt is able to ambulate without limp. Pain is relieved when sitting in certain positions. ROM is decreased due to pain. No crepitus, laceration, effusion, swelling.  Pulses are normal   Neurological: He is alert and oriented to person, place, and time.  Skin: Skin is warm and dry.  Psychiatric: He has a normal mood and affect. His behavior is normal.    ED Course  Procedures (including critical care time)  DIAGNOSTIC STUDIES: Oxygen Saturation is 97% on room air, normal by my interpretation.    COORDINATION OF CARE:    8:49 PM- Treatment plan discussed with patient. Pt agrees with treatment.   ED medications this visit (12/23/12):  Medications  ondansetron (ZOFRAN-ODT) disintegrating tablet 4 mg (not administered)  morphine 4 MG/ML injection 4 mg (not administered)   Medication given at discharge with Ortho referral : oxyCODONE-acetaminophen (PERCOCET/ROXICET) 5-325 MG per tablet Take 1-2 tablets by mouth every 4 (four) hours as needed for pain. 15 tablet Julian Servello Irine Seal, PA-C predniSONE (DELTASONE) 20 MG tablet Take 1 tablet (20 mg total) by mouth daily. 14  tablet Julian Matas, PA-    Labs Reviewed - No data to display No results found.   1. Sciatica neuralgia, left       MDM  Patient with back pain. No neurological deficits. Patient is ambulatory. No warning symptoms of back pain including: loss of bowel or bladder control, night sweats, waking from sleep with back pain, unexplained fevers or weight loss, h/o cancer, IVDU, recent trauma. No concern for cauda equina, epidural abscess, or other serious cause of back pain. Conservative measures such as rest, ice/heat and pain medicine indicated with PCP follow-up if no improvement with conservative management.  I personally performed the services described in this documentation, which was scribed in my presence. The recorded information has been reviewed and is accurate.    Julian Matas, PA-C 12/23/12 2055

## 2012-12-23 NOTE — ED Notes (Signed)
Presents with lower back pain that radiates around to hip and front of thigh. Pain is described as numbness. Pt denies. Pain is worse with standing and sitting for long periods. Nothing makes pain better. +1 pedal pulse.

## 2012-12-23 NOTE — ED Notes (Signed)
Pt discharged.Vital signs stable and GCS 15 

## 2012-12-24 NOTE — ED Provider Notes (Signed)
Medical screening examination/treatment/procedure(s) were performed by non-physician practitioner and as supervising physician I was immediately available for consultation/collaboration.  Doug Sou, MD 12/24/12 [phone]

## 2013-01-06 ENCOUNTER — Encounter: Payer: Self-pay | Admitting: Family Medicine

## 2013-01-06 ENCOUNTER — Ambulatory Visit (INDEPENDENT_AMBULATORY_CARE_PROVIDER_SITE_OTHER): Payer: PRIVATE HEALTH INSURANCE | Admitting: Family Medicine

## 2013-01-06 VITALS — BP 125/82 | HR 76 | Temp 98.4°F | Ht 73.0 in | Wt 328.0 lb

## 2013-01-06 DIAGNOSIS — I10 Essential (primary) hypertension: Secondary | ICD-10-CM

## 2013-01-06 DIAGNOSIS — M5432 Sciatica, left side: Secondary | ICD-10-CM

## 2013-01-06 DIAGNOSIS — F17200 Nicotine dependence, unspecified, uncomplicated: Secondary | ICD-10-CM

## 2013-01-06 DIAGNOSIS — M5430 Sciatica, unspecified side: Secondary | ICD-10-CM | POA: Insufficient documentation

## 2013-01-06 MED ORDER — CYCLOBENZAPRINE HCL 10 MG PO TABS: 10.0000 mg | ORAL_TABLET | Freq: Two times a day (BID) | ORAL | Status: DC | PRN

## 2013-01-06 MED ORDER — KETOROLAC TROMETHAMINE 60 MG/2ML IM SOLN
60.0000 mg | Freq: Once | INTRAMUSCULAR | Status: AC
  Administered 2013-01-06: 60 mg via INTRAMUSCULAR

## 2013-01-06 NOTE — Assessment & Plan Note (Signed)
Symptoms typical for sciatica toradol 60mg  IM in office Home exercises given Pt to continue home pain medications and start exercises  If no improvement then will try formal PT.

## 2013-01-06 NOTE — Progress Notes (Signed)
Julian Lucas is a 37 y.o. male who presents to [facility] today for ED f/u  L lower back pain travels to but and down laterala dn ant thigh and down to foot. And numbness in toes. Denies any loss of bowel or bladder fxn. Improves w/ resting on R side. Worse w/ ambulation. No traumatic. Woke up w/ this pain one morning. Started a few wks ago. Prednisone from ED w/o benefit. Gabapentin and tramadol w/ mild benefit. Home exercises w/ significant benefit.   HTN: Taking Lisinopril.  MSK Pain: Still waiting more funds for surgery. Does not like using percocet b/x makes sleepy  The following portions of the patient's history were reviewed and updated as appropriate: allergies, current medications, past medical history, family and social history, and problem list.  Patient is a 1ppd smoker  Past Medical History  Diagnosis Date  . Hypertension   . HERPES GENITALIS 11/21/2009    Qualifier: Diagnosis of  By: Constance Goltz MD, Molli Hazard    . PULMONARY EMBOLISM, HX OF 11/21/2009    Qualifier: Diagnosis of  By: Lelon Perla MD, Vickki Muff    . UNEQUAL LEG LENGTH 08/09/2010    Qualifier: Diagnosis of  By: Alvester Morin MD, Viviann Spare    . DEGENERATIVE JOINT DISEASE, LEFT KNEE 09/09/2010    Qualifier: Diagnosis of  By: FIELDS MD, KARL      ROS as above otherwise neg.    Medications reviewed. Current Outpatient Prescriptions  Medication Sig Dispense Refill  . acetaminophen (TYLENOL) 500 MG tablet Take 500 mg by mouth every 6 (six) hours as needed (for headache).      . cyclobenzaprine (FLEXERIL) 10 MG tablet Take 1 tablet (10 mg total) by mouth 2 (two) times daily as needed for muscle spasms.  30 tablet  0  . diclofenac (VOLTAREN) 75 MG EC tablet Take 75 mg by mouth 2 (two) times daily as needed.      . gabapentin (NEURONTIN) 300 MG capsule Take 300 mg by mouth 2 (two) times daily.      Marland Kitchen lisinopril (PRINIVIL,ZESTRIL) 5 MG tablet Take 5 mg by mouth daily.      Marland Kitchen oxyCODONE-acetaminophen (PERCOCET/ROXICET) 5-325 MG per tablet Take 1-2  tablets by mouth every 4 (four) hours as needed for pain.  15 tablet  0  . predniSONE (DELTASONE) 20 MG tablet Take 1 tablet (20 mg total) by mouth daily.  14 tablet  0  . traMADol (ULTRAM) 50 MG tablet Take 1 tablet (50 mg total) by mouth every 6 (six) hours as needed for pain.  90 tablet  1   No current facility-administered medications for this visit.    Exam:  BP 125/82  Pulse 76  Temp(Src) 98.4 F (36.9 C)  Ht 6\' 1"  (1.854 m)  Wt 328 lb (148.78 kg)  BMI 43.28 kg/m2 Gen: Well NAD HEENT: EOMI,  MMM MSK: L leg numbness on ant/lat thigh. Pain in deep gluteus muscles on L. L Abductor weakness. Medial thigh and lower leg sensation intact and symmetrical.   No results found for this or any previous visit (from the past 72 hour(s)).

## 2013-01-06 NOTE — Assessment & Plan Note (Signed)
Cont to smoke 1ppd. Difficult to get pt to want to quit

## 2013-01-06 NOTE — Addendum Note (Signed)
Addended by: Jone Baseman D on: 01/06/2013 03:47 PM   Modules accepted: Orders

## 2013-01-06 NOTE — Assessment & Plan Note (Signed)
Well controlled today Cont lisinopril

## 2013-01-06 NOTE — Patient Instructions (Addendum)
You have sciatica This should improve with home exercises and medicine Please continue taking the diclofenac, gabapentin, flexeril for pain relief.  If your symptoms do not improve in another 2 weeks, we may need to send you for formal physical therapy.  Sciatica with Rehab The sciatic nerve runs from the back down the leg and is responsible for sensation and control of the muscles in the back (posterior) side of the thigh, lower leg, and foot. Sciatica is a condition that is characterized by inflammation of this nerve.  SYMPTOMS   Signs of nerve damage, including numbness and/or weakness along the posterior side of the lower extremity.  Pain in the back of the thigh that may also travel down the leg.  Pain that worsens when sitting for long periods of time.  Occasionally, pain in the back or buttock. CAUSES  Inflammation of the sciatic nerve is the cause of sciatica. The inflammation is due to something irritating the nerve. Common sources of irritation include:  Sitting for long periods of time.  Direct trauma to the nerve.  Arthritis of the spine.  Herniated or ruptured disk.  Slipping of the vertebrae (spondylolithesis)  Pressure from soft tissues, such as muscles or ligament-like tissue (fascia). RISK INCREASES WITH:  Sports that place pressure or stress on the spine (football or weightlifting).  Poor strength and flexibility.  Failure to warm-up properly before activity.  Family history of low back pain or disk disorders.  Previous back injury or surgery.  Poor body mechanics, especially when lifting, or poor posture. PREVENTION   Warm up and stretch properly before activity.  Maintain physical fitness:  Strength, flexibility, and endurance.  Cardiovascular fitness.  Learn and use proper technique, especially with posture and lifting. When possible, have coach correct improper technique.  Avoid activities that place stress on the spine. PROGNOSIS If  treated properly, then sciatica usually resolves within 6 weeks. However, occasionally surgery is necessary.  RELATED COMPLICATIONS   Permanent nerve damage, including pain, numbness, tingle, or weakness.  Chronic back pain.  Risks of surgery: infection, bleeding, nerve damage, or damage to surrounding tissues. TREATMENT Treatment initially involves resting from any activities that aggravate your symptoms. The use of ice and medication may help reduce pain and inflammation. The use of strengthening and stretching exercises may help reduce pain with activity. These exercises may be performed at home or with referral to a therapist. A therapist may recommend further treatments, such as transcutaneous electronic nerve stimulation (TENS) or ultrasound. Your caregiver may recommend corticosteroid injections to help reduce inflammation of the sciatic nerve. If symptoms persist despite non-surgical (conservative) treatment, then surgery may be recommended. MEDICATION  If pain medication is necessary, then nonsteroidal anti-inflammatory medications, such as aspirin and ibuprofen, or other minor pain relievers, such as acetaminophen, are often recommended.  Do not take pain medication for 7 days before surgery.  Prescription pain relievers may be given if deemed necessary by your caregiver. Use only as directed and only as much as you need.  Ointments applied to the skin may be helpful.  Corticosteroid injections may be given by your caregiver. These injections should be reserved for the most serious cases, because they may only be given a certain number of times. HEAT AND COLD  Cold treatment (icing) relieves pain and reduces inflammation. Cold treatment should be applied for 10 to 15 minutes every 2 to 3 hours for inflammation and pain and immediately after any activity that aggravates your symptoms. Use ice packs or massage the  area with a piece of ice (ice massage).  Heat treatment may be used  prior to performing the stretching and strengthening activities prescribed by your caregiver, physical therapist, or athletic trainer. Use a heat pack or soak the injury in warm water. SEEK MEDICAL CARE IF:  Treatment seems to offer no benefit, or the condition worsens.  Any medications produce adverse side effects. EXERCISES  RANGE OF MOTION (ROM) AND STRETCHING EXERCISES - Sciatica Most people with sciatic will find that their symptoms worsen with either excessive bending forward (flexion) or arching at the low back (extension). The exercises which will help resolve your symptoms will focus on the opposite motion. Your physician, physical therapist or athletic trainer will help you determine which exercises will be most helpful to resolve your low back pain. Do not complete any exercises without first consulting with your clinician. Discontinue any exercises which worsen your symptoms until you speak to your clinician. If you have pain, numbness or tingling which travels down into your buttocks, leg or foot, the goal of the therapy is for these symptoms to move closer to your back and eventually resolve. Occasionally, these leg symptoms will get better, but your low back pain may worsen; this is typically an indication of progress in your rehabilitation. Be certain to be very alert to any changes in your symptoms and the activities in which you participated in the 24 hours prior to the change. Sharing this information with your clinician will allow him/her to most efficiently treat your condition. These exercises may help you when beginning to rehabilitate your injury. Your symptoms may resolve with or without further involvement from your physician, physical therapist or athletic trainer. While completing these exercises, remember:   Restoring tissue flexibility helps normal motion to return to the joints. This allows healthier, less painful movement and activity.  An effective stretch should be held  for at least 30 seconds.  A stretch should never be painful. You should only feel a gentle lengthening or release in the stretched tissue. FLEXION RANGE OF MOTION AND STRETCHING EXERCISES: STRETCH  Flexion, Single Knee to Chest   Lie on a firm bed or floor with both legs extended in front of you.  Keeping one leg in contact with the floor, bring your opposite knee to your chest. Hold your leg in place by either grabbing behind your thigh or at your knee.  Pull until you feel a gentle stretch in your low back. Hold __________ seconds.  Slowly release your grasp and repeat the exercise with the opposite side. Repeat __________ times. Complete this exercise __________ times per day.  STRETCH  Flexion, Double Knee to Chest  Lie on a firm bed or floor with both legs extended in front of you.  Keeping one leg in contact with the floor, bring your opposite knee to your chest.  Tense your stomach muscles to support your back and then lift your other knee to your chest. Hold your legs in place by either grabbing behind your thighs or at your knees.  Pull both knees toward your chest until you feel a gentle stretch in your low back. Hold __________ seconds.  Tense your stomach muscles and slowly return one leg at a time to the floor. Repeat __________ times. Complete this exercise __________ times per day.  STRETCH  Low Trunk Rotation   Lie on a firm bed or floor. Keeping your legs in front of you, bend your knees so they are both pointed toward the ceiling and  your feet are flat on the floor.  Extend your arms out to the side. This will stabilize your upper body by keeping your shoulders in contact with the floor.  Gently and slowly drop both knees together to one side until you feel a gentle stretch in your low back. Hold for __________ seconds.  Tense your stomach muscles to support your low back as you bring your knees back to the starting position. Repeat the exercise to the other  side. Repeat __________ times. Complete this exercise __________ times per day  EXTENSION RANGE OF MOTION AND FLEXIBILITY EXERCISES: STRETCH  Extension, Prone on Elbows  Lie on your stomach on the floor, a bed will be too soft. Place your palms about shoulder width apart and at the height of your head.  Place your elbows under your shoulders. If this is too painful, stack pillows under your chest.  Allow your body to relax so that your hips drop lower and make contact more completely with the floor.  Hold this position for __________ seconds.  Slowly return to lying flat on the floor. Repeat __________ times. Complete this exercise __________ times per day.  RANGE OF MOTION  Extension, Prone Press Ups  Lie on your stomach on the floor, a bed will be too soft. Place your palms about shoulder width apart and at the height of your head.  Keeping your back as relaxed as possible, slowly straighten your elbows while keeping your hips on the floor. You may adjust the placement of your hands to maximize your comfort. As you gain motion, your hands will come more underneath your shoulders.  Hold this position __________ seconds.  Slowly return to lying flat on the floor. Repeat __________ times. Complete this exercise __________ times per day.  STRENGTHENING EXERCISES - Sciatica  These exercises may help you when beginning to rehabilitate your injury. These exercises should be done near your "sweet spot." This is the neutral, low-back arch, somewhere between fully rounded and fully arched, that is your least painful position. When performed in this safe range of motion, these exercises can be used for people who have either a flexion or extension based injury. These exercises may resolve your symptoms with or without further involvement from your physician, physical therapist or athletic trainer. While completing these exercises, remember:   Muscles can gain both the endurance and the strength  needed for everyday activities through controlled exercises.  Complete these exercises as instructed by your physician, physical therapist or athletic trainer. Progress with the resistance and repetition exercises only as your caregiver advises.  You may experience muscle soreness or fatigue, but the pain or discomfort you are trying to eliminate should never worsen during these exercises. If this pain does worsen, stop and make certain you are following the directions exactly. If the pain is still present after adjustments, discontinue the exercise until you can discuss the trouble with your clinician. STRENGTHENING Deep Abdominals, Pelvic Tilt   Lie on a firm bed or floor. Keeping your legs in front of you, bend your knees so they are both pointed toward the ceiling and your feet are flat on the floor.  Tense your lower abdominal muscles to press your low back into the floor. This motion will rotate your pelvis so that your tail bone is scooping upwards rather than pointing at your feet or into the floor.  With a gentle tension and even breathing, hold this position for __________ seconds. Repeat __________ times. Complete this exercise __________ times  per day.  STRENGTHENING  Abdominals, Crunches   Lie on a firm bed or floor. Keeping your legs in front of you, bend your knees so they are both pointed toward the ceiling and your feet are flat on the floor. Cross your arms over your chest.  Slightly tip your chin down without bending your neck.  Tense your abdominals and slowly lift your trunk high enough to just clear your shoulder blades. Lifting higher can put excessive stress on the low back and does not further strengthen your abdominal muscles.  Control your return to the starting position. Repeat __________ times. Complete this exercise __________ times per day.  STRENGTHENING  Quadruped, Opposite UE/LE Lift  Assume a hands and knees position on a firm surface. Keep your hands under  your shoulders and your knees under your hips. You may place padding under your knees for comfort.  Find your neutral spine and gently tense your abdominal muscles so that you can maintain this position. Your shoulders and hips should form a rectangle that is parallel with the floor and is not twisted.  Keeping your trunk steady, lift your right hand no higher than your shoulder and then your left leg no higher than your hip. Make sure you are not holding your breath. Hold this position __________ seconds.  Continuing to keep your abdominal muscles tense and your back steady, slowly return to your starting position. Repeat with the opposite arm and leg. Repeat __________ times. Complete this exercise __________ times per day.  STRENGTHENING  Abdominals and Quadriceps, Straight Leg Raise   Lie on a firm bed or floor with both legs extended in front of you.  Keeping one leg in contact with the floor, bend the other knee so that your foot can rest flat on the floor.  Find your neutral spine, and tense your abdominal muscles to maintain your spinal position throughout the exercise.  Slowly lift your straight leg off the floor about 6 inches for a count of 15, making sure to not hold your breath.  Still keeping your neutral spine, slowly lower your leg all the way to the floor. Repeat this exercise with each leg __________ times. Complete this exercise __________ times per day. POSTURE AND BODY MECHANICS CONSIDERATIONS - Sciatica Keeping correct posture when sitting, standing or completing your activities will reduce the stress put on different body tissues, allowing injured tissues a chance to heal and limiting painful experiences. The following are general guidelines for improved posture. Your physician or physical therapist will provide you with any instructions specific to your needs. While reading these guidelines, remember:  The exercises prescribed by your provider will help you have the  flexibility and strength to maintain correct postures.  The correct posture provides the optimal environment for your joints to work. All of your joints have less wear and tear when properly supported by a spine with good posture. This means you will experience a healthier, less painful body.  Correct posture must be practiced with all of your activities, especially prolonged sitting and standing. Correct posture is as important when doing repetitive low-stress activities (typing) as it is when doing a single heavy-load activity (lifting). RESTING POSITIONS Consider which positions are most painful for you when choosing a resting position. If you have pain with flexion-based activities (sitting, bending, stooping, squatting), choose a position that allows you to rest in a less flexed posture. You would want to avoid curling into a fetal position on your side. If your pain worsens  with extension-based activities (prolonged standing, working overhead), avoid resting in an extended position such as sleeping on your stomach. Most people will find more comfort when they rest with their spine in a more neutral position, neither too rounded nor too arched. Lying on a non-sagging bed on your side with a pillow between your knees, or on your back with a pillow under your knees will often provide some relief. Keep in mind, being in any one position for a prolonged period of time, no matter how correct your posture, can still lead to stiffness. PROPER SITTING POSTURE In order to minimize stress and discomfort on your spine, you must sit with correct posture Sitting with good posture should be effortless for a healthy body. Returning to good posture is a gradual process. Many people can work toward this most comfortably by using various supports until they have the flexibility and strength to maintain this posture on their own. When sitting with proper posture, your ears will fall over your shoulders and your shoulders  will fall over your hips. You should use the back of the chair to support your upper back. Your low back will be in a neutral position, just slightly arched. You may place a small pillow or folded towel at the base of your low back for support.  When working at a desk, create an environment that supports good, upright posture. Without extra support, muscles fatigue and lead to excessive strain on joints and other tissues. Keep these recommendations in mind: CHAIR:   A chair should be able to slide under your desk when your back makes contact with the back of the chair. This allows you to work closely.  The chair's height should allow your eyes to be level with the upper part of your monitor and your hands to be slightly lower than your elbows. BODY POSITION  Your feet should make contact with the floor. If this is not possible, use a foot rest.  Keep your ears over your shoulders. This will reduce stress on your neck and low back. INCORRECT SITTING POSTURES   If you are feeling tired and unable to assume a healthy sitting posture, do not slouch or slump. This puts excessive strain on your back tissues, causing more damage and pain. Healthier options include:  Using more support, like a lumbar pillow.  Switching tasks to something that requires you to be upright or walking.  Talking a brief walk.  Lying down to rest in a neutral-spine position. PROLONGED STANDING WHILE SLIGHTLY LEANING FORWARD  When completing a task that requires you to lean forward while standing in one place for a long time, place either foot up on a stationary 2-4 inch high object to help maintain the best posture. When both feet are on the ground, the low back tends to lose its slight inward curve. If this curve flattens (or becomes too large), then the back and your other joints will experience too much stress, fatigue more quickly and can cause pain.  CORRECT STANDING POSTURES Proper standing posture should be assumed  with all daily activities, even if they only take a few moments, like when brushing your teeth. As in sitting, your ears should fall over your shoulders and your shoulders should fall over your hips. You should keep a slight tension in your abdominal muscles to brace your spine. Your tailbone should point down to the ground, not behind your body, resulting in an over-extended swayback posture.  INCORRECT STANDING POSTURES  Common incorrect standing  postures include a forward head, locked knees and/or an excessive swayback. WALKING Walk with an upright posture. Your ears, shoulders and hips should all line-up. PROLONGED ACTIVITY IN A FLEXED POSITION When completing a task that requires you to bend forward at your waist or lean over a low surface, try to find a way to stabilize 3 of 4 of your limbs. You can place a hand or elbow on your thigh or rest a knee on the surface you are reaching across. This will provide you more stability so that your muscles do not fatigue as quickly. By keeping your knees relaxed, or slightly bent, you will also reduce stress across your low back. CORRECT LIFTING TECHNIQUES DO :   Assume a wide stance. This will provide you more stability and the opportunity to get as close as possible to the object which you are lifting.  Tense your abdominals to brace your spine; then bend at the knees and hips. Keeping your back locked in a neutral-spine position, lift using your leg muscles. Lift with your legs, keeping your back straight.  Test the weight of unknown objects before attempting to lift them.  Try to keep your elbows locked down at your sides in order get the best strength from your shoulders when carrying an object.  Always ask for help when lifting heavy or awkward objects. INCORRECT LIFTING TECHNIQUES DO NOT:   Lock your knees when lifting, even if it is a small object.  Bend and twist. Pivot at your feet or move your feet when needing to change  directions.  Assume that you cannot safely pick up a paperclip without proper posture. Document Released: 08/11/2005 Document Revised: 11/03/2011 Document Reviewed: 11/23/2008 [facility] Patient Information 2013 [place], [place].

## 2013-01-20 ENCOUNTER — Other Ambulatory Visit: Payer: Self-pay | Admitting: *Deleted

## 2013-01-22 ENCOUNTER — Other Ambulatory Visit: Payer: Self-pay | Admitting: Sports Medicine

## 2013-01-22 MED ORDER — GABAPENTIN 300 MG PO CAPS: 300.0000 mg | ORAL_CAPSULE | Freq: Two times a day (BID) | ORAL | Status: DC

## 2013-03-04 ENCOUNTER — Ambulatory Visit (INDEPENDENT_AMBULATORY_CARE_PROVIDER_SITE_OTHER): Payer: PRIVATE HEALTH INSURANCE | Admitting: Family Medicine

## 2013-03-04 ENCOUNTER — Encounter: Payer: Self-pay | Admitting: Family Medicine

## 2013-03-04 VITALS — BP 128/88 | HR 86 | Temp 98.3°F | Ht 73.0 in | Wt 324.0 lb

## 2013-03-04 DIAGNOSIS — I10 Essential (primary) hypertension: Secondary | ICD-10-CM

## 2013-03-04 DIAGNOSIS — M2550 Pain in unspecified joint: Secondary | ICD-10-CM

## 2013-03-04 DIAGNOSIS — E669 Obesity, unspecified: Secondary | ICD-10-CM

## 2013-03-04 DIAGNOSIS — L723 Sebaceous cyst: Secondary | ICD-10-CM | POA: Insufficient documentation

## 2013-03-04 DIAGNOSIS — G8929 Other chronic pain: Secondary | ICD-10-CM

## 2013-03-04 MED ORDER — DICLOFENAC SODIUM 75 MG PO TBEC: 75.0000 mg | DELAYED_RELEASE_TABLET | Freq: Two times a day (BID) | ORAL | Status: DC | PRN

## 2013-03-04 MED ORDER — TRAMADOL HCL 50 MG PO TABS: 50.0000 mg | ORAL_TABLET | Freq: Four times a day (QID) | ORAL | Status: DC | PRN

## 2013-03-04 NOTE — Assessment & Plan Note (Signed)
Benign sebaceous cyst Indications for treatment /excision discussed Pt to observe Handout given

## 2013-03-04 NOTE — Assessment & Plan Note (Signed)
Refill tramadol adn voltaren

## 2013-03-04 NOTE — Patient Instructions (Addendum)
The bump on your chest is a epidermoid or sebaceous The only cure for this is to have it cut out. Insurance will only pay for this if it becomes painful or infected Continue taking all of your other medications as prescribed  Epidermal Cyst An epidermal cyst is sometimes called a sebaceous cyst, epidermal inclusion cyst, or infundibular cyst. These cysts usually contain a substance that looks "pasty" or "cheesy" and may have a bad smell. This substance is a protein called keratin. Epidermal cysts are usually found on the face, neck, or trunk. They may also occur in the vaginal area or other parts of the genitalia of both men and women. Epidermal cysts are usually small, painless, slow-growing bumps or lumps that move freely under the skin. It is important not to try to pop them. This may cause an infection and lead to tenderness and swelling. CAUSES  Epidermal cysts may be caused by a deep penetrating injury to the skin or a plugged hair follicle, often associated with acne. SYMPTOMS  Epidermal cysts can become inflamed and cause:  Redness.  Tenderness.  Increased temperature of the skin over the bumps or lumps.  Grayish-white, bad smelling material that drains from the bump or lump. DIAGNOSIS  Epidermal cysts are easily diagnosed by your caregiver during an exam. Rarely, a tissue sample (biopsy) may be taken to rule out other conditions that may resemble epidermal cysts. TREATMENT   Epidermal cysts often get better and disappear on their own. They are rarely ever cancerous.  If a cyst becomes infected, it may become inflamed and tender. This may require opening and draining the cyst. Treatment with antibiotics may be necessary. When the infection is gone, the cyst may be removed with minor surgery.  Small, inflamed cysts can often be treated with antibiotics or by injecting steroid medicines.  Sometimes, epidermal cysts become large and bothersome. If this happens, surgical removal in  your caregiver's office may be necessary. HOME CARE INSTRUCTIONS  Only take over-the-counter or prescription medicines as directed by your caregiver.  Take your antibiotics as directed. Finish them even if you start to feel better. SEEK MEDICAL CARE IF:   Your cyst becomes tender, red, or swollen.  Your condition is not improving or is getting worse.  You have any other questions or concerns. MAKE SURE YOU:  Understand these instructions.  Will watch your condition.  Will get help right away if you are not doing well or get worse. Document Released: 07/12/2004 Document Revised: 11/03/2011 Document Reviewed: 02/17/2011 [facility] Patient Information 2014 [place], [place].  cyst

## 2013-03-04 NOTE — Assessment & Plan Note (Signed)
Wt down 12lbs since February More active lifestyle during the summers w/ fishing and other outdoor activieties

## 2013-03-04 NOTE — Assessment & Plan Note (Signed)
Well-controlled.  Continue current regimen. 

## 2013-03-04 NOTE — Progress Notes (Signed)
Julian Lucas is a 37 y.o. male who presents to [facility] today for knot on chest  Knott on chest started several months ago. Mid chest in the sternal area. Non-painful. Denies fevers, rash, discharge.   HTN: taking lisinopril as prescribed.  Smoking 6-7 a day.   The following portions of the patient's history were reviewed and updated as appropriate: allergies, current medications, past medical history, family and social history, and problem list.  Patient is a smoker  Past Medical History  Diagnosis Date  . Hypertension   . HERPES GENITALIS 11/21/2009    Qualifier: Diagnosis of  By: Constance Goltz MD, Molli Hazard    . PULMONARY EMBOLISM, HX OF 11/21/2009    Qualifier: Diagnosis of  By: Lelon Perla MD, Vickki Muff    . UNEQUAL LEG LENGTH 08/09/2010    Qualifier: Diagnosis of  By: Alvester Morin MD, Viviann Spare    . DEGENERATIVE JOINT DISEASE, LEFT KNEE 09/09/2010    Qualifier: Diagnosis of  By: FIELDS MD, KARL      ROS as above otherwise neg.    Medications reviewed. Current Outpatient Prescriptions  Medication Sig Dispense Refill  . acetaminophen (TYLENOL) 500 MG tablet Take 500 mg by mouth every 6 (six) hours as needed (for headache).      . cyclobenzaprine (FLEXERIL) 10 MG tablet Take 1 tablet (10 mg total) by mouth 2 (two) times daily as needed for muscle spasms.  30 tablet  0  . diclofenac (VOLTAREN) 75 MG EC tablet Take 75 mg by mouth 2 (two) times daily as needed.      . gabapentin (NEURONTIN) 300 MG capsule Take 1 capsule (300 mg total) by mouth 2 (two) times daily.  180 capsule  3  . gabapentin (NEURONTIN) 300 MG capsule TAKE ONE CAPSULE BY MOUTH THREE TIMES DAILY  90 capsule  3  . lisinopril (PRINIVIL,ZESTRIL) 5 MG tablet Take 5 mg by mouth daily.      . traMADol (ULTRAM) 50 MG tablet Take 1 tablet (50 mg total) by mouth every 6 (six) hours as needed for pain.  90 tablet  1   No current facility-administered medications for this visit.    Exam: BP 128/88  Pulse 86  Temp(Src) 98.3 F (36.8 C) (Oral)  Ht 6'  1" (1.854 m)  Wt 324 lb (146.965 kg)  BMI 42.76 kg/m2 Gen: Well NAD HEENT: EOMI,  MMM Lungs: Nl WOB Heart: RRR  Abd: ND Skin: well circumscribed movable 2cm mass between the breasts just below the surface of the skin.   No results found for this or any previous visit (from the past 72 hour(s)).

## 2013-06-01 ENCOUNTER — Encounter: Payer: Self-pay | Admitting: Sports Medicine

## 2013-06-01 ENCOUNTER — Ambulatory Visit (INDEPENDENT_AMBULATORY_CARE_PROVIDER_SITE_OTHER): Payer: PRIVATE HEALTH INSURANCE | Admitting: Sports Medicine

## 2013-06-01 VITALS — BP 121/83 | HR 70 | Ht 73.0 in | Wt 324.0 lb

## 2013-06-01 DIAGNOSIS — M1710 Unilateral primary osteoarthritis, unspecified knee: Secondary | ICD-10-CM

## 2013-06-01 DIAGNOSIS — M25569 Pain in unspecified knee: Secondary | ICD-10-CM

## 2013-06-01 DIAGNOSIS — M1712 Unilateral primary osteoarthritis, left knee: Secondary | ICD-10-CM

## 2013-06-01 DIAGNOSIS — G8929 Other chronic pain: Secondary | ICD-10-CM

## 2013-06-01 MED ORDER — METHYLPREDNISOLONE ACETATE 40 MG/ML IJ SUSP
40.0000 mg | Freq: Once | INTRAMUSCULAR | Status: AC
  Administered 2013-06-01: 40 mg via INTRA_ARTICULAR

## 2013-06-01 NOTE — Progress Notes (Signed)
  Subjective:    Patient ID: Julian Lucas, male    DOB: 1976/04/22, 37 y.o.   MRN: [id number]  HPI chief complaint: Left knee pain  Very pleasant obese 37 year old male comes in today complaining of chronic left knee pain. 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 As a child he had a rather severe injury to his knee which required surgical pinning and a full leg cast for several weeks. He saw Dr.Fields here in our clinic back in January of 2012. X-rays of that same year showed advanced medial compartmental DJD. He was referred to [facility] who injected his knee with cortisone but this only helped for about a month. Dr. Madelon Lips told the patient that he was too young for a total knee replacement so he has been simply living with his pain. He takes both diclofenac as well as Ultram as needed for pain. His pain is becoming more and more debilitating. Diffuse pain around the left knee with intermittent swelling. Knee wants to give way as well. No recent trauma. Despite his arthritis he has been able to work at [employer]. No associated numbness or tingling.  Past medical history and current medications are reviewed No known drug allergies    Review of Systems     Objective:   Physical Exam Obese. 6 foot 2 and 323lbs. no acute distress. Awake alert and oriented x3.  Left knee: 5 extension lag. Flexion to 120. No effusion. Moderate varus deformity. He is tender to palpation along the medial joint line. Pain but no popping with McMurray's maneuver. No tenderness along the lateral joint line. Knee is grossly stable to ligamentous exam. 1+ patellofemoral crepitus with active extension. Neurovascularly intact distally. Walking with an obvious limp.  X-rays from 2012 are as above. They are non-standing x-rays       Assessment & Plan:  Chronic left knee pain secondary to approaching end-stage DJD  I suspect that the patient has more arthritis than the x-rays suggested back  in 2012. These were non-standing x-rays but I do not feel like we need to repeat any imaging studies. I explained to the patient that definitive treatment is a total knee arthroplasty and that he is very young for that. I also explained that with his obesity that this would place him at an increased risk of hardware failure as well as postoperative complications. We've elected to try a repeat cortisone injection. I would like to try to get the patient a custom medial unloading brace as well. We need to try to maximize all conservative treatment before considering total knee arthroplasty.  Consent obtained and verified. Time-out conducted. Noted no overlying erythema, induration, or other signs of local infection. Skin prepped in a sterile fashion. Topical analgesic spray: Ethyl chloride. Joint: left knee Needle: 25g 1.5 inch Completed without difficulty. Meds: 2cc (80mg ) depomedrol, 6cc 1% xylocaine  Advised to call if fevers/chills, erythema, induration, drainage, or persistent bleeding.

## 2013-06-17 ENCOUNTER — Emergency Department (HOSPITAL_COMMUNITY)
Admission: EM | Admit: 2013-06-17 | Discharge: 2013-06-17 | Disposition: A | Discharge status: Home / Self Care | Payer: PRIVATE HEALTH INSURANCE | Source: Home / Self Care | Attending: Family Medicine | Admitting: Family Medicine

## 2013-06-17 ENCOUNTER — Encounter (HOSPITAL_COMMUNITY): Payer: Self-pay | Admitting: Emergency Medicine

## 2013-06-17 ENCOUNTER — Emergency Department (INDEPENDENT_AMBULATORY_CARE_PROVIDER_SITE_OTHER): Payer: PRIVATE HEALTH INSURANCE

## 2013-06-17 DIAGNOSIS — M25569 Pain in unspecified knee: Secondary | ICD-10-CM

## 2013-06-17 DIAGNOSIS — M25562 Pain in left knee: Secondary | ICD-10-CM

## 2013-06-17 MED ORDER — HYDROCODONE-ACETAMINOPHEN 5-325 MG PO TABS: 1.0000 | ORAL_TABLET | Freq: Four times a day (QID) | ORAL | Status: DC | PRN

## 2013-06-17 NOTE — ED Provider Notes (Signed)
Julian Lucas is a 37 y.o. male who presents to Urgent Care today for left knee pain. Patient has a past medical history significant for severe left knee DJD. He was in his normal state of health today until he attempted to stop his knee and that was rolling away after he forgot to put in [place]. He tripped and fell attempting to chase down the van. He denies being run over by then but notes knee pain following the accident. He denies any radiating pain weakness or numbness. The pain is severe and worse with activity better with rest.   Past Medical History  Diagnosis Date  . Hypertension   . HERPES GENITALIS 11/21/2009    Qualifier: Diagnosis of  By: Constance Goltz MD, Molli Hazard    . PULMONARY EMBOLISM, HX OF 11/21/2009    Qualifier: Diagnosis of  By: Lelon Perla MD, Vickki Muff    . UNEQUAL LEG LENGTH 08/09/2010    Qualifier: Diagnosis of  By: Alvester Morin MD, Viviann Spare    . DEGENERATIVE JOINT DISEASE, LEFT KNEE 09/09/2010    Qualifier: Diagnosis of  By: Darrick Penna MD, KARL     History  Substance Use Topics  . Smoking status: Current Every Day Smoker -- 0.30 packs/day    Types: Cigarettes  . Smokeless tobacco: Not on file     Comment: 6 cigs a day  . Alcohol Use: Yes   ROS as above Medications reviewed. No current facility-administered medications for this encounter.   Current Outpatient Prescriptions  Medication Sig Dispense Refill  . cyclobenzaprine (FLEXERIL) 10 MG tablet Take 1 tablet (10 mg total) by mouth 2 (two) times daily as needed for muscle spasms.  30 tablet  0  . diclofenac (VOLTAREN) 75 MG EC tablet Take 1 tablet (75 mg total) by mouth 2 (two) times daily as needed.  60 tablet  3  . gabapentin (NEURONTIN) 300 MG capsule Take 1 capsule (300 mg total) by mouth 2 (two) times daily.  180 capsule  3  . LISINOPRIL PO Take by mouth.      . traMADol (ULTRAM) 50 MG tablet Take 1 tablet (50 mg total) by mouth every 6 (six) hours as needed for pain.  90 tablet  1  . HYDROcodone-acetaminophen (NORCO/VICODIN) 5-325 MG  per tablet Take 1 tablet by mouth every 6 (six) hours as needed for pain.  20 tablet  0  . lisinopril (PRINIVIL,ZESTRIL) 5 MG tablet Take 5 mg by mouth daily.        Exam:  BP 124/84  Pulse 96  Temp(Src) 98.1 F (36.7 C) (Oral)  Resp 20  SpO2 100% Gen: Well NAD, obese HEENT: EOMI,  MMM Lungs: CTABL Nl WOB Heart: RRR no MRG Abd: NABS, NT, ND Exts: Non edematous BL  LE, warm and well perfused.  Left knee: Degenerative in appearance with moderate effusion Diffusely tender Range of motion 10-90 Stable ligamentous exam Capillary refill sensation intact distally  No results found for this or any previous visit (from the past 24 hour(s)). Dg Knee Complete 4 Views Left  06/17/2013   CLINICAL DATA:  Patient fell and hit left knee  EXAM: LEFT KNEE - COMPLETE 4+ VIEW  COMPARISON:  12/18/2010  FINDINGS: Moderate to severe joint space narrowing medially with severe osteophyte formation. Mild to moderate narrowing of the lateral compartment. Small exostosis arising upper lateral aspect of the proximal fibular shaft. Moderate patellofemoral degenerative change. Deformity proximal shaft of the fibula with mild apex anterior angulation likely related to prior trauma.  IMPRESSION: No acute findings.  No change from prior study.   Electronically Signed   By: Esperanza Heir M.D.   On: 06/17/2013 19:32    Assessment and Plan: 37 y.o. male with pain in the setting of severe end-stage DJD. No new injury. Pain flare likely due to increase of activity. Patient has had cortisone injection recently. Plan to control pain with Norco and followup with primary care provider or sports medicine if not improving. Discussed warning signs or symptoms. Please see discharge instructions. Patient expresses understanding.      Rodolph Bong, MD 06/17/13 2016

## 2013-06-17 NOTE — ED Notes (Signed)
Pt triaged and assessed by provider.   Provider in before nurse. 

## 2013-07-12 ENCOUNTER — Ambulatory Visit: Payer: PRIVATE HEALTH INSURANCE | Admitting: Family Medicine

## 2013-07-19 ENCOUNTER — Ambulatory Visit: Payer: PRIVATE HEALTH INSURANCE | Admitting: Family Medicine

## 2013-07-27 ENCOUNTER — Ambulatory Visit (INDEPENDENT_AMBULATORY_CARE_PROVIDER_SITE_OTHER): Payer: PRIVATE HEALTH INSURANCE | Admitting: Family Medicine

## 2013-07-27 ENCOUNTER — Encounter: Payer: Self-pay | Admitting: Family Medicine

## 2013-07-27 VITALS — BP 128/84 | HR 67 | Temp 98.1°F | Ht 73.0 in | Wt 325.0 lb

## 2013-07-27 DIAGNOSIS — J069 Acute upper respiratory infection, unspecified: Secondary | ICD-10-CM

## 2013-07-27 DIAGNOSIS — I10 Essential (primary) hypertension: Secondary | ICD-10-CM

## 2013-07-27 DIAGNOSIS — M5412 Radiculopathy, cervical region: Secondary | ICD-10-CM

## 2013-07-27 DIAGNOSIS — M5010 Cervical disc disorder with radiculopathy, unspecified cervical region: Secondary | ICD-10-CM | POA: Insufficient documentation

## 2013-07-27 LAB — BASIC METABOLIC PANEL
CO2: 27 mEq/L (ref 19–32)
Glucose, Bld: 87 mg/dL (ref 70–99)
Potassium: 4.2 mEq/L (ref 3.5–5.3)
Sodium: 138 mEq/L (ref 135–145)

## 2013-07-27 MED ORDER — IPRATROPIUM BROMIDE 0.06 % NA SOLN: 2.0000 | Freq: Four times a day (QID) | NASAL | Status: DC

## 2013-07-27 MED ORDER — LISINOPRIL 5 MG PO TABS: 5.0000 mg | ORAL_TABLET | Freq: Every day | ORAL | Status: DC

## 2013-07-27 MED ORDER — CYCLOBENZAPRINE HCL 10 MG PO TABS: 10.0000 mg | ORAL_TABLET | Freq: Two times a day (BID) | ORAL | Status: DC | PRN

## 2013-07-27 MED ORDER — KETOROLAC TROMETHAMINE 60 MG/2ML IM SOLN
60.0000 mg | Freq: Once | INTRAMUSCULAR | Status: AC
  Administered 2013-07-27: 60 mg via INTRAMUSCULAR

## 2013-07-27 MED ORDER — AMOXICILLIN 875 MG PO TABS: 875.0000 mg | ORAL_TABLET | Freq: Two times a day (BID) | ORAL | Status: DC

## 2013-07-27 MED ORDER — TRAMADOL HCL 50 MG PO TABS: 50.0000 mg | ORAL_TABLET | Freq: Four times a day (QID) | ORAL | Status: DC | PRN

## 2013-07-27 NOTE — Assessment & Plan Note (Signed)
spurling + on L w/ symptomatic arm C-spine film H/o C spine [employer] flexeril to 10mg  TID PRN Cont gaba Cont tramadol Toradol in office

## 2013-07-27 NOTE — Assessment & Plan Note (Signed)
Symptoms ongoing for greater than 3 wks.  Likely converted from viral to bacterial etiology Unlikely allergy related.  AMox and intranasal atrovent.

## 2013-07-27 NOTE — Addendum Note (Signed)
Addended by: Garen Grams F on: 07/27/2013 10:39 AM   Modules accepted: Orders

## 2013-07-27 NOTE — Patient Instructions (Signed)
Thank you for coming in today Your arm symptoms are from cervical radiculopathy Pelase go get xrays done Please start your exercises Please increase your flexeril to 10mg  three times a day as needed Please start the antnibiotics and intranasal atrovent.  Please come back to see me in 1 month if you are not better.   Cervical Strain and Sprain (Whiplash) with Rehab Cervical strain and sprains are injuries that commonly occur with "whiplash" injuries. Whiplash occurs when the neck is forcefully whipped backward or forward, such as during a motor vehicle accident. The muscles, ligaments, tendons, discs and nerves of the neck are susceptible to injury when this occurs. SYMPTOMS   Pain or stiffness in the front and/or back of neck  Symptoms may present immediately or up to 24 hours after injury.  Dizziness, headache, nausea and vomiting.  Muscle spasm with soreness and stiffness in the neck.  Tenderness and swelling at the injury site. CAUSES  Whiplash injuries often occur during contact sports or motor vehicle accidents.  RISK INCREASES WITH:  Osteoarthritis of the spine.  Situations that make head or neck accidents or trauma more likely.  High-risk sports (football, rugby, wrestling, hockey, auto racing, gymnastics, diving, contact karate or boxing).  Poor strength and flexibility of the neck.  Previous neck injury.  Poor tackling technique.  Improperly fitted or padded equipment. PREVENTION  Learn and use proper technique (avoid tackling with the head, spearing and head-butting; use proper falling techniques to avoid landing on the head).  Warm up and stretch properly before activity.  Maintain physical fitness:  Strength, flexibility and endurance.  Cardiovascular fitness.  Wear properly fitted and padded protective equipment, such as padded soft collars, for participation in contact sports. PROGNOSIS  Recovery for cervical strain and sprain injuries is dependent  on the extent of the injury. These injuries are usually curable in 1 week to 3 months with appropriate treatment.  RELATED COMPLICATIONS   Temporary numbness and weakness may occur if the nerve roots are damaged, and this may persist until the nerve has completely healed.  Chronic pain due to frequent recurrence of symptoms.  Prolonged healing, especially if activity is resumed too soon (before complete recovery). TREATMENT  Treatment initially involves the use of ice and medication to help reduce pain and inflammation. It is also important to perform strengthening and stretching exercises and modify activities that worsen symptoms so the injury does not get worse. These exercises may be performed at home or with a therapist. For patients who experience severe symptoms, a soft padded collar may be recommended to be worn around the neck.  Improving your posture may help reduce symptoms. Posture improvement includes pulling your chin and abdomen in while sitting or standing. If you are sitting, sit in a firm chair with your buttocks against the back of the chair. While sleeping, try replacing your pillow with a small towel rolled to 2 inches in diameter, or use a cervical pillow or soft cervical collar. Poor sleeping positions delay healing.  For patients with nerve root damage, which causes numbness or weakness, the use of a cervical traction apparatus may be recommended. Surgery is rarely necessary for these injuries. However, cervical strain and sprains that are present at birth (congenital) may require surgery. MEDICATION   If pain medication is necessary, nonsteroidal anti-inflammatory medications, such as aspirin and ibuprofen, or other minor pain relievers, such as acetaminophen, are often recommended.  Do not take pain medication for 7 days before surgery.  Prescription pain relievers  may be given if deemed necessary by your caregiver. Use only as directed and only as much as you need. HEAT  AND COLD:   Cold treatment (icing) relieves pain and reduces inflammation. Cold treatment should be applied for 10 to 15 minutes every 2 to 3 hours for inflammation and pain and immediately after any activity that aggravates your symptoms. Use ice packs or an ice massage.  Heat treatment may be used prior to performing the stretching and strengthening activities prescribed by your caregiver, physical therapist, or athletic trainer. Use a heat pack or a warm soak. SEEK MEDICAL CARE IF:   Symptoms get worse or do not improve in 2 weeks despite treatment.  New, unexplained symptoms develop (drugs used in treatment may produce side effects). EXERCISES RANGE OF MOTION (ROM) AND STRETCHING EXERCISES - Cervical Strain and Sprain These exercises may help you when beginning to rehabilitate your injury. In order to successfully resolve your symptoms, you must improve your posture. These exercises are designed to help reduce the forward-head and rounded-shoulder posture which contributes to this condition. Your symptoms may resolve with or without further involvement from your physician, physical therapist or athletic trainer. While completing these exercises, remember:   Restoring tissue flexibility helps normal motion to return to the joints. This allows healthier, less painful movement and activity.  An effective stretch should be held for at least 20 seconds, although you may need to begin with shorter hold times for comfort.  A stretch should never be painful. You should only feel a gentle lengthening or release in the stretched tissue. STRETCH- Axial Extensors  Lie on your back on the floor. You may bend your knees for comfort. Place a rolled up hand towel or dish towel, about 2 inches in diameter, under the part of your head that makes contact with the floor.  Gently tuck your chin, as if trying to make a "double chin," until you feel a gentle stretch at the base of your head.  Hold __________  seconds. Repeat __________ times. Complete this exercise __________ times per day.  STRETECH - Axial Extension   Stand or sit on a firm surface. Assume a good posture: chest up, shoulders drawn back, abdominal muscles slightly tense, knees unlocked (if standing) and feet hip width apart.  Slowly retract your chin so your head slides back and your chin slightly lowers.Continue to look straight ahead.  You should feel a gentle stretch in the back of your head. Be certain not to feel an aggressive stretch since this can cause headaches later.  Hold for __________ seconds. Repeat __________ times. Complete this exercise __________ times per day. STRETCH  Cervical Side Bend   Stand or sit on a firm surface. Assume a good posture: chest up, shoulders drawn back, abdominal muscles slightly tense, knees unlocked (if standing) and feet hip width apart.  Without letting your nose or shoulders move, slowly tip your right / left ear to your shoulder until your feel a gentle stretch in the muscles on the opposite side of your neck.  Hold __________ seconds. Repeat __________ times. Complete this exercise __________ times per day. STRETCH  Cervical Rotators   Stand or sit on a firm surface. Assume a good posture: chest up, shoulders drawn back, abdominal muscles slightly tense, knees unlocked (if standing) and feet hip width apart.  Keeping your eyes level with the ground, slowly turn your head until you feel a gentle stretch along the back and opposite side of your neck.  Hold  __________ seconds. Repeat __________ times. Complete this exercise __________ times per day. RANGE OF MOTION - Neck Circles   Stand or sit on a firm surface. Assume a good posture: chest up, shoulders drawn back, abdominal muscles slightly tense, knees unlocked (if standing) and feet hip width apart.  Gently roll your head down and around from the back of one shoulder to the back of the other. The motion should never be  forced or painful.  Repeat the motion 10-20 times, or until you feel the neck muscles relax and loosen. Repeat __________ times. Complete the exercise __________ times per day. STRENGTHENING EXERCISES - Cervical Strain and Sprain These exercises may help you when beginning to rehabilitate your injury. They may resolve your symptoms with or without further involvement from your physician, physical therapist or athletic trainer. While completing these exercises, remember:   Muscles can gain both the endurance and the strength needed for everyday activities through controlled exercises.  Complete these exercises as instructed by your physician, physical therapist or athletic trainer. Progress the resistance and repetitions only as guided.  You may experience muscle soreness or fatigue, but the pain or discomfort you are trying to eliminate should never worsen during these exercises. If this pain does worsen, stop and make certain you are following the directions exactly. If the pain is still present after adjustments, discontinue the exercise until you can discuss the trouble with your clinician. STRENGTH Cervical Flexors, Isometric  Face a wall, standing about 6 inches away. Place a small pillow, a ball about 6-8 inches in diameter, or a folded towel between your forehead and the wall.  Slightly tuck your chin and gently push your forehead into the soft object. Push only with mild to moderate intensity, building up tension gradually. Keep your jaw and forehead relaxed.  Hold 10 to 20 seconds. Keep your breathing relaxed.  Release the tension slowly. Relax your neck muscles completely before you start the next repetition. Repeat __________ times. Complete this exercise __________ times per day. STRENGTH- Cervical Lateral Flexors, Isometric   Stand about 6 inches away from a wall. Place a small pillow, a ball about 6-8 inches in diameter, or a folded towel between the side of your head and the  wall.  Slightly tuck your chin and gently tilt your head into the soft object. Push only with mild to moderate intensity, building up tension gradually. Keep your jaw and forehead relaxed.  Hold 10 to 20 seconds. Keep your breathing relaxed.  Release the tension slowly. Relax your neck muscles completely before you start the next repetition. Repeat __________ times. Complete this exercise __________ times per day. STRENGTH  Cervical Extensors, Isometric   Stand about 6 inches away from a wall. Place a small pillow, a ball about 6-8 inches in diameter, or a folded towel between the back of your head and the wall.  Slightly tuck your chin and gently tilt your head back into the soft object. Push only with mild to moderate intensity, building up tension gradually. Keep your jaw and forehead relaxed.  Hold 10 to 20 seconds. Keep your breathing relaxed.  Release the tension slowly. Relax your neck muscles completely before you start the next repetition. Repeat __________ times. Complete this exercise __________ times per day. POSTURE AND BODY MECHANICS CONSIDERATIONS - Cervical Strain and Sprain Keeping correct posture when sitting, standing or completing your activities will reduce the stress put on different body tissues, allowing injured tissues a chance to heal and limiting painful experiences. The  following are general guidelines for improved posture. Your physician or physical therapist will provide you with any instructions specific to your needs. While reading these guidelines, remember:  The exercises prescribed by your provider will help you have the flexibility and strength to maintain correct postures.  The correct posture provides the optimal environment for your joints to work. All of your joints have less wear and tear when properly supported by a spine with good posture. This means you will experience a healthier, less painful body.  Correct posture must be practiced with all of  your activities, especially prolonged sitting and standing. Correct posture is as important when doing repetitive low-stress activities (typing) as it is when doing a single heavy-load activity (lifting). PROLONGED STANDING WHILE SLIGHTLY LEANING FORWARD When completing a task that requires you to lean forward while standing in one place for a long time, place either foot up on a stationary 2-4 inch high object to help maintain the best posture. When both feet are on the ground, the low back tends to lose its slight inward curve. If this curve flattens (or becomes too large), then the back and your other joints will experience too much stress, fatigue more quickly and can cause pain.  RESTING POSITIONS Consider which positions are most painful for you when choosing a resting position. If you have pain with flexion-based activities (sitting, bending, stooping, squatting), choose a position that allows you to rest in a less flexed posture. You would want to avoid curling into a fetal position on your side. If your pain worsens with extension-based activities (prolonged standing, working overhead), avoid resting in an extended position such as sleeping on your stomach. Most people will find more comfort when they rest with their spine in a more neutral position, neither too rounded nor too arched. Lying on a non-sagging bed on your side with a pillow between your knees, or on your back with a pillow under your knees will often provide some relief. Keep in mind, being in any one position for a prolonged period of time, no matter how correct your posture, can still lead to stiffness. WALKING Walk with an upright posture. Your ears, shoulders and hips should all line-up. OFFICE WORK When working at a desk, create an environment that supports good, upright posture. Without extra support, muscles fatigue and lead to excessive strain on joints and other tissues. CHAIR:  A chair should be able to slide under your  desk when your back makes contact with the back of the chair. This allows you to work closely.  The chair's height should allow your eyes to be level with the upper part of your monitor and your hands to be slightly lower than your elbows.  Body position:  Your feet should make contact with the floor. If this is not possible, use a foot rest.  Keep your ears over your shoulders. This will reduce stress on your neck and low back. Document Released: 08/11/2005 Document Revised: 12/06/2012 Document Reviewed: 11/23/2008 [facility] Patient Information 2014 [place], [place].

## 2013-07-27 NOTE — Assessment & Plan Note (Signed)
Well controlled  Refill lisinopril BMET to check Cr and K

## 2013-07-27 NOTE — Progress Notes (Signed)
Julian Lucas is a 37 y.o. male who presents to [facility] today for sciatica and URI  URI: 3 wks ago onset. Cough. Wakes up at night. Associated w/ runny nose. Getting worse. No allergies. Wife w/ similar symptoms. Denies fevers, chills, nausea, sinus congestion.   L arm numb and tingling: started 1 wk ago. Starts in shoulder area and runs down arm to hand. Continuous. Worse w/ certain movements. Associated w/ intermittent loss of strength. Denies szrs, LOC, trauma.   Chronic pain. Not using voltraren gel. counselded to start  The following portions of the patient's history were reviewed and updated as appropriate: allergies, current medications, past medical history, family and social history, and problem list.  Patient is a smoker  Past Medical History  Diagnosis Date  . Hypertension   . HERPES GENITALIS 11/21/2009    Qualifier: Diagnosis of  By: Constance Goltz MD, Molli Hazard    . PULMONARY EMBOLISM, HX OF 11/21/2009    Qualifier: Diagnosis of  By: Lelon Perla MD, Vickki Muff    . UNEQUAL LEG LENGTH 08/09/2010    Qualifier: Diagnosis of  By: Alvester Morin MD, Viviann Spare    . DEGENERATIVE JOINT DISEASE, LEFT KNEE 09/09/2010    Qualifier: Diagnosis of  By: FIELDS MD, KARL      ROS as above otherwise neg.    Medications reviewed. Current Outpatient Prescriptions  Medication Sig Dispense Refill  . cyclobenzaprine (FLEXERIL) 10 MG tablet Take 1 tablet (10 mg total) by mouth 2 (two) times daily as needed for muscle spasms.  30 tablet  0  . diclofenac (VOLTAREN) 75 MG EC tablet Take 1 tablet (75 mg total) by mouth 2 (two) times daily as needed.  60 tablet  3  . gabapentin (NEURONTIN) 300 MG capsule Take 1 capsule (300 mg total) by mouth 2 (two) times daily.  180 capsule  3  . HYDROcodone-acetaminophen (NORCO/VICODIN) 5-325 MG per tablet Take 1 tablet by mouth every 6 (six) hours as needed for pain.  20 tablet  0  . lisinopril (PRINIVIL,ZESTRIL) 5 MG tablet Take 5 mg by mouth daily.      Marland Kitchen LISINOPRIL PO Take by mouth.      .  traMADol (ULTRAM) 50 MG tablet Take 1 tablet (50 mg total) by mouth every 6 (six) hours as needed for pain.  90 tablet  1   No current facility-administered medications for this visit.    Exam: BP 128/84  Pulse 67  Temp(Src) 98.1 F (36.7 C) (Oral)  Ht 6\' 1"  (1.854 m)  Wt 325 lb (147.419 kg)  BMI 42.89 kg/m2 Gen: Well NAD HEENT: EOMI,  MMM MSK: FROM of L arm. Grip strength 4/5 on L. Spurlings positive on L non-ttp of L arm/shoulder  No results found for this or any previous visit (from the past 72 hour(s)).

## 2013-08-30 ENCOUNTER — Emergency Department (HOSPITAL_COMMUNITY): Payer: PRIVATE HEALTH INSURANCE

## 2013-08-30 ENCOUNTER — Emergency Department (HOSPITAL_COMMUNITY)
Admission: EM | Admit: 2013-08-30 | Discharge: 2013-08-30 | Disposition: A | Discharge status: Home / Self Care | Payer: PRIVATE HEALTH INSURANCE | Attending: Emergency Medicine | Admitting: Emergency Medicine

## 2013-08-30 ENCOUNTER — Encounter (HOSPITAL_COMMUNITY): Payer: Self-pay | Admitting: Emergency Medicine

## 2013-08-30 DIAGNOSIS — Z8739 Personal history of other diseases of the musculoskeletal system and connective tissue: Secondary | ICD-10-CM | POA: Insufficient documentation

## 2013-08-30 DIAGNOSIS — R197 Diarrhea, unspecified: Secondary | ICD-10-CM | POA: Insufficient documentation

## 2013-08-30 DIAGNOSIS — Z8619 Personal history of other infectious and parasitic diseases: Secondary | ICD-10-CM | POA: Insufficient documentation

## 2013-08-30 DIAGNOSIS — I10 Essential (primary) hypertension: Secondary | ICD-10-CM | POA: Insufficient documentation

## 2013-08-30 DIAGNOSIS — F17200 Nicotine dependence, unspecified, uncomplicated: Secondary | ICD-10-CM | POA: Insufficient documentation

## 2013-08-30 DIAGNOSIS — R69 Illness, unspecified: Secondary | ICD-10-CM

## 2013-08-30 DIAGNOSIS — J111 Influenza due to unidentified influenza virus with other respiratory manifestations: Secondary | ICD-10-CM | POA: Insufficient documentation

## 2013-08-30 DIAGNOSIS — Z9089 Acquired absence of other organs: Secondary | ICD-10-CM | POA: Insufficient documentation

## 2013-08-30 DIAGNOSIS — Z86711 Personal history of pulmonary embolism: Secondary | ICD-10-CM | POA: Insufficient documentation

## 2013-08-30 DIAGNOSIS — IMO0001 Reserved for inherently not codable concepts without codable children: Secondary | ICD-10-CM | POA: Insufficient documentation

## 2013-08-30 DIAGNOSIS — R52 Pain, unspecified: Secondary | ICD-10-CM | POA: Insufficient documentation

## 2013-08-30 DIAGNOSIS — R112 Nausea with vomiting, unspecified: Secondary | ICD-10-CM | POA: Insufficient documentation

## 2013-08-30 DIAGNOSIS — Z79899 Other long term (current) drug therapy: Secondary | ICD-10-CM | POA: Insufficient documentation

## 2013-08-30 LAB — CBC WITH DIFFERENTIAL/PLATELET
BASOS PCT: 0 % (ref 0–1)
Basophils Absolute: 0 10*3/uL (ref 0.0–0.1)
Eosinophils Absolute: 0.1 10*3/uL (ref 0.0–0.7)
Eosinophils Relative: 2 % (ref 0–5)
HCT: 45.2 % (ref 39.0–52.0)
HEMOGLOBIN: 15.4 g/dL (ref 13.0–17.0)
LYMPHS PCT: 23 % (ref 12–46)
Lymphs Abs: 1.6 10*3/uL (ref 0.7–4.0)
MCH: 31.5 pg (ref 26.0–34.0)
MCHC: 34.1 g/dL (ref 30.0–36.0)
MCV: 92.4 fL (ref 78.0–100.0)
Monocytes Absolute: 1.2 10*3/uL — ABNORMAL HIGH (ref 0.1–1.0)
Monocytes Relative: 17 % — ABNORMAL HIGH (ref 3–12)
NEUTROS PCT: 58 % (ref 43–77)
Neutro Abs: 4 10*3/uL (ref 1.7–7.7)
Platelets: 217 10*3/uL (ref 150–400)
RBC: 4.89 MIL/uL (ref 4.22–5.81)
RDW: 12.7 % (ref 11.5–15.5)
WBC: 6.9 10*3/uL (ref 4.0–10.5)

## 2013-08-30 LAB — BASIC METABOLIC PANEL
BUN: 9 mg/dL (ref 6–23)
CHLORIDE: 104 meq/L (ref 96–112)
CO2: 21 mEq/L (ref 19–32)
Calcium: 9.1 mg/dL (ref 8.4–10.5)
Creatinine, Ser: 0.63 mg/dL (ref 0.50–1.35)
GFR calc non Af Amer: >90 mL/min (ref >90)
Glucose, Bld: 99 mg/dL (ref 70–99)
Potassium: 3.6 mEq/L — ABNORMAL LOW (ref 3.7–5.3)
Sodium: 141 mEq/L (ref 137–147)

## 2013-08-30 MED ORDER — OXYCODONE-ACETAMINOPHEN 5-325 MG PO TABS
1.0000 | ORAL_TABLET | Freq: Once | ORAL | Status: AC
  Administered 2013-08-30: 1 via ORAL
  Filled 2013-08-30: qty 1

## 2013-08-30 MED ORDER — HYDROCODONE-HOMATROPINE 5-1.5 MG/5ML PO SYRP
5.0000 mL | ORAL_SOLUTION | Freq: Four times a day (QID) | ORAL | Status: DC | PRN
Start: 2013-08-30 — End: 2013-10-14

## 2013-08-30 MED ORDER — PROMETHAZINE HCL 25 MG PO TABS: 25.0000 mg | ORAL_TABLET | Freq: Four times a day (QID) | ORAL | Status: DC | PRN

## 2013-08-30 MED ORDER — ALBUTEROL SULFATE (2.5 MG/3ML) 0.083% IN NEBU
5.0000 mg | INHALATION_SOLUTION | Freq: Once | RESPIRATORY_TRACT | Status: AC
  Administered 2013-08-30: 5 mg via RESPIRATORY_TRACT
  Filled 2013-08-30: qty 6

## 2013-08-30 MED ORDER — IBUPROFEN 200 MG PO TABS
400.0000 mg | ORAL_TABLET | Freq: Once | ORAL | Status: AC
  Administered 2013-08-30: 400 mg via ORAL
  Filled 2013-08-30: qty 2

## 2013-08-30 MED ORDER — ONDANSETRON 4 MG PO TBDP
8.0000 mg | ORAL_TABLET | Freq: Once | ORAL | Status: AC
Start: 2013-08-30 — End: 2013-08-30
  Administered 2013-08-30: 8 mg via ORAL
  Filled 2013-08-30: qty 2

## 2013-08-30 MED ORDER — OSELTAMIVIR PHOSPHATE 75 MG PO CAPS: 75.0000 mg | ORAL_CAPSULE | Freq: Two times a day (BID) | ORAL | Status: DC

## 2013-08-30 NOTE — ED Notes (Signed)
PA at bedside.

## 2013-08-30 NOTE — ED Notes (Signed)
PBT at [facility], neb in progress, intermittent cough noted.

## 2013-08-30 NOTE — ED Notes (Addendum)
C/o body aches, cough, nvd. Hot & cold chills. Last emesis, ~ 1hr ago. vomiting only with cough. Last BM ~ 2 hrs ago (diarrhea, watery). Unsure of fever. Has tried mucinex and robitussin w/o relief. Last cigarette 2 hrs ago.

## 2013-08-30 NOTE — ED Provider Notes (Signed)
CSN: [id number]     Arrival date & time 08/30/13  0342 History   First MD Initiated Contact with Patient 08/30/13 [phone]     Chief Complaint  Patient presents with  . Generalized Body Aches  . Cough  . Emesis  . Diarrhea   (Consider location/radiation/quality/duration/timing/severity/associated sxs/prior Treatment) Patient is a 38 y.o. male presenting with cough, vomiting, and diarrhea. The history is provided by the patient. No language interpreter was used.  Cough Cough characteristics:  Productive Sputum characteristics:  [place] Severity:  Moderate Onset quality:  Gradual Duration:  2 days Timing:  Constant Progression:  Worsening Chronicity:  New Smoker: yes   Context: sick contacts   Context: not animal exposure, not exposure to allergens and not occupational exposure   Relieved by:  Nothing Worsened by:  Nothing tried Associated symptoms: fever and myalgias   Associated symptoms: no chest pain, no chills and no shortness of breath   Fever:    Duration:  2 days   Timing:  Constant   Max temp PTA (F):  Unknown   Temp source:  Subjective   Progression:  Waxing and waning Myalgias:    Location:  Generalized   Quality:  Aching   Severity:  Moderate   Onset quality:  Gradual   Duration:  2 days   Timing:  Constant   Progression:  Worsening Risk factors: no chemical exposure and no recent travel   Emesis Severity:  Moderate Duration:  2 days Timing:  Intermittent Number of daily episodes:  3 Quality:  Stomach contents Able to tolerate:  Liquids Progression:  Unchanged Chronicity:  New Recent urination:  Normal Context: post-tussive   Relieved by:  Nothing Worsened by:  Nothing tried Ineffective treatments:  None tried Associated symptoms: diarrhea and myalgias   Associated symptoms: no abdominal pain, no arthralgias and no chills   Diarrhea:    Quality:  Watery   Number of occurrences:  3   Severity:  Moderate   Duration:  2 days   Timing:  Intermittent    Progression:  Unchanged Risk factors: sick contacts   Risk factors: no alcohol use, no suspect food intake and no travel to endemic areas   Diarrhea Associated symptoms: fever, myalgias and vomiting   Associated symptoms: no abdominal pain, no arthralgias and no chills     Past Medical History  Diagnosis Date  . Hypertension   . HERPES GENITALIS 11/21/2009    Qualifier: Diagnosis of  By: Jeannine Kitten MD, Rodman Key    . PULMONARY EMBOLISM, HX OF 11/21/2009    Qualifier: Diagnosis of  By: Tye Savoy MD, Tommi Rumps    . UNEQUAL LEG LENGTH 08/09/2010    Qualifier: Diagnosis of  By: Ernestina Patches MD, Remo Lipps    . DEGENERATIVE JOINT DISEASE, LEFT KNEE 09/09/2010    Qualifier: Diagnosis of  By: Oneida Alar MD, KARL     Past Surgical History  Procedure Laterality Date  . Knee surgery    . Neck surgery    . Tonsilectomy, adenoidectomy, bilateral myringotomy and tubes     Family History  Problem Relation Age of Onset  . Lung cancer Father   . Depression Mother   . Hypertension Mother   . Heart disease Mother    History  Substance Use Topics  . Smoking status: Current Every Day Smoker -- 0.30 packs/day    Types: Cigarettes  . Smokeless tobacco: Not on file     Comment: 6 cigs a day  . Alcohol Use: Yes    Review of  Systems  Constitutional: Positive for fever. Negative for chills and fatigue.  HENT: Negative for trouble swallowing.   Eyes: Negative for visual disturbance.  Respiratory: Positive for cough. Negative for shortness of breath.   Cardiovascular: Negative for chest pain and palpitations.  Gastrointestinal: Positive for nausea, vomiting and diarrhea. Negative for abdominal pain.  Genitourinary: Negative for dysuria and difficulty urinating.  Musculoskeletal: Positive for myalgias. Negative for arthralgias and neck pain.  Skin: Negative for color change.  Neurological: Negative for dizziness and weakness.  Psychiatric/Behavioral: Negative for dysphoric mood.    Allergies  Review of patient's  allergies indicates no known allergies.  Home Medications   Current Outpatient Rx  Name  Route  Sig  Dispense  Refill  . gabapentin (NEURONTIN) 300 MG capsule   Oral   Take 1 capsule (300 mg total) by mouth 2 (two) times daily.   180 capsule   3   . ipratropium (ATROVENT) 0.06 % nasal spray   Each Nare   Place 2 sprays into both nostrils 4 (four) times daily.   15 mL   12   . lisinopril (PRINIVIL,ZESTRIL) 5 MG tablet   Oral   Take 1 tablet (5 mg total) by mouth daily.   90 tablet   3   . traMADol (ULTRAM) 50 MG tablet   Oral   Take 1 tablet (50 mg total) by mouth every 6 (six) hours as needed.   90 tablet   1    BP 146/93  Pulse 80  Temp(Src) 99.6 F (37.6 C) (Oral)  Resp 18  SpO2 96% Physical Exam  Nursing note and vitals reviewed. Constitutional: He is oriented to person, place, and time. He appears well-developed and well-nourished. No distress.  HENT:  Head: Normocephalic and atraumatic.  Mouth/Throat: Oropharynx is clear and moist. No oropharyngeal exudate.  Eyes: Conjunctivae and EOM are normal.  Neck: Normal range of motion.  Cardiovascular: Normal rate and regular rhythm.  Exam reveals no gallop and no friction rub.   No murmur heard. Pulmonary/Chest: Effort normal and breath sounds normal. He has no wheezes. He has no rales. He exhibits no tenderness.  Abdominal: Soft. He exhibits no distension. There is no tenderness. There is no rebound and no guarding.  Musculoskeletal: Normal range of motion.  Neurological: He is alert and oriented to person, place, and time. Coordination normal.  Speech is goal-oriented. Moves limbs without ataxia.   Skin: Skin is warm and dry.  Psychiatric: He has a normal mood and affect. His behavior is normal.    ED Course  Procedures (including critical care time) Labs Review Labs Reviewed  BASIC METABOLIC PANEL - Abnormal; Notable for the following:    Potassium 3.6 (*)    All other components within normal limits   CBC WITH DIFFERENTIAL - Abnormal; Notable for the following:    Monocytes Relative 17 (*)    Monocytes Absolute 1.2 (*)    All other components within normal limits   Imaging Review Dg Chest 2 View  08/30/2013   CLINICAL DATA:  Generalized body aches  EXAM: CHEST  2 VIEW  COMPARISON:  03/10/2011  FINDINGS: The heart size and mediastinal contours are within normal limits. Both lungs are clear. The visualized skeletal structures are unremarkable.  IMPRESSION: No active cardiopulmonary disease.   Electronically Signed   By: Jorje Guild M.D.   On: 08/30/2013 05:46    EKG Interpretation   None       MDM   1. Influenza-like illness  6:52 AM Labs and chest xray unremarkable for acute changes. Patient has a low grade temp on arrival. Patient reports no improvement in symptoms since arrival and medications. Patient has not vomited recently. Patient likely has a viral illness or influenza like illness. Patient will be started on tamiflu and have zofran and hycodan prescriptions. Vitals stable and patient afebrile. Patient instructed to return with worsening or concerning symptoms.    Alvina Chou, PA-C 08/30/13 [phone]

## 2013-08-30 NOTE — ED Provider Notes (Signed)
Medical screening examination/treatment/procedure(s) were performed by non-physician practitioner and as supervising physician I was immediately available for consultation/collaboration.  EKG Interpretation   None        Courtney F Horton, MD 08/30/13 1749 

## 2013-08-30 NOTE — ED Notes (Signed)
Pt states he has felt very weak the past couple of days but that he had to leave work early yesterday because he felt so ill.

## 2013-08-30 NOTE — Discharge Instructions (Signed)
Take Tamiflu as directed until gone. Take Hycodan as needed for cough. Take Phenergan as needed for nausea. Refer to attached documents for more information. Return to the ED with worsening or concerning symptoms.

## 2013-10-14 ENCOUNTER — Ambulatory Visit (INDEPENDENT_AMBULATORY_CARE_PROVIDER_SITE_OTHER): Payer: PRIVATE HEALTH INSURANCE | Admitting: Family Medicine

## 2013-10-14 VITALS — BP 134/80 | HR 71 | Temp 98.5°F | Wt 312.0 lb

## 2013-10-14 DIAGNOSIS — M2550 Pain in unspecified joint: Secondary | ICD-10-CM

## 2013-10-14 DIAGNOSIS — M5020 Other cervical disc displacement, unspecified cervical region: Secondary | ICD-10-CM

## 2013-10-14 DIAGNOSIS — G8929 Other chronic pain: Secondary | ICD-10-CM

## 2013-10-14 DIAGNOSIS — F17200 Nicotine dependence, unspecified, uncomplicated: Secondary | ICD-10-CM

## 2013-10-14 DIAGNOSIS — J069 Acute upper respiratory infection, unspecified: Secondary | ICD-10-CM

## 2013-10-14 DIAGNOSIS — I10 Essential (primary) hypertension: Secondary | ICD-10-CM

## 2013-10-14 MED ORDER — TRAMADOL HCL 50 MG PO TABS: 100.0000 mg | ORAL_TABLET | Freq: Four times a day (QID) | ORAL | Status: DC | PRN

## 2013-10-14 MED ORDER — LISINOPRIL 5 MG PO TABS: 5.0000 mg | ORAL_TABLET | Freq: Every day | ORAL | Status: AC

## 2013-10-14 MED ORDER — CYCLOBENZAPRINE HCL 5 MG PO TABS: 5.0000 mg | ORAL_TABLET | Freq: Two times a day (BID) | ORAL | Status: AC | PRN

## 2013-10-14 MED ORDER — TRAMADOL HCL 50 MG PO TABS: 100.0000 mg | ORAL_TABLET | Freq: Two times a day (BID) | ORAL | Status: DC | PRN

## 2013-10-14 MED ORDER — GABAPENTIN 300 MG PO CAPS: 300.0000 mg | ORAL_CAPSULE | Freq: Two times a day (BID) | ORAL | Status: DC

## 2013-10-14 NOTE — Assessment & Plan Note (Signed)
Resovled.

## 2013-10-14 NOTE — Assessment & Plan Note (Signed)
At goal.  No chagne

## 2013-10-14 NOTE — Patient Instructions (Signed)
Thank you for coming in today.  I'm truly sorry to hear how things are going at home Please only take one tramadol at night Please increase your gabapentin to twice a day Please apply for new insurance and come back to see me when you are able

## 2013-10-14 NOTE — Assessment & Plan Note (Signed)
Increased use due to stress

## 2013-10-14 NOTE — Assessment & Plan Note (Signed)
Refill gabapentin and tramadol and flexeril Pt going through divorce and will be w/o insurance for some time so will refill meds to give 3 mo supply today Pt to apply for insurance through [facility] or for orange card.

## 2013-10-14 NOTE — Progress Notes (Signed)
Julian Lucas is a 38 y.o. male who presents to [facility] today for back pain  PT [place] HIM OFF HER INSURANCE.   Back pain: off and on relieved w/ tramadol and flexeril. Using 2 tramadol daily adn 1 gabapentin daily. Taking flexeril QHS to help sleep  Knee pain: finally went to ortho and given knee brace to hold off knee replacement secondaryt o severe OA.   Tobacco: smoking mostly cigars. Increased use due to stress.   The following portions of the patient's history were reviewed and updated as appropriate: allergies, current medications, past medical history, family and social history, and problem list.  Patient is a nonsmoker.  Past Medical History  Diagnosis Date  . Hypertension   . HERPES GENITALIS 11/21/2009    Qualifier: Diagnosis of  By: Jeannine Kitten MD, Rodman Key    . PULMONARY EMBOLISM, HX OF 11/21/2009    Qualifier: Diagnosis of  By: Tye Savoy MD, Tommi Rumps    . UNEQUAL LEG LENGTH 08/09/2010    Qualifier: Diagnosis of  By: Ernestina Patches MD, Remo Lipps    . DEGENERATIVE JOINT DISEASE, LEFT KNEE 09/09/2010    Qualifier: Diagnosis of  By: FIELDS MD, KARL      ROS as above otherwise neg.    Medications reviewed. Current Outpatient Prescriptions  Medication Sig Dispense Refill  . cyclobenzaprine (FLEXERIL) 5 MG tablet Take 1 tablet (5 mg total) by mouth 2 (two) times daily as needed.  90 tablet  6  . gabapentin (NEURONTIN) 300 MG capsule Take 1 capsule (300 mg total) by mouth 2 (two) times daily.  180 capsule  6  . guaifenesin (ROBITUSSIN) 100 MG/5ML syrup Take 200 mg by mouth 3 (three) times daily as needed for cough.      Marland Kitchen lisinopril (PRINIVIL,ZESTRIL) 5 MG tablet Take 1 tablet (5 mg total) by mouth daily.  90 tablet  3  . traMADol (ULTRAM) 50 MG tablet Take 2 tablets (100 mg total) by mouth every 12 (twelve) hours as needed.  120 tablet  2   No current facility-administered medications for this visit.    Exam:  BP 134/80  Pulse 71  Temp(Src) 98.5 F (36.9 C)  (Oral)  Wt 312 lb (141.522 kg) Gen: Well NAD HEENT: EOMI,  MMM   No results found for this or any previous visit (from the past 72 hour(s)).  A/P (as seen in Problem list)  URI (upper respiratory infection) Resovled  TOBACCO ABUSE Increased use due to stress   HYPERTENSION, BENIGN ESSENTIAL At goal.  No chagne  Chronic pain of multiple joints Refill gabapentin and tramadol and flexeril Pt going through divorce and will be w/o insurance for some time so will refill meds to give 3 mo supply today Pt to apply for insurance through [facility] or for orange card.

## 2013-11-18 ENCOUNTER — Ambulatory Visit (INDEPENDENT_AMBULATORY_CARE_PROVIDER_SITE_OTHER): Payer: PRIVATE HEALTH INSURANCE | Admitting: Family Medicine

## 2013-11-18 VITALS — BP 154/95 | HR 73 | Temp 97.9°F | Wt 325.0 lb

## 2013-11-18 DIAGNOSIS — J029 Acute pharyngitis, unspecified: Secondary | ICD-10-CM

## 2013-11-18 DIAGNOSIS — R12 Heartburn: Secondary | ICD-10-CM | POA: Insufficient documentation

## 2013-11-18 DIAGNOSIS — R1310 Dysphagia, unspecified: Secondary | ICD-10-CM | POA: Insufficient documentation

## 2013-11-18 DIAGNOSIS — F17200 Nicotine dependence, unspecified, uncomplicated: Secondary | ICD-10-CM

## 2013-11-18 DIAGNOSIS — I10 Essential (primary) hypertension: Secondary | ICD-10-CM

## 2013-11-18 LAB — POCT RAPID STREP A (OFFICE): RAPID STREP A SCREEN: NEGATIVE

## 2013-11-18 MED ORDER — OMEPRAZOLE 40 MG PO CPDR: 40.0000 mg | DELAYED_RELEASE_CAPSULE | Freq: Every day | ORAL | Status: DC

## 2013-11-18 MED ORDER — FLUTICASONE PROPIONATE 50 MCG/ACT NA SUSP: 2.0000 | Freq: Every day | NASAL | Status: DC

## 2013-11-18 NOTE — Assessment & Plan Note (Addendum)
Likely from post nasal drip.  Restart the atrovent Saline nasal spray Rx given for flonase Start mucinex to help break up mucus on throat Likely component of reflux - stating prilosec.

## 2013-11-18 NOTE — Assessment & Plan Note (Signed)
Hypertensive today. Not taking medications as prescribed

## 2013-11-18 NOTE — Assessment & Plan Note (Signed)
Continues to smoke. Trying to quit

## 2013-11-18 NOTE — Patient Instructions (Signed)
You are doing well overall Please take your lisinopril for your blood pressure Your throat symptoms are likely from post nasal drip and from reflux Please take the prilosec for 14 days then as needed thereafter Please use the atrovent and saline spray during the day Please use the flonase at night Consider also using mucinex to help break up the mucus Please come back in 4 weeks if you are not better.

## 2013-11-18 NOTE — Progress Notes (Signed)
Julian Lucas is a 38 y.o. male who presents to [facility] today for HTN and sore throat  Dysphagia: present for several weeks. Primarily when laying down. Feels like throat is full. Food does not get stuck and no pain on swallowing. Denies any emesis. Denies any soreness. Pt had cold symptoms in February and flu in January. wtill w/ drainage down throat.   HTN: not taking medications due to "laziness" per pt. Denies CP, HA, SOB.   HEart burn: 2x wkly sympotms. Unsure of triggers.   The following portions of the patient's history were reviewed and updated as appropriate: allergies, current medications, past medical history, family and social history, and problem list.  Patient is a smoker.   Past Medical History  Diagnosis Date  . Hypertension   . HERPES GENITALIS 11/21/2009    Qualifier: Diagnosis of  By: Jeannine Kitten MD, Rodman Key    . PULMONARY EMBOLISM, HX OF 11/21/2009    Qualifier: Diagnosis of  By: Tye Savoy MD, Tommi Rumps    . UNEQUAL LEG LENGTH 08/09/2010    Qualifier: Diagnosis of  By: Ernestina Patches MD, Remo Lipps    . DEGENERATIVE JOINT DISEASE, LEFT KNEE 09/09/2010    Qualifier: Diagnosis of  By: FIELDS MD, KARL      ROS as above otherwise neg.    Medications reviewed. Current Outpatient Prescriptions  Medication Sig Dispense Refill  . fluticasone (FLONASE) 50 MCG/ACT nasal spray Place 2 sprays into both nostrils daily.  16 g  6  . gabapentin (NEURONTIN) 300 MG capsule Take 1 capsule (300 mg total) by mouth 2 (two) times daily.  180 capsule  6  . guaifenesin (ROBITUSSIN) 100 MG/5ML syrup Take 200 mg by mouth 3 (three) times daily as needed for cough.      Marland Kitchen lisinopril (PRINIVIL,ZESTRIL) 5 MG tablet Take 1 tablet (5 mg total) by mouth daily.  90 tablet  3  . omeprazole (PRILOSEC) 40 MG capsule Take 1 capsule (40 mg total) by mouth daily.  30 capsule  3  . traMADol (ULTRAM) 50 MG tablet Take 2 tablets (100 mg total) by mouth every 12 (twelve) hours as needed.  120 tablet  2   No current facility-administered  medications for this visit.    Exam:  BP 154/95  Pulse 73  Temp(Src) 97.9 F (36.6 C) (Oral)  Wt 325 lb (147.419 kg) Gen: Well NAD HEENT: EOMI,  MMM, throat w/o addenopathy. Tonsils absent. AIrway patent. Nasal congestion  Lungs: CTABL Nl WOB Heart: RRR no MRG   No results found for this or any previous visit (from the past 72 hour(s)).  A/P (as seen in Problem list)  HYPERTENSION, BENIGN ESSENTIAL Hypertensive today. Not taking medications as prescribed  TOBACCO ABUSE Continues to smoke. Trying to quit  Heart burn Start prilosec for 14 days then PRN thereafter Pt to try to id triggers Likely component of neck fullness  Dysphagia, unspecified(787.20) Likely from post nasal drip.  Restart the atrovent Saline nasal spray Rx given for flonase Start mucinex to help break up mucus on throat Likely component of reflux - stating prilosec.

## 2013-11-18 NOTE — Assessment & Plan Note (Signed)
Start prilosec for 14 days then PRN thereafter Pt to try to id triggers Likely component of neck fullness

## 2013-11-18 NOTE — Addendum Note (Signed)
Addended by: [place], Jonnathan Birman on: 11/18/2013 12:24 PM   Modules accepted: Orders

## 2013-11-19 LAB — STREP A DNA PROBE: GASP: NEGATIVE

## 2014-06-12 ENCOUNTER — Other Ambulatory Visit: Payer: Self-pay | Admitting: Family Medicine

## 2014-06-12 NOTE — Telephone Encounter (Signed)
Pt called and would like refills on his Diclofenac and Tramadol. Please call when ready. He also wanted the doctor to know that his insurance doesn't go start again until November of 2015. He will make an appointment at that time. jw

## 2014-06-14 MED ORDER — TRAMADOL HCL 50 MG PO TABS: 100.0000 mg | ORAL_TABLET | Freq: Two times a day (BID) | ORAL | Status: DC | PRN

## 2014-06-14 NOTE — Telephone Encounter (Signed)
Tramadol script waiting for patient at front office. According to our records patient is no longer on Diclofenac. I do not feel comfortable writing for this w/o seeing the patient first.   Please attempt to have patient make appointment at this time to ensure space on my schedule.  Thank you!

## 2014-06-15 NOTE — Telephone Encounter (Signed)
Patient informed, will call back to schedule an appointment. 

## 2014-08-01 ENCOUNTER — Encounter: Payer: Self-pay | Admitting: Family Medicine

## 2014-08-01 ENCOUNTER — Ambulatory Visit (INDEPENDENT_AMBULATORY_CARE_PROVIDER_SITE_OTHER): Payer: PRIVATE HEALTH INSURANCE | Admitting: Family Medicine

## 2014-08-01 VITALS — BP 124/85 | HR 67 | Temp 98.1°F | Ht 73.0 in | Wt 318.0 lb

## 2014-08-01 DIAGNOSIS — G8929 Other chronic pain: Secondary | ICD-10-CM

## 2014-08-01 DIAGNOSIS — M2550 Pain in unspecified joint: Secondary | ICD-10-CM

## 2014-08-01 MED ORDER — MELOXICAM 15 MG PO TABS: 15.0000 mg | ORAL_TABLET | Freq: Every day | ORAL | Status: DC

## 2014-08-01 MED ORDER — CYCLOBENZAPRINE HCL 5 MG PO TABS: 5.0000 mg | ORAL_TABLET | Freq: Three times a day (TID) | ORAL | Status: DC | PRN

## 2014-08-01 NOTE — Assessment & Plan Note (Signed)
Patient is complaining of back pain localized to his iliosacral joints and lumbar spine. Pain has been increasing over the past 10 days. Pain is worse in the a.m. Pain radiates up to her shoulders according to the patient. No weakness is present. No bowel or bladder issues. Patient does also complain about numbness over the L4 distribution of his left leg. He states that this has been present for quite some time. It is not changed over the past 7 days.  I feel strongly that patient's chronic pain of multiple joints is likely secondary to his untreated leg length discrepancy. Patient was not interested in attempting to help correct this discrepancy with shoe inserts. I may try to bring this matter up again in the future.   - Short course of meloxicam for acute inflammatory process - Short course of Flexeril to help with any muscle spasms secondary to this inflammatory process.  I've asked patient to follow-up with me within the next 2 weeks if symptoms do not improve or worsen during that timeframe.

## 2014-08-01 NOTE — Progress Notes (Signed)
   HPI  Patient presents today for meeting his new primary care provider and acute onset low back pain. Patient states that his back pain began approximately 7 days ago. Is localized to his lower lumbar spine and iliosacral joint. Patient reports that his pain occasionally radiates up to his shoulders. It is worse in the morning. He states that he has used topical agents like BenGay to help with this pain as well as his prescribed tramadol. He states that neither of these medications have helped much. He denies any weakness in his extremities. He does report numbness over the L4 distribution of his left leg. This has been present for quite some time and has not progressed over the past 7 days. However it is bothersome.  Patient does have a history significant for leg length discrepancy which he has not been treating. He states that he was once given wedges for her shoes which may help correct this discrepancy however he does not like using them and has not used them for quite some time.  Denies any fevers chills dizziness lightheadedness headaches weakness nausea vomiting diarrhea urinary or bowel incontinence.  Smoking status noted ROS: Per HPI  Objective: BP 124/85 mmHg  Pulse 67  Temp(Src) 98.1 F (36.7 C) (Oral)  Ht 6\' 1"  (1.854 m)  Wt 318 lb (144.244 kg)  BMI 41.96 kg/m2 Gen: NAD, alert, cooperative with exam HEENT: NCAT, EOMI, PERRL CV: RRR, good S1/S2, no murmur Resp: CTABL, no wheezes, non-labored Abd: SNTND, BS present, no guarding or organomegaly Ext: No edema, warm, strength 5/5 in all 4 extremities. Pulses present and equal bilaterally. Neuro: Alert and oriented, "numbness" over the L4 dermatomal distribution. Otherwise no focal defects appreciated.  Assessment and plan:  Chronic pain of multiple joints Patient is complaining of back pain localized to his iliosacral joints and lumbar spine. Pain has been increasing over the past 10 days. Pain is worse in the a.m. Pain  radiates up to her shoulders according to the patient. No weakness is present. No bowel or bladder issues. Patient does also complain about numbness over the L4 distribution of his left leg. He states that this has been present for quite some time. It is not changed over the past 7 days.  I feel strongly that patient's chronic pain of multiple joints is likely secondary to his untreated leg length discrepancy. Patient was not interested in attempting to help correct this discrepancy with shoe inserts. I may try to bring this matter up again in the future.   - Short course of meloxicam for acute inflammatory process - Short course of Flexeril to help with any muscle spasms secondary to this inflammatory process.  I've asked patient to follow-up with me within the next 2 weeks if symptoms do not improve or worsen during that timeframe.    No orders of the defined types were placed in this encounter.    Meds ordered this encounter  Medications  . cyclobenzaprine (FLEXERIL) 5 MG tablet    Sig: Take 1 tablet (5 mg total) by mouth 3 (three) times daily as needed for muscle spasms.    Dispense:  15 tablet    Refill:  0  . meloxicam (MOBIC) 15 MG tablet    Sig: Take 1 tablet (15 mg total) by mouth daily.    Dispense:  21 tablet    Refill:  0     Elberta Leatherwood, MD,MS,  PGY1 08/01/2014 6:00 PM

## 2014-08-01 NOTE — Patient Instructions (Signed)
It was a pleasure seeing you today in our clinic. Today we discussed your back pain. Here is the treatment plan we have discussed and agreed upon together:   - I have provided a prescription for a muscle relaxant, Flexeril, as well as an anti-inflammatory, meloxicam. You're to take these as needed. - I would like to have you follow-up to see me in 4 weeks if this pain persists.

## 2014-09-22 ENCOUNTER — Ambulatory Visit: Payer: PRIVATE HEALTH INSURANCE | Admitting: Family Medicine

## 2014-10-19 ENCOUNTER — Telehealth: Payer: Self-pay | Admitting: Family Medicine

## 2014-10-19 ENCOUNTER — Other Ambulatory Visit: Payer: Self-pay | Admitting: Family Medicine

## 2014-10-19 MED ORDER — LISINOPRIL 5 MG PO TABS: 5.0000 mg | ORAL_TABLET | Freq: Every day | ORAL | Status: DC

## 2014-10-19 NOTE — Telephone Encounter (Signed)
Refill done. Patient did not have this med on his chart. There was no indication of why. I restarted the medication as I see he was fairly hypertensive when no taking this medication. I would like him to come in for a BMP in 1-2 months and BP check if possible.  Thank you!

## 2014-10-19 NOTE — Telephone Encounter (Signed)
Pt called and needs a refill on his Lisinopril. jw

## 2014-10-20 NOTE — Telephone Encounter (Signed)
Patient informed, expressed understanding. 

## 2014-10-30 ENCOUNTER — Emergency Department (HOSPITAL_COMMUNITY)
Admission: EM | Admit: 2014-10-30 | Discharge: 2014-10-30 | Disposition: A | Discharge status: Home / Self Care | Payer: PRIVATE HEALTH INSURANCE | Source: Home / Self Care | Attending: Emergency Medicine | Admitting: Emergency Medicine

## 2014-10-30 ENCOUNTER — Encounter (HOSPITAL_COMMUNITY): Payer: Self-pay | Admitting: Emergency Medicine

## 2014-10-30 DIAGNOSIS — T783XXA Angioneurotic edema, initial encounter: Secondary | ICD-10-CM | POA: Diagnosis not present

## 2014-10-30 MED ORDER — MUPIROCIN 2 % EX OINT: 1.0000 "application " | TOPICAL_OINTMENT | Freq: Three times a day (TID) | CUTANEOUS | Status: DC

## 2014-10-30 MED ORDER — LOSARTAN POTASSIUM 50 MG PO TABS: 50.0000 mg | ORAL_TABLET | Freq: Every day | ORAL | Status: DC

## 2014-10-30 NOTE — ED Notes (Signed)
See provider's note

## 2014-10-30 NOTE — Discharge Instructions (Signed)
Stop Lisinopril.  If you have any difficulty breathing, swelling of throat or tongue, return here for recheck.  Return to you Primary Care Physician in 2 weeks for blood pressure recheck.

## 2014-10-30 NOTE — ED Provider Notes (Signed)
   Chief Complaint   Oral Swelling   History of Present Illness   Julian Lucas is a A 39 year old male who has a 2 day history of painless swelling of the right upper lip. He denies any trauma to the area. There is a small pimple on the upper lip. He denies any intraoral lesions. There is no itching, swelling of the tongue or the throat or difficulty breathing. He's been on lisinopril for about 2 years.  Review of Systems   Other than as noted above, the patient denies any of the following symptoms: Systemic:  No fevers or chills. Eye:  No redness, pain, discharge, itching, blurred vision, or diplopia. ENT:  No headache, nasal congestion, sneezing, itching, epistaxis, ear pain, decreased hearing, ringing in ears, vertigo, or tinnitus.  No oral lesions, sore throat, or hoarseness. Neck:  No neck pain or adenopathy. Skin:  No rash or itching.  [place]   Past medical history, family history, social history, meds, and allergies were reviewed. He has hypertension, and degenerative joint disease. Current meds include Flonase nasal spray, Neurontin, Mobic, Prilosec, and tramadol.  Physical Examination     Vital signs:  BP 125/80 mmHg  Pulse 92  Temp(Src) 98.7 F (37.1 C) (Oral)  Resp 16  SpO2 97% General:  Alert and oriented.  In no distress.   Eye:  PERRL, full EOMs, lids and conjunctiva normal.   ENT:  TMs and canals clear.  Nasal mucosa not congested and without drainage.  Mucous membranes moist, no oral lesions, normal dentition, pharynx clear.  No cranial or facial pain to palplation. There is no pain or swelling over the mastoid. There is moderate swelling of the right upper lip which is nontender to palpation. No intraoral lesions. He has a tiny pimple on his right upper lip. There is no induration or tenderness to palpation. No intraoral swelling, pharynx is clear. Neck:  Supple, full ROM.  No adenopathy, tenderness or mass.  Thyroid normal. Skin:  Clear, warm and dry.  Assessment     The encounter diagnosis was Angioedema of lips, initial encounter.  Due to the use of lisinopril. He was told to stop the lisinopril. Doesn't need any other treatment for right now. He was given Bactroban ointment to apply to the small bump on his upper lip area. Will switch to Cozaar for blood pressure control.  Plan    1.  Meds:  The following meds were prescribed:   Discharge Medication List as of 10/30/2014  5:14 PM    START taking these medications   Details  losartan (COZAAR) 50 MG tablet Take 1 tablet (50 mg total) by mouth daily., Starting 10/30/2014, Until Discontinued, Normal    mupirocin ointment (BACTROBAN) 2 % Apply 1 application topically 3 (three) times daily., Starting 10/30/2014, Until Discontinued, Normal        2.  Patient Education/Counseling:  The patient was given appropriate handouts, self care instructions, and instructed in symptomatic relief.  Patient was told not to take any further lisinopril or other ACE inhibitors.  3.  Follow up:  The patient was told to follow up here if no better in 3 to 4 days, or sooner if becoming worse in any way, and given some red flag symptoms such as increasing swelling or difficulty breathing. which would prompt immediate return.       Harden Mo, MD 10/30/14 [phone]

## 2014-12-25 ENCOUNTER — Other Ambulatory Visit: Payer: Self-pay | Admitting: *Deleted

## 2014-12-26 NOTE — Telephone Encounter (Signed)
Per protocol Patient needs to be seen in the office for any refills on this medication. Please remind patient of this detail. I apologize for the inconvenience.

## 2014-12-29 NOTE — Telephone Encounter (Signed)
Called patient and informed that he would need office visit to discuss Tramadol refill.  Appt scheduled for 01/24/15 at 3:30 pm.  Will route note to Dr. Alease Frame for possible refill since first available appt not until June.  Will check and call patient back.  Burna Forts, BSN, RN-BC

## 2015-01-24 ENCOUNTER — Ambulatory Visit (INDEPENDENT_AMBULATORY_CARE_PROVIDER_SITE_OTHER): Payer: No Typology Code available for payment source | Admitting: Family Medicine

## 2015-01-24 ENCOUNTER — Encounter: Payer: Self-pay | Admitting: Family Medicine

## 2015-01-24 VITALS — BP 121/75 | HR 67 | Temp 98.1°F | Ht 73.0 in | Wt 324.0 lb

## 2015-01-24 DIAGNOSIS — G8929 Other chronic pain: Secondary | ICD-10-CM

## 2015-01-24 DIAGNOSIS — M2550 Pain in unspecified joint: Secondary | ICD-10-CM

## 2015-01-24 MED ORDER — MELOXICAM 15 MG PO TABS: 15.0000 mg | ORAL_TABLET | Freq: Every day | ORAL | Status: DC

## 2015-01-24 MED ORDER — TRAMADOL HCL 50 MG PO TABS: 100.0000 mg | ORAL_TABLET | Freq: Two times a day (BID) | ORAL | Status: DC | PRN

## 2015-01-24 NOTE — Patient Instructions (Addendum)
It was a pleasure seeing you today in our clinic. Today we discussed your knee and back pain. Here is the treatment plan we have discussed and agreed upon together:   - I have refilled your tramadol and Mobic. Please continue to take these medications appropriately. - Your blood pressure looks great today! Keep up the great work.

## 2015-01-29 NOTE — Assessment & Plan Note (Addendum)
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 2/2 injury and a significant leg-length discrepancy. Has had great improvement w/ Mobic. Otherwise no new changes.  - Refilled Ultram - Refilled Mobic - advised to take w/ food and to avoid regular use or other NSAIDs - Will eventually need joint replacement   Next: joint injections may be a good option for this patient. It looks like he has had these in the past, maybe had 1-73mths of relief, but has not had recently.

## 2015-01-29 NOTE — Progress Notes (Signed)
   HPI  CC: medication refill  # leg-length discrepancy: Patient is here for a medication refill. Patient takes Ultram and Mobic for his knee pain. He states that pain is fairly well controlled on his current medication regimen. Some days are better/worse than others. Pain is bilateral but L>R. Worse w/ long days on his feet. Achy in nature. Unchanged from previous visits. Patient understands that the endpoint for his current arthritis (severe) is going to be joint replacement. We discussed the fact that many surgeons would ask that he hold out as long as he can before having this done due to the limitations on artificial joint longevity.   Patient reports that the Ultram and Mobic help very much. He takes the Ultram for any initial pain (usually daily). The Mobic he takes only on the truly "bad days". The original order by this provider seems to have been written for 21 tabs ~52months ago.   ROS: patient denies recent injury, erythema, edema, drainage, ecchymosis, fever, chills, diaphoresis, other joint pain, n/v/d, abdominal pain, appetite changes  Review of Systems   See HPI for ROS. All other systems reviewed and are negative.  Past medical history and social history reviewed and updated in the EMR as appropriate.  Objective: BP 121/75 mmHg  Pulse 67  Temp(Src) 98.1 F (36.7 C) (Oral)  Ht 6\' 1"  (1.854 m)  Wt 324 lb (146.965 kg)  BMI 42.76 kg/m2 Gen: NAD, alert, cooperative with exam HEENT: NCAT, EOMI, PERRL CV: RRR, no murmur Resp: CTABL, no wheezes, non-labored Abd: SNTND, BS present, no guarding or organomegaly Ext: No edema, warm, strength 5/5 in all 4 extremities. Joint line tenderness present at bilateral knees. Pulses present and equal bilaterally. Neuro: Alert and oriented, minimal numbness over the L4 dermatomal distribution. Otherwise no focal defects appreciated.   Assessment and plan:  Chronic pain of multiple joints Patient has severe bilateral joint degeneration  of his knees. 2/2 injury and a significant leg-length discrepancy. Has had great improvement w/ Mobic. Otherwise no new changes.  - Refilled Ultram - Refilled Mobic - advised to take w/ food and to avoid regular use or other NSAIDs - Will eventually need joint replacement   Next: joint injections may be a good option for this patient. It looks like he has had these in the past, maybe had 1-47mths of relief, but has not had recently.     Meds ordered this encounter  Medications  . meloxicam (MOBIC) 15 MG tablet    Sig: Take 1 tablet (15 mg total) by mouth daily.    Dispense:  30 tablet    Refill:  1  . traMADol (ULTRAM) 50 MG tablet    Sig: Take 2 tablets (100 mg total) by mouth every 12 (twelve) hours as needed.    Dispense:  120 tablet    Refill:  2     Elberta Leatherwood, MD,MS,  PGY1 01/29/2015 8:17 PM

## 2015-07-11 ENCOUNTER — Other Ambulatory Visit: Payer: Self-pay | Admitting: *Deleted

## 2015-07-11 MED ORDER — GABAPENTIN 300 MG PO CAPS: 300.0000 mg | ORAL_CAPSULE | Freq: Two times a day (BID) | ORAL | Status: DC

## 2015-07-27 ENCOUNTER — Other Ambulatory Visit: Payer: Self-pay | Admitting: Family Medicine

## 2015-08-03 ENCOUNTER — Telehealth: Payer: Self-pay | Admitting: *Deleted

## 2015-08-03 NOTE — Telephone Encounter (Signed)
-----   Message from Elberta Leatherwood, MD sent at 08/02/2015  7:51 PM EST ----- Script is waiting up front

## 2015-08-03 NOTE — Telephone Encounter (Signed)
LM for patient that script is ready for pick up. Danel Requena,CMA  

## 2015-08-03 NOTE — Telephone Encounter (Signed)
Left message on voicemail informing that rx was ready to be picked up. 

## 2015-08-06 ENCOUNTER — Telehealth: Payer: Self-pay | Admitting: Family Medicine

## 2015-08-06 NOTE — Telephone Encounter (Signed)
Patient came to pick up prescriptions and found a prescription missing. Patient asks refill for Gabapentin 300 mg.  Please, follow up with Patient.

## 2015-08-10 NOTE — Telephone Encounter (Signed)
That script was sent to his pharm

## 2015-10-02 ENCOUNTER — Encounter: Payer: Self-pay | Admitting: Family Medicine

## 2015-10-02 ENCOUNTER — Ambulatory Visit (INDEPENDENT_AMBULATORY_CARE_PROVIDER_SITE_OTHER): Payer: BLUE CROSS/BLUE SHIELD | Admitting: Family Medicine

## 2015-10-02 VITALS — BP 147/99 | HR 77 | Temp 98.2°F | Wt 326.0 lb

## 2015-10-02 DIAGNOSIS — A6000 Herpesviral infection of urogenital system, unspecified: Secondary | ICD-10-CM

## 2015-10-02 DIAGNOSIS — I10 Essential (primary) hypertension: Secondary | ICD-10-CM

## 2015-10-02 DIAGNOSIS — M2170 Unequal limb length (acquired), unspecified site: Secondary | ICD-10-CM | POA: Diagnosis not present

## 2015-10-02 DIAGNOSIS — R12 Heartburn: Secondary | ICD-10-CM | POA: Diagnosis not present

## 2015-10-02 MED ORDER — LOSARTAN POTASSIUM 50 MG PO TABS: 50.0000 mg | ORAL_TABLET | Freq: Every day | ORAL | Status: DC

## 2015-10-02 MED ORDER — OMEPRAZOLE 40 MG PO CPDR: 40.0000 mg | DELAYED_RELEASE_CAPSULE | Freq: Every day | ORAL | Status: DC

## 2015-10-02 MED ORDER — TRAMADOL HCL 50 MG PO TABS: ORAL_TABLET | ORAL | Status: DC

## 2015-10-02 MED ORDER — GABAPENTIN 300 MG PO CAPS: 300.0000 mg | ORAL_CAPSULE | Freq: Two times a day (BID) | ORAL | Status: DC

## 2015-10-02 NOTE — Assessment & Plan Note (Signed)
Worsening: Patient is here with worsening hypertension. He reports that he has been out of his antihypertensive medications for over a month. He states that he had asked [facility] to send me a request for refills. To my knowledge this was never sent to me. However looking at patients chart it appears as though this request may have been sent to a different provider. - I've refilled his Cozaar. - I've asked that he follow-up in our office for a nursing blood pressure check in 4 weeks.

## 2015-10-02 NOTE — Assessment & Plan Note (Signed)
Stable: Patient has a history of a leg length discrepancy with the left being shorter than the right. Patient is currently not using any lift to the left side. I believe that the majority of his lower extremity pain that he experiences on a daily basis is likely secondary to a combination of his leg length discrepancy and his obesity. Patient is taking 1 tablet of gabapentin and 1 tablet of Ultram prior to work. He states that this regimen will typically last for an entire 10 hour shift. He states that he was previously seen at the [facility] and provided a very expensive brace for his left knee, but was never provided any shoe lift. Patient is agreeable to be seen again at the sports medicine clinic for this issue. - Referral is been placed to sports medicine for assessment of his leg length discrepancy.  - Patient had asked multiple times if a relatively inexpensive treatment plan could be discussed for this issue as he does not feel as though he had any benefit from the knee brace he had been previously provided. I've asked that he bring this up to the sports medicine team, as they would likely be able to work with him as they see fit. - Refilled tramadol and gabapentin.

## 2015-10-02 NOTE — Patient Instructions (Signed)
It was a pleasure seeing you today in our clinic. Today we discussed your medications and knee pain. Here is the treatment plan we have discussed and agreed upon together:   - I placed a referral to sports medicine. I will make sure to place in my notes that I would like them to address her leg length discrepancy. - I've refilled her medications. - I've reordered your antihypertensive medication. Please make sure to take this regularly. - I would like to see you back in one month to have your blood pressure rechecked by our nursing staff.

## 2015-10-02 NOTE — Progress Notes (Signed)
HPI  CC: Medication refill. Patient is here for medication refill. He states that his knee and leg pain has been well-controlled with his current medication regimen. He has no significant complaints at this time. We discussed his leg length discrepancy at length. He states that he still does not have any shoe lift to compensate for this discrepancy. Patient stated that he would be interested in a referral to sports medicine to help obtain a proper left.  Hypertension Patient states that he has been out of his antihypertensive medications for quite some time. He is asking for refills today.  Genital herpes Patient reports that he has not had an outbreak in many years. He is asking today whether or not there is a possibility that he could have cleared this infection. Patient reports that he had previously needed to be on Valtrex but has not needed this for over 2 years.   ROS: Patient denies any recent headaches, dizziness, lightheadedness, vision changes, shortness of breath, chest pain, abdominal pain, vomiting, diarrhea, or constipation. Patient endorses some occasional GERD-like symptoms with heartburn and sourness in the back of his throat when he lays down.   Past medical history and social history reviewed and updated in the EMR as appropriate.  Objective: BP 147/99 mmHg  Pulse 77  Temp(Src) 98.2 F (36.8 C) (Oral)  Wt 326 lb (147.873 kg) Gen: NAD, alert, cooperative, and pleasant. CV: RRR, no murmur Resp: CTAB, no wheezes, non-labored Abd: SNTND, BS present, no guarding or organomegaly Ext: No edema, warm, left leg shorter than the right (~2cm)   Assessment and plan:  HYPERTENSION, BENIGN ESSENTIAL Worsening: Patient is here with worsening hypertension. He reports that he has been out of his antihypertensive medications for over a month. He states that he had asked [facility] to send me a request for refills. To my knowledge this was never sent to me. However looking at  patients chart it appears as though this request may have been sent to a different provider. - I've refilled his Cozaar. - I've asked that he follow-up in our office for a nursing blood pressure check in 4 weeks.  Genital herpes Stable: Patient has not had an outbreak in over 2 years. He states he has not been on any Valtrex for the same amount of time. He is asking today whether or not there is a chance his body could've cleared this infection and he is no longer at risk for an outbreak. I informed him that this is highly unlikely, and to the best of my knowledge I do not believe the body can clear this infection after he has had a history of previous outbreaks. - I have asked that he closely monitor for any signs or symptoms of infection. At that time I will promptly prescribe the appropriate coverage. - I reminded him the importance of barrier contraception for both patient and his partner.  UNEQUAL LEG LENGTH Stable: Patient has a history of a leg length discrepancy with the left being shorter than the right. Patient is currently not using any lift to the left side. I believe that the majority of his lower extremity pain that he experiences on a daily basis is likely secondary to a combination of his leg length discrepancy and his obesity. Patient is taking 1 tablet of gabapentin and 1 tablet of Ultram prior to work. He states that this regimen will typically last for an entire 10 hour shift. He states that he was previously seen at the [facility] and provided a very expensive brace for his left knee, but was never provided any shoe lift. Patient is agreeable to be seen again at the sports medicine clinic for this issue. - Referral is been placed to sports medicine for assessment of his leg length discrepancy.  - Patient had asked multiple times if a relatively inexpensive treatment plan could be discussed for this issue as he does not feel as though he had any benefit from the knee  brace he had been previously provided. I've asked that he bring this up to the sports medicine team, as they would likely be able to work with him as they see fit. - Refilled tramadol and gabapentin.    Orders Placed This Encounter  Procedures  . Ambulatory referral to Sports Medicine    Referral Priority:  Routine    Referral Type:  Consultation    Number of Visits Requested:  1    Meds ordered this encounter  Medications  . gabapentin (NEURONTIN) 300 MG capsule    Sig: Take 1 capsule (300 mg total) by mouth 2 (two) times daily.    Dispense:  180 capsule    Refill:  3  . omeprazole (PRILOSEC) 40 MG capsule    Sig: Take 1 capsule (40 mg total) by mouth daily.    Dispense:  30 capsule    Refill:  5  . traMADol (ULTRAM) 50 MG tablet    Sig: TAKE TWO TABLETS BY MOUTH EVERY 12 HOURS AS NEEDED    Dispense:  120 tablet    Refill:  0  . losartan (COZAAR) 50 MG tablet    Sig: Take 1 tablet (50 mg total) by mouth daily.    Dispense:  90 tablet    Refill:  3     Elberta Leatherwood, MD,MS,  PGY2 10/02/2015 6:43 PM

## 2015-10-02 NOTE — Assessment & Plan Note (Signed)
Stable: Patient has not had an outbreak in over 2 years. He states he has not been on any Valtrex for the same amount of time. He is asking today whether or not there is a chance his body could've cleared this infection and he is no longer at risk for an outbreak. I informed him that this is highly unlikely, and to the best of my knowledge I do not believe the body can clear this infection after he has had a history of previous outbreaks. - I have asked that he closely monitor for any signs or symptoms of infection. At that time I will promptly prescribe the appropriate coverage. - I reminded him the importance of barrier contraception for both patient and his partner.

## 2015-10-23 ENCOUNTER — Ambulatory Visit: Payer: BLUE CROSS/BLUE SHIELD | Admitting: Family Medicine

## 2015-10-30 ENCOUNTER — Encounter: Payer: Self-pay | Admitting: Family Medicine

## 2015-10-30 ENCOUNTER — Ambulatory Visit (INDEPENDENT_AMBULATORY_CARE_PROVIDER_SITE_OTHER): Payer: BLUE CROSS/BLUE SHIELD | Admitting: Family Medicine

## 2015-10-30 VITALS — BP 128/88

## 2015-10-30 DIAGNOSIS — M25562 Pain in left knee: Secondary | ICD-10-CM | POA: Diagnosis not present

## 2015-10-30 DIAGNOSIS — G8929 Other chronic pain: Secondary | ICD-10-CM

## 2015-11-05 NOTE — Progress Notes (Signed)
  Julian Lucas - 40 y.o. male MRN [id number]  Date of birth: May 23, 1976  CC: Left knee pain  SUBJECTIVE:   HPI  Julian Lucas is a pleasant 40 yo male with chronic left knee pain who presents for re-evaluation. He was last seen in 2014. He has known advanced DJD of his knee following a remote injury as a child requiring surgical pinning and full leg cast.  He was seen by ortho in the past who mentioned to him that he is too young to have a joint replacement. He takes tramadol, but this doesn't help much. Referral here today to discuss his leg length discrepancy.  He has never had heel lifts. Injections have stopped helping his knee pain. He is being referred here today to be evaluated for his leg length discrepancy.  He is taking gabapentin as well.   ROS:     As above, no fevers, chills night sweats. No recent joint swelling or new rashes.  HISTORY: Past Medical, Surgical, Social, and Family History Reviewed & Updated per EMR.  Pertinent Historical Findings include:hypertension, depression,leg length discrepancy following remote knee surgery. Current every day smoker. obesity  Data reviewed: Left knee x-rays from 06/17/2013 s arthritic change in the left knee.   OBJECTIVE: BP 128/88 mmHg  Physical Exam  Calm, no acute distress Non labored breathing  Hip: left ROM IR: 80 Deg, ER: 80 DegIR: 5/5, ER: 5/5, Flexion: 5/5, Extension: 5/5, Abduction: 5/5, Adduction: 5/5 Leg length on left of 98 cm versus leg length on right and 102 cm  Knee: left Significant valgus deformity of the left leg 1+ effusion Significant medial and lateral joinline tenderness as well as fibula head  Limited range of motion from 5-120 Ligaments with solid consistent endpoints including ACL, PCL, LCL, MCL. Obvious limp   MEDICATIONS, LABS & OTHER ORDERS: Previous Medications   FLUTICASONE (FLONASE) 50 MCG/ACT NASAL SPRAY    Place 2 sprays into both nostrils daily.   GABAPENTIN (NEURONTIN) 300 MG CAPSULE    Take 1 capsule  (300 mg total) by mouth 2 (two) times daily.   LOSARTAN (COZAAR) 50 MG TABLET    Take 1 tablet (50 mg total) by mouth daily.   MUPIROCIN OINTMENT (BACTROBAN) 2 %    Apply 1 application topically 3 (three) times daily.   OMEPRAZOLE (PRILOSEC) 40 MG CAPSULE    Take 1 capsule (40 mg total) by mouth daily.   TRAMADOL (ULTRAM) 50 MG TABLET    TAKE TWO TABLETS BY MOUTH EVERY 12 HOURS AS NEEDED   Modified Medications   No medications on file   New Prescriptions   No medications on file   Discontinued Medications   No medications on file  No orders of the defined types were placed in this encounter.   ASSESSMENT & PLAN: Advanced OA of the left knee Following remote knee trauma with4 cmleg length deficiency on the left side: We will discuss the possibility of a knee replacement with Dr. Mayer Camel and call Mr. Kimber back. He knows that he needs to lose weight to have a knee replacement.In regards to his leg length discrepancy we did provide him a heel lift. He has a 4 cmleg length discrepancy. He will try a 5/16 heel lift. In 2 weeks we will see him back and consider adding additional lift. Call with any questions.

## 2015-11-06 ENCOUNTER — Encounter: Payer: Self-pay | Admitting: *Deleted

## 2015-11-06 ENCOUNTER — Telehealth: Payer: Self-pay | Admitting: Family Medicine

## 2015-11-06 NOTE — Telephone Encounter (Signed)
Spoke with patient regarding his knee pain and leg length discrepancy.  We will refer him to ortho to discuss a TKA.  He knows he will need to lose some weight for this to be a possibility.  He will also make a follow up to have his heel lift adjusted and discuss orthotics vs. referral to biotech to discuss custom shoes.

## 2015-11-06 NOTE — Patient Instructions (Signed)
[facility] in [place], [place], [place], [place] [address] Dr Frederik Pear Thursday 11/15/15 at 445pm Arrival time is 415p Phone: [phone]

## 2015-11-08 ENCOUNTER — Emergency Department (HOSPITAL_COMMUNITY): Payer: BLUE CROSS/BLUE SHIELD

## 2015-11-08 ENCOUNTER — Encounter (HOSPITAL_COMMUNITY): Payer: Self-pay | Admitting: Emergency Medicine

## 2015-11-08 ENCOUNTER — Emergency Department (HOSPITAL_COMMUNITY)
Admission: EM | Admit: 2015-11-08 | Discharge: 2015-11-08 | Disposition: A | Discharge status: Home / Self Care | Payer: BLUE CROSS/BLUE SHIELD | Attending: Emergency Medicine | Admitting: Emergency Medicine

## 2015-11-08 DIAGNOSIS — Z79899 Other long term (current) drug therapy: Secondary | ICD-10-CM | POA: Insufficient documentation

## 2015-11-08 DIAGNOSIS — I10 Essential (primary) hypertension: Secondary | ICD-10-CM | POA: Diagnosis not present

## 2015-11-08 DIAGNOSIS — F17210 Nicotine dependence, cigarettes, uncomplicated: Secondary | ICD-10-CM | POA: Diagnosis not present

## 2015-11-08 DIAGNOSIS — Z86711 Personal history of pulmonary embolism: Secondary | ICD-10-CM | POA: Insufficient documentation

## 2015-11-08 DIAGNOSIS — M2170 Unequal limb length (acquired), unspecified site: Secondary | ICD-10-CM | POA: Diagnosis not present

## 2015-11-08 DIAGNOSIS — Z8619 Personal history of other infectious and parasitic diseases: Secondary | ICD-10-CM | POA: Diagnosis not present

## 2015-11-08 DIAGNOSIS — Z792 Long term (current) use of antibiotics: Secondary | ICD-10-CM | POA: Diagnosis not present

## 2015-11-08 DIAGNOSIS — R531 Weakness: Secondary | ICD-10-CM | POA: Diagnosis not present

## 2015-11-08 DIAGNOSIS — M25571 Pain in right ankle and joints of right foot: Secondary | ICD-10-CM | POA: Insufficient documentation

## 2015-11-08 DIAGNOSIS — Z7951 Long term (current) use of inhaled steroids: Secondary | ICD-10-CM | POA: Insufficient documentation

## 2015-11-08 NOTE — ED Notes (Signed)
Pt reports R ankle has been giving out on him since he woke up yesterday morning. Denies injury.

## 2015-11-08 NOTE — ED Provider Notes (Signed)
CSN: [id number]     Arrival date & time 11/08/15  [id number] History  By signing my name below, I, Rayna Sexton, attest that this documentation has been prepared under the direction and in the presence of Everlene Balls, MD. Electronically Signed: Rayna Sexton, ED Scribe. 11/08/2015. 3:27 AM.    Chief Complaint  Patient presents with  . Ankle Pain   The history is provided by the patient. No language interpreter was used.   HPI Comments: Julian Lucas is a 40 y.o. male who presents to the Emergency Department complaining of intermittent right ankle weakness onset this morning. Pt reports that when ambulating his ankle will sometimes roll resulting in moderate pain and near falls noting that he catches himself before completely losing balance. His pain worsens with movement. He reports a PMHx including sciatica noting that he takes Aleve regularly for pain management. He denies recent trauma, recent increase in athletic activity or any associated symptoms at this time.   Past Medical History  Diagnosis Date  . Hypertension   . HERPES GENITALIS 11/21/2009    Qualifier: Diagnosis of  By: Jeannine Kitten MD, Rodman Key    . PULMONARY EMBOLISM, HX OF 11/21/2009    Qualifier: Diagnosis of  By: Tye Savoy MD, Tommi Rumps    . UNEQUAL LEG LENGTH 08/09/2010    Qualifier: Diagnosis of  By: Ernestina Patches MD, Remo Lipps    . DEGENERATIVE JOINT DISEASE, LEFT KNEE 09/09/2010    Qualifier: Diagnosis of  By: Oneida Alar MD, KARL     Past Surgical History  Procedure Laterality Date  . Knee surgery    . Neck surgery    . Tonsilectomy, adenoidectomy, bilateral myringotomy and tubes     Family History  Problem Relation Age of Onset  . Lung cancer Father   . Depression Mother   . Hypertension Mother   . Heart disease Mother    Social History  Substance Use Topics  . Smoking status: Current Every Day Smoker -- 0.30 packs/day    Types: Cigarettes  . Smokeless tobacco: None     Comment: 6 cigs a day  . Alcohol Use: Yes    Review of  Systems A complete 10 system review of systems was obtained and all systems are negative except as noted in the HPI and PMH.   Allergies  Review of patient's allergies indicates no known allergies.  Home Medications   Prior to Admission medications   Medication Sig Start Date End Date Taking? Authorizing Provider  fluticasone (FLONASE) 50 MCG/ACT nasal spray Place 2 sprays into both nostrils daily. 11/18/13   Waldemar Dickens, MD  gabapentin (NEURONTIN) 300 MG capsule Take 1 capsule (300 mg total) by mouth 2 (two) times daily. 10/02/15   Elberta Leatherwood, MD  losartan (COZAAR) 50 MG tablet Take 1 tablet (50 mg total) by mouth daily. 10/02/15   Elberta Leatherwood, MD  mupirocin ointment (BACTROBAN) 2 % Apply 1 application topically 3 (three) times daily. 10/30/14   Harden Mo, MD  omeprazole (PRILOSEC) 40 MG capsule Take 1 capsule (40 mg total) by mouth daily. 10/02/15   Elberta Leatherwood, MD  traMADol (ULTRAM) 50 MG tablet TAKE TWO TABLETS BY MOUTH EVERY 12 HOURS AS NEEDED 10/02/15   Elberta Leatherwood, MD   BP 141/93 mmHg  Pulse 68  Temp(Src) 97.9 F (36.6 C) (Oral)  Resp 16  Ht 6\' 2"  (1.88 m)  Wt 341 lb 14.4 oz (155.085 kg)  BMI 43.88 kg/m2  SpO2 95%    Physical Exam  Constitutional: He is oriented to person, place, and time. Vital signs are normal. He appears well-developed and well-nourished.  Non-toxic appearance. He does not appear ill. No distress.  HENT:  Head: Normocephalic and atraumatic.  Nose: Nose normal.  Mouth/Throat: Oropharynx is clear and moist. No oropharyngeal exudate.  Eyes: Conjunctivae and EOM are normal. Pupils are equal, round, and reactive to light. No scleral icterus.  Neck: Normal range of motion. Neck supple. No tracheal deviation, no edema, no erythema and normal range of motion present. No thyroid mass and no thyromegaly present.  Cardiovascular: Normal rate, regular rhythm, S1 normal, S2 normal, normal heart sounds, intact distal pulses and normal pulses.  Exam reveals no  gallop and no friction rub.   No murmur heard. Pulmonary/Chest: Effort normal and breath sounds normal. No respiratory distress. He has no wheezes. He has no rhonchi. He has no rales.  Abdominal: Soft. Normal appearance and bowel sounds are normal. He exhibits no distension, no ascites and no mass. There is no hepatosplenomegaly. There is no tenderness. There is no rebound, no guarding and no CVA tenderness.  Musculoskeletal: Normal range of motion. He exhibits no edema or tenderness.  Right foot: nml ROM; nml strength and sensation; nml pulses; no TTP of right ankle or foot  Lymphadenopathy:    He has no cervical adenopathy.  Neurological: He is alert and oriented to person, place, and time. He has normal strength. No cranial nerve deficit or sensory deficit.  Skin: Skin is warm, dry and intact. No petechiae and no rash noted. He is not diaphoretic. No erythema. No pallor.  Psychiatric: He has a normal mood and affect. His behavior is normal. Judgment normal.  Nursing note and vitals reviewed.   ED Course  Procedures  DIAGNOSTIC STUDIES: Oxygen Saturation is 95% on RA, adequate by my interpretation.    COORDINATION OF CARE: 3:25 AM Discussed next steps with pt and he agreed with the plan.   Labs Review Labs Reviewed - No data to display  Imaging Review Dg Ankle Complete Right  11/08/2015  CLINICAL DATA:  Acute onset of right ankle pain and weakness. Initial encounter. EXAM: RIGHT ANKLE - COMPLETE 3+ VIEW COMPARISON:  None. FINDINGS: There is no evidence of fracture or dislocation. The ankle mortise is intact; the interosseous space is within normal limits. No talar tilt or subluxation is seen. Pes planus is noted. The joint spaces are preserved. No significant soft tissue abnormalities are seen. IMPRESSION: 1. No evidence of fracture or dislocation. 2. Pes planus noted. Electronically Signed   By: Garald Balding M.D.   On: 11/08/2015 03:44   I have personally reviewed and evaluated  these images and lab results as part of my medical decision-making.   EKG Interpretation None      MDM   Final diagnoses:  None    Patient presents emergency department for ankle pain. He denies any trauma. X-ray does not reveal any abnormalities. He was advised on rice techniques. We'll give orthopedic surgery follow-up as well. He is advised on naproxen use at home. He appears well and in no acute distress, vital signs were within his normal limits and he is safe for discharge.   I personally performed the services described in this documentation, which was scribed in my presence. The recorded information has been reviewed and is accurate.     Everlene Balls, MD 11/08/15 0400

## 2015-11-08 NOTE — Discharge Instructions (Signed)
Ankle Pain Mr. Baggarly, take either naproxen or ibuprofen as needed for your ankle pain.Be sure to elevate and ice it throughout the day. You can see orthopedic surgery for follow-up. If any symptoms worsen, come back to emergency department immediately. Thank you. Ankle pain is a common symptom. The bones, cartilage, tendons, and muscles of the ankle joint perform a lot of work each day. The ankle joint holds your body weight and allows you to move around. Ankle pain can occur on either side or back of 1 or both ankles. Ankle pain may be sharp and burning or dull and aching. There may be tenderness, stiffness, redness, or warmth around the ankle. The pain occurs more often when a person walks or puts pressure on the ankle. CAUSES  There are many reasons ankle pain can develop. It is important to work with your caregiver to identify the cause since many conditions can impact the bones, cartilage, muscles, and tendons. Causes for ankle pain include:  Injury, including a break (fracture), sprain, or strain often due to a fall, sports, or a high-impact activity.  Swelling (inflammation) of a tendon (tendonitis).  Achilles tendon rupture.  Ankle instability after repeated sprains and strains.  Poor foot alignment.  Pressure on a nerve (tarsal tunnel syndrome).  Arthritis in the ankle or the lining of the ankle.  Crystal formation in the ankle (gout or pseudogout). DIAGNOSIS  A diagnosis is based on your medical history, your symptoms, results of your physical exam, and results of diagnostic tests. Diagnostic tests may include X-ray exams or a computerized magnetic scan (magnetic resonance imaging, MRI). TREATMENT  Treatment will depend on the cause of your ankle pain and may include:  Keeping pressure off the ankle and limiting activities.  Using crutches or other walking support (a cane or brace).  Using rest, ice, compression, and elevation.  Participating in physical therapy or home  exercises.  Wearing shoe inserts or special shoes.  Losing weight.  Taking medications to reduce pain or swelling or receiving an injection.  Undergoing surgery. HOME CARE INSTRUCTIONS   Only take over-the-counter or prescription medicines for pain, discomfort, or fever as directed by your caregiver.  Put ice on the injured area.  Put ice in a plastic bag.  Place a towel between your skin and the bag.  Leave the ice on for 15-20 minutes at a time, 03-04 times a day.  Keep your leg raised (elevated) when possible to lessen swelling.  Avoid activities that cause ankle pain.  Follow specific exercises as directed by your caregiver.  Record how often you have ankle pain, the location of the pain, and what it feels like. This information may be helpful to you and your caregiver.  Ask your caregiver about returning to work or sports and whether you should drive.  Follow up with your caregiver for further examination, therapy, or testing as directed. SEEK MEDICAL CARE IF:   Pain or swelling continues or worsens beyond 1 week.  You have an oral temperature above 102 F (38.9 C).  You are feeling unwell or have chills.  You are having an increasingly difficult time with walking.  You have loss of sensation or other new symptoms.  You have questions or concerns. MAKE SURE YOU:   Understand these instructions.  Will watch your condition.  Will get help right away if you are not doing well or get worse.   This information is not intended to replace advice given to you by your health care  provider. Make sure you discuss any questions you have with your health care provider.   Document Released: 01/29/2010 Document Revised: 11/03/2011 Document Reviewed: 03/13/2015 Elsevier Interactive Patient Education 2016 [employer] for Routine Care of Injuries Many injuries can be cared for using rest, ice, compression, and elevation (RICE therapy). Using RICE therapy can help  to lessen pain and swelling. It can help your body to heal. Rest Reduce your normal activities and avoid using the injured part of your body. You can go back to your normal activities when you feel okay and your doctor says it is okay. Ice Do not put ice on your bare skin.  Put ice in a plastic bag.  Place a towel between your skin and the bag.  Leave the ice on for 20 minutes, 2-3 times a day. Do this for as long as told by your doctor. Compression Compression means putting pressure on the injured area. This can be done with an elastic bandage. If an elastic bandage has been applied:  Remove and reapply the bandage every 3-4 hours or as told by your doctor.  Make sure the bandage is not wrapped too tight. Wrap the bandage more loosely if part of your body beyond the bandage is blue, swollen, cold, painful, or loses feeling (numb).  See your doctor if the bandage seems to make your problems worse. Elevation Elevation means keeping the injured area raised. Raise the injured area above your heart or the center of your chest if you can. WHEN SHOULD I GET HELP? You should get help if:  You keep having pain and swelling.  Your symptoms get worse. WHEN SHOULD I GET HELP RIGHT AWAY? You should get help right away if:  You have sudden bad pain at or below the area of your injury.  You have redness or more swelling around your injury.  You have tingling or numbness at or below the injury that does not go away when you take off the bandage.   This information is not intended to replace advice given to you by your health care provider. Make sure you discuss any questions you have with your health care provider.   Document Released: 01/28/2008 Document Revised: 05/02/2015 Document Reviewed: 07/19/2014 Elsevier Interactive Patient Education [employer].

## 2015-11-09 ENCOUNTER — Ambulatory Visit (INDEPENDENT_AMBULATORY_CARE_PROVIDER_SITE_OTHER): Payer: BLUE CROSS/BLUE SHIELD | Admitting: Family Medicine

## 2015-11-09 VITALS — BP 137/84 | HR 77 | Ht 74.0 in | Wt 328.0 lb

## 2015-11-09 DIAGNOSIS — M79671 Pain in right foot: Secondary | ICD-10-CM

## 2015-11-09 DIAGNOSIS — M25571 Pain in right ankle and joints of right foot: Secondary | ICD-10-CM

## 2015-11-09 DIAGNOSIS — M25579 Pain in unspecified ankle and joints of unspecified foot: Secondary | ICD-10-CM | POA: Insufficient documentation

## 2015-11-09 NOTE — Assessment & Plan Note (Signed)
Multifactorial issues with acute strain likely from increased standing time. Obesity is a contributor as he is aware. Pes planus is a contributor. We placed him in an ASO with an insert and scaphoid pad. I recommend he get some ankle boots to wear at work. We'll try to offload this over the next 2-3 weeks and see him back. He should bring his work shoes and we'll potentially make him some custom molded orthotics. At that time of he's having some improvement in his pain, I would like to start him on ankle exercise program.

## 2015-11-09 NOTE — Progress Notes (Signed)
Patient ID: Julian Lucas, male   DOB: 10-15-1975, 40 y.o.   MRN: [id number]  Julian Lucas - 40 y.o. male MRN [id number]  Date of birth: Jun 06, 1976    SUBJECTIVE:     Chief Complaint: Right ankle pain for the last or to 5 days. Has not had problem with this before  HPI: Anterior part of the right ankle is aching. Worse with standing, walking, any flexion or extension of the ankle. Notably he has been working extra long hours at his job at [employer]. He's been working 12 hour shifts. He wears tennis shoes or slippers bones at work. Pain is localized to the ankle, does not radiate, is improved with rest, worsened with activity. ROS:     He's had no unusual weight change. He's noted no erythema or swelling of the ankle. He's had no fever.  PERTINENT  PMH / PSH FH / / SH:  Past Medical, Surgical, Social, and Family History Reviewed & Updated in the EMR.  Pertinent findings include:  Left leg is 4 cm shorter than the right. DJD bilateral knees. Hypertension Obesity  OBJECTIVE: BP 137/84 mmHg  Pulse 77  Ht 6\' 2"  (1.88 m)  Wt 328 lb (148.78 kg)  BMI 42.09 kg/m2  Physical Exam:  Vital signs are reviewed. Vital signs are reviewed notably his weight is greater than 3 her pounds with a BMI of 42. GEN.: Well-developed obese male no acute distress FEET: Bilateral pes planus. The right ankle is beginning to shift medially. He's tender to palpation over the right dorsal foot, specifically over the extensor digitorum. He has normal plantar flexion and dorsiflexion at the ankle. He has normal inversion and eversion. SKIN: There is no erythema or rash noted on either ankle or foot. He does have some callus buildup on the heel bilaterally as well as on the lateral part of each foot. VASCULAR: Dorsalis pedis pulses are 2+ bilaterally equal. He has normal cap refill bilateral feet.  ASSESSMENT & PLAN:  See problem based charting & AVS for pt instructions.

## 2015-11-30 ENCOUNTER — Ambulatory Visit (INDEPENDENT_AMBULATORY_CARE_PROVIDER_SITE_OTHER): Payer: BLUE CROSS/BLUE SHIELD | Admitting: Family Medicine

## 2015-11-30 VITALS — BP 128/91 | Ht 74.0 in | Wt 325.0 lb

## 2015-11-30 DIAGNOSIS — M25571 Pain in right ankle and joints of right foot: Secondary | ICD-10-CM | POA: Diagnosis not present

## 2015-11-30 DIAGNOSIS — E669 Obesity, unspecified: Secondary | ICD-10-CM | POA: Diagnosis not present

## 2015-11-30 DIAGNOSIS — M2170 Unequal limb length (acquired), unspecified site: Secondary | ICD-10-CM | POA: Diagnosis not present

## 2015-11-30 NOTE — Progress Notes (Signed)
Patient ID: Julian Lucas, male   DOB: 02/28/1976, 40 y.o.   MRN: [id number] Right ankle pain.  Some improvement with the ankle brace. Shoe inserts I gave him really seem to rub uncomfortably on the midportion of his foot so he quit wearing them. He did buy new boots.  Patient was fitted for a : standard, cushioned, semi-rigid orthotic. The orthotic was heated, placed on the orthotic stand. The patient was positioned in subtalar neutral position and 10 degrees of ankle dorsiflexion in a weight bearing stance on the heated orthotic blank After completion of molding, a stable base was applied to the orthotic blank. The blank was ground to a stable position for weight bearing. Blank: Red foam size 13 Base: White F 4 Posting: None  Face to face time spent in evaluation, measurement and manufacture of custom molded orthotic was 45 minutes.

## 2016-03-20 ENCOUNTER — Ambulatory Visit (INDEPENDENT_AMBULATORY_CARE_PROVIDER_SITE_OTHER): Payer: BLUE CROSS/BLUE SHIELD | Admitting: Family Medicine

## 2016-03-20 ENCOUNTER — Encounter: Payer: Self-pay | Admitting: Family Medicine

## 2016-03-20 VITALS — BP 127/84 | HR 68 | Temp 98.3°F | Ht 74.0 in | Wt 330.4 lb

## 2016-03-20 DIAGNOSIS — M25579 Pain in unspecified ankle and joints of unspecified foot: Secondary | ICD-10-CM | POA: Diagnosis not present

## 2016-03-20 MED ORDER — TRAMADOL HCL 50 MG PO TABS: 100.0000 mg | ORAL_TABLET | Freq: Two times a day (BID) | ORAL | 0 refills | Status: DC | PRN

## 2016-03-20 MED ORDER — MELOXICAM 15 MG PO TABS: 15.0000 mg | ORAL_TABLET | Freq: Every day | ORAL | 3 refills | Status: DC

## 2016-03-20 NOTE — Assessment & Plan Note (Signed)
Patient is here with complaints of bilateral foot and ankle pain. History and physical exam shows evidence of metatarsalgia of the his feet bilaterally with right being worse than left. Etiology likely secondary to patient's collapse of the arches of his feet bilaterally. Patient also has a significant leg length discrepancy which I believe also is playing a part in his foot pain as well as his low back pain. - Referral to sports medicine for reassessment. My hope is to have patient reassessed for possible shoe inserts to help correct his leg length discrepancy as well as the collapse of his arches. - Refill tramadol >> patient takes rarely, only for "my bad days", last prescription lasted nearly 6 months.

## 2016-03-20 NOTE — Progress Notes (Signed)
   HPI  CC: Foot pain bilaterally Patient is here with complaints of bilateral foot pain. He states that he is significant issues with soreness along the majority of the plantar surface of his feet. He states that pain is significantly worse while working and he spends long hours standing during his job. He is wondering if there is any cream that could be provided to help him with this pain. We had a long discussion about his history of leg length discrepancy and flat feet. He states that he does not use his shoe lift as it causes significant discomfort.  He denies any injury, rash, wounds/sores, weakness, numbness, paresthesias.  CC, SH/smoking status, and VS noted  Objective: BP 127/84 (BP Location: Right Arm, Patient Position: Sitting, Cuff Size: Large)   Pulse 68   Temp 98.3 F (36.8 C) (Oral)   Ht 6\' 2"  (1.88 m)   Wt (!) 330 lb 6.4 oz (149.9 kg)   BMI 42.42 kg/m  Gen: NAD, alert, cooperative, and pleasant. CV: RRR, no murmur Resp: CTAB, no wheezes, non-labored Abd: SNTND, BS present, no guarding or organomegaly Ext: No edema, warm, left leg shorter than the right (~2cm), complete collapse of the transverse and lateral arches bilaterally. Valgus deviation noted at the tibiotalar joints. Strength 5/5 bilaterally. Pulses intact throughout.  Assessment and plan:  Pain in joint, ankle and foot Patient is here with complaints of bilateral foot and ankle pain. History and physical exam shows evidence of metatarsalgia of the his feet bilaterally with right being worse than left. Etiology likely secondary to patient's collapse of the arches of his feet bilaterally. Patient also has a significant leg length discrepancy which I believe also is playing a part in his foot pain as well as his low back pain. - Referral to sports medicine for reassessment. My hope is to have patient reassessed for possible shoe inserts to help correct his leg length discrepancy as well as the collapse of his  arches. - Refill tramadol >> patient takes rarely, only for "my bad days", last prescription lasted nearly 6 months.   Orders Placed This Encounter  Procedures  . Ambulatory referral to Sports Medicine    Referral Priority:   Routine    Referral Type:   Consultation    Number of Visits Requested:   1    Meds ordered this encounter  Medications  . traMADol (ULTRAM) 50 MG tablet    Sig: Take 2 tablets (100 mg total) by mouth every 12 (twelve) hours as needed for moderate pain.    Dispense:  120 tablet    Refill:  0  . meloxicam (MOBIC) 15 MG tablet    Sig: Take 1 tablet (15 mg total) by mouth daily.    Dispense:  30 tablet    Refill:  3     Elberta Leatherwood, MD,MS,  PGY3 03/20/2016 6:09 PM

## 2016-03-20 NOTE — Patient Instructions (Signed)
It was a pleasure seeing you today in our clinic. Today we discussed your foot pain. Here is the treatment plan we have discussed and agreed upon together:   - I've placed referral to sports medicine. You'll be contacted for an appointment to be scheduled. - Come back and see [place] as needed.

## 2016-03-28 ENCOUNTER — Ambulatory Visit: Payer: BLUE CROSS/BLUE SHIELD | Admitting: Family Medicine

## 2016-04-04 ENCOUNTER — Ambulatory Visit: Payer: BLUE CROSS/BLUE SHIELD | Admitting: Family Medicine

## 2016-04-11 ENCOUNTER — Ambulatory Visit (INDEPENDENT_AMBULATORY_CARE_PROVIDER_SITE_OTHER): Payer: BLUE CROSS/BLUE SHIELD | Admitting: Family Medicine

## 2016-04-11 ENCOUNTER — Encounter: Payer: Self-pay | Admitting: Family Medicine

## 2016-04-11 VITALS — BP 131/79 | HR 87 | Ht 74.0 in | Wt 330.0 lb

## 2016-04-11 DIAGNOSIS — M25579 Pain in unspecified ankle and joints of unspecified foot: Secondary | ICD-10-CM

## 2016-04-14 NOTE — Progress Notes (Signed)
Pt did not bring his orthotics so I could not adjust them. No charge and reschedule. Dorcas Mcmurray

## 2016-04-25 ENCOUNTER — Ambulatory Visit: Payer: BLUE CROSS/BLUE SHIELD | Admitting: Family Medicine

## 2016-05-02 ENCOUNTER — Ambulatory Visit: Payer: BLUE CROSS/BLUE SHIELD | Admitting: Family Medicine

## 2016-05-30 ENCOUNTER — Ambulatory Visit: Payer: BLUE CROSS/BLUE SHIELD | Admitting: Family Medicine

## 2016-06-23 ENCOUNTER — Encounter: Payer: Self-pay | Admitting: Family Medicine

## 2016-06-23 ENCOUNTER — Ambulatory Visit (INDEPENDENT_AMBULATORY_CARE_PROVIDER_SITE_OTHER): Payer: BLUE CROSS/BLUE SHIELD | Admitting: Family Medicine

## 2016-06-23 DIAGNOSIS — F329 Major depressive disorder, single episode, unspecified: Secondary | ICD-10-CM | POA: Diagnosis not present

## 2016-06-23 DIAGNOSIS — G8929 Other chronic pain: Secondary | ICD-10-CM

## 2016-06-23 DIAGNOSIS — M2550 Pain in unspecified joint: Secondary | ICD-10-CM | POA: Diagnosis not present

## 2016-06-23 DIAGNOSIS — F32A Depression, unspecified: Secondary | ICD-10-CM

## 2016-06-23 MED ORDER — TRAMADOL HCL 50 MG PO TABS: 100.0000 mg | ORAL_TABLET | Freq: Two times a day (BID) | ORAL | 0 refills | Status: DC | PRN

## 2016-06-23 MED ORDER — BUPROPION HCL ER (XL) 150 MG PO TB24: 150.0000 mg | ORAL_TABLET | Freq: Every day | ORAL | 1 refills | Status: DC

## 2016-06-23 NOTE — Progress Notes (Signed)
   HPI  CC:  Med refill and depression/anxiety. Patient is here with his wife for medication refill and to discuss some worsening depression/anxiety. His medication refills for his tramadol. Tramadol as for his chronic low back/knee pain. He states that tramadol has been very efficient keeping his pain within a tolerable state. He is able to work efficiently and has no side effects at this time. He does not wish to have any increase in dosing or dosage frequency. He is trying to lose some weight because he knows this will likely help improve some of his pain.  Depression/anxiety: Patient states that he has had a need for treatment for these issues in the past. He endorses a previous diagnosis of bipolar disorder (however our records does not show this). He has not been on any medication for quite some time but he thinks that he may need to restart this, with some encouragement from his new wife. He denies any SI/HI. He endorses signs and symptoms consistent with some depression but most symptoms seem to be anxiety related as he states he regularly feels "trapped" and overwhelmed "like things are collapsing all around me".  ROS: Denies fever, chills, headache, blurred vision, chest pain, shortness of breath, numbness, paresthesias, weakness, nausea, vomiting, diarrhea, hallucinations, violence.  CC, SH/smoking status, and VS noted  Objective: BP 137/80   Pulse 66   Temp 98 F (36.7 C) (Oral)   Ht 6\' 2"  (1.88 m)   Wt (!) 328 lb (148.8 kg)   BMI 42.11 kg/m  Gen: NAD, alert, cooperative, and pleasant. CV: RRR, no murmur Resp: CTAB, no wheezes, non-labored Neuro: Alert and oriented, Speech clear, No gross deficits  Assessment and plan:  Depression Patient is here with complaints of worsening depression. He would like to initiate a medication for this. He states that he was previously on Zoloft and Celexa but had very significant sexual dysfunction side effects and would like to avoid these  if possible. We discussed the possibility of Wellbutrin. I informed him that due to his use of tramadol he would be at increased risk for seizures as both of these medications can lower seizure threshold. Patient discussed this with his wife, stated their understanding, and decided to move forward with Wellbutrin. - GAD 7 = 11 - PHQ9 = 12 - Initiating Wellbutrin XL 150 mg daily. - Patient to follow-up in 1-2 months  Chronic pain of multiple joints Unchanged/stable: Continue current medication regimen. Discussed with patient about setting up a follow-up appointment to have orthotics looked at by sports medicine.   Meds ordered this encounter  Medications  . traMADol (ULTRAM) 50 MG tablet    Sig: Take 2 tablets (100 mg total) by mouth every 12 (twelve) hours as needed for moderate pain.    Dispense:  120 tablet    Refill:  0  . buPROPion (WELLBUTRIN XL) 150 MG 24 hr tablet    Sig: Take 1 tablet (150 mg total) by mouth daily.    Dispense:  60 tablet    Refill:  1     Elberta Leatherwood, MD,MS,  PGY3 06/25/2016 12:10 PM

## 2016-06-23 NOTE — Patient Instructions (Signed)
Bupropion extended-release tablets (Depression/Mood Disorders)  What is this medicine?  BUPROPION (byoo PROE pee on) is used to treat depression.  This medicine may be used for other purposes; ask your health care provider or pharmacist if you have questions.  What should I tell my health care provider before I take this medicine?  They need to know if you have any of these conditions:  -an eating disorder, such as anorexia or bulimia  -bipolar disorder or psychosis  -diabetes or high blood sugar, treated with medication  -glaucoma  -head injury or brain tumor  -heart disease, previous heart attack, or irregular heart beat  -high blood pressure  -kidney or liver disease  -seizures (convulsions)  -suicidal thoughts or a previous suicide attempt  -Tourette's syndrome  -weight loss  -an unusual or allergic reaction to bupropion, other medicines, foods, dyes, or preservatives  -breast-feeding  -pregnant or trying to become pregnant  How should I use this medicine?  Take this medicine by mouth with a glass of water. Follow the directions on the prescription label. You can take it with or without food. If it upsets your stomach, take it with food. Do not crush, chew, or cut these tablets. This medicine is taken once daily at the same time each day. Do not take your medicine more often than directed. Do not stop taking this medicine suddenly except upon the advice of your doctor. Stopping this medicine too quickly may cause serious side effects or your condition may worsen.  A special MedGuide will be given to you by the pharmacist with each prescription and refill. Be sure to read this information carefully each time.  Talk to your pediatrician regarding the use of this medicine in children. Special care may be needed.  Overdosage: If you think you have taken too much of this medicine contact a poison control center or emergency room at once.  NOTE: This medicine is only for you. Do not share this medicine with  others.  What if I miss a dose?  If you miss a dose, skip the missed dose and take your next tablet at the regular time. Do not take double or extra doses.  What may interact with this medicine?  Do not take this medicine with any of the following medications:  -linezolid  -MAOIs like Azilect, Carbex, Eldepryl, Marplan, Nardil, and Parnate  -methylene blue (injected into a vein)  -other medicines that contain bupropion like Zyban  This medicine may also interact with the following medications:  -alcohol  -certain medicines for anxiety or sleep  -certain medicines for blood pressure like metoprolol, propranolol  -certain medicines for depression or psychotic disturbances  -certain medicines for HIV or AIDS like efavirenz, lopinavir, nelfinavir, ritonavir  -certain medicines for irregular heart beat like propafenone, flecainide  -certain medicines for Parkinson's disease like amantadine, levodopa  -certain medicines for seizures like carbamazepine, phenytoin, phenobarbital  -cimetidine  -clopidogrel  -cyclophosphamide  -furazolidone  -isoniazid  -nicotine  -orphenadrine  -procarbazine  -steroid medicines like prednisone or cortisone  -stimulant medicines for attention disorders, weight loss, or to stay awake  -tamoxifen  -theophylline  -thiotepa  -ticlopidine  -tramadol  -warfarin  This list may not describe all possible interactions. Give your health care provider a list of all the medicines, herbs, non-prescription drugs, or dietary supplements you use. Also tell them if you smoke, drink alcohol, or use illegal drugs. Some items may interact with your medicine.  What should I watch for while using this medicine?    Tell your doctor if your symptoms do not get better or if they get worse. Visit your doctor or health care professional for regular checks on your progress. Because it may take several weeks to see the full effects of this medicine, it is important to continue your treatment as prescribed by your  doctor.  Patients and their families should watch out for new or worsening thoughts of suicide or depression. Also watch out for sudden changes in feelings such as feeling anxious, agitated, panicky, irritable, hostile, aggressive, impulsive, severely restless, overly excited and hyperactive, or not being able to sleep. If this happens, especially at the beginning of treatment or after a change in dose, call your health care professional.  Avoid alcoholic drinks while taking this medicine. Drinking large amounts of alcoholic beverages, using sleeping or anxiety medicines, or quickly stopping the use of these agents while taking this medicine may increase your risk for a seizure.  Do not drive or use heavy machinery until you know how this medicine affects you. This medicine can impair your ability to perform these tasks.  Do not take this medicine close to bedtime. It may prevent you from sleeping.  Your mouth may get dry. Chewing sugarless gum or sucking hard candy, and drinking plenty of water may help. Contact your doctor if the problem does not go away or is severe.  The tablet shell for some brands of this medicine does not dissolve. This is normal. The tablet shell may appear whole in the stool. This is not a cause for concern.  What side effects may I notice from receiving this medicine?  Side effects that you should report to your doctor or health care professional as soon as possible:  -allergic reactions like skin rash, itching or hives, swelling of the face, lips, or tongue  -breathing problems  -changes in vision  -confusion  -fast or irregular heartbeat  -hallucinations  -increased blood pressure  -redness, blistering, peeling or loosening of the skin, including inside the mouth  -seizures  -suicidal thoughts or other mood changes  -unusually weak or tired  -vomiting  Side effects that usually do not require medical attention (report to your doctor or health care professional if they continue or are  bothersome):  -change in sex drive or performance  -constipation  -headache  -loss of appetite  -nausea  -tremors  -weight loss  This list may not describe all possible side effects. Call your doctor for medical advice about side effects. You may report side effects to FDA at 1-800-FDA-1088.  Where should I keep my medicine?  Keep out of the reach of children.  Store at room temperature between 15 and 30 degrees C (59 and 86 degrees F). Throw away any unused medicine after the expiration date.  NOTE: This sheet is a summary. It may not cover all possible information. If you have questions about this medicine, talk to your doctor, pharmacist, or health care provider.     © 2016, Elsevier/Gold Standard. (2013-03-04 12:39:42)

## 2016-06-25 NOTE — Assessment & Plan Note (Signed)
Unchanged/stable: Continue current medication regimen. Discussed with patient about setting up a follow-up appointment to have orthotics looked at by sports medicine.

## 2016-06-25 NOTE — Assessment & Plan Note (Signed)
Patient is here with complaints of worsening depression. He would like to initiate a medication for this. He states that he was previously on Zoloft and Celexa but had very significant sexual dysfunction side effects and would like to avoid these if possible. We discussed the possibility of Wellbutrin. I informed him that due to his use of tramadol he would be at increased risk for seizures as both of these medications can lower seizure threshold. Patient discussed this with his wife, stated their understanding, and decided to move forward with Wellbutrin. - GAD 7 = 11 - PHQ9 = 12 - Initiating Wellbutrin XL 150 mg daily. - Patient to follow-up in 1-2 months

## 2016-09-10 ENCOUNTER — Encounter (HOSPITAL_COMMUNITY): Payer: Self-pay | Admitting: Emergency Medicine

## 2016-09-10 ENCOUNTER — Emergency Department (HOSPITAL_COMMUNITY)
Admission: EM | Admit: 2016-09-10 | Discharge: 2016-09-11 | Disposition: A | Discharge status: Home / Self Care | Payer: Self-pay | Attending: Emergency Medicine | Admitting: Emergency Medicine

## 2016-09-10 DIAGNOSIS — R109 Unspecified abdominal pain: Secondary | ICD-10-CM

## 2016-09-10 DIAGNOSIS — I10 Essential (primary) hypertension: Secondary | ICD-10-CM | POA: Insufficient documentation

## 2016-09-10 DIAGNOSIS — F17210 Nicotine dependence, cigarettes, uncomplicated: Secondary | ICD-10-CM | POA: Insufficient documentation

## 2016-09-10 DIAGNOSIS — R1010 Upper abdominal pain, unspecified: Secondary | ICD-10-CM | POA: Insufficient documentation

## 2016-09-10 NOTE — ED Triage Notes (Addendum)
Pt presents from home with epigastric pain x 2 days with n/v/d; pt states he has been eating spicy foods "lately"; denies hx of reflux; denies fevers; pt also reports his child/children being seen as pt/pts in this hospital

## 2016-09-10 NOTE — ED Provider Notes (Signed)
[facility] DEPT Provider Note   CSN: [id number] Arrival date & time: 09/10/16  2336  By signing my name below, I, Evelene Croon, attest that this documentation has been prepared under the direction and in the presence of Merrily Pew, MD . Electronically Signed: Evelene Croon, Scribe. 09/11/2016. 12:59 AM.  History   Chief Complaint Chief Complaint  Patient presents with  . Abdominal Pain  . Nausea  . Emesis    The history is provided by the patient. No language interpreter was used.     HPI Comments:  Julian Lucas is a 41 y.o. male who presents to the Emergency Department complaining of gradually worsening, upper abdominal pain x 2-3 days. He states the pain goes through to his back. Pt reports associated nausea and 1 episode of vomiting in the last 24 hours. He reports constipation ~ 1 week; took miralax and has had BMs since. Pt denies fevers, chills, SOB, dysuria, hematuria, acute leg swelling, and rash. He also denies chronic alcohol use. He admits to eating spicy foods.    Past Medical History:  Diagnosis Date  . DEGENERATIVE JOINT DISEASE, LEFT KNEE 09/09/2010   Qualifier: Diagnosis of  By: Oneida Alar MD, KARL    . HERPES GENITALIS 11/21/2009   Qualifier: Diagnosis of  By: Jeannine Kitten MD, Rodman Key    . Hypertension   . PULMONARY EMBOLISM, HX OF 11/21/2009   Qualifier: Diagnosis of  By: Tye Savoy MD, Tommi Rumps    . UNEQUAL LEG LENGTH 08/09/2010   Qualifier: Diagnosis of  By: Ernestina Patches MD, Remo Lipps      Patient Active Problem List   Diagnosis Date Noted  . Right foot pain 11/09/2015  . Pain in joint, ankle and foot 11/09/2015  . Heart burn 11/18/2013  . Dysphagia, unspecified(787.20) 11/18/2013  . Cervical disc disorder with radiculopathy of cervical region 07/27/2013  . Sciatica 01/06/2013  . Chronic pain of multiple joints 09/09/2010  . UNEQUAL LEG LENGTH 08/09/2010  . Depression 04/25/2010  . Genital herpes 11/21/2009  . Obesity, unspecified 11/21/2009  . TOBACCO ABUSE  11/21/2009  . HYPERTENSION, BENIGN ESSENTIAL 11/21/2009    Past Surgical History:  Procedure Laterality Date  . KNEE SURGERY    . NECK SURGERY    . TONSILECTOMY, ADENOIDECTOMY, BILATERAL MYRINGOTOMY AND TUBES         Home Medications    Prior to Admission medications   Medication Sig Start Date End Date Taking? Authorizing Provider  buPROPion (WELLBUTRIN XL) 150 MG 24 hr tablet Take 1 tablet (150 mg total) by mouth daily. 06/23/16   Elberta Leatherwood, MD  gabapentin (NEURONTIN) 300 MG capsule Take 1 capsule (300 mg total) by mouth 2 (two) times daily. 10/02/15   Elberta Leatherwood, MD  losartan (COZAAR) 50 MG tablet Take 1 tablet (50 mg total) by mouth daily. 10/02/15   Elberta Leatherwood, MD  meloxicam (MOBIC) 15 MG tablet Take 1 tablet (15 mg total) by mouth daily. 03/20/16 03/20/17  Elberta Leatherwood, MD  Multiple Vitamin (MULTIVITAMIN WITH MINERALS) TABS tablet Take 1 tablet by mouth daily.    Historical Provider, MD  ondansetron (ZOFRAN) 4 MG tablet Take 1 tablet (4 mg total) by mouth every 8 (eight) hours as needed for nausea or vomiting. 09/11/16   Merrily Pew, MD  traMADol (ULTRAM) 50 MG tablet Take 2 tablets (100 mg total) by mouth every 12 (twelve) hours as needed for moderate pain. 06/23/16   Elberta Leatherwood, MD    Family History Family History  Problem Relation Age  of Onset  . Lung cancer Father   . Depression Mother   . Hypertension Mother   . Heart disease Mother     Social History Social History  Substance Use Topics  . Smoking status: Current Every Day Smoker    Packs/day: 0.30    Types: Cigarettes  . Smokeless tobacco: Not on file     Comment: 6 cigs a day  . Alcohol use Yes     Allergies   Patient has no known allergies.   Review of Systems Review of Systems  Constitutional: Negative for chills and fever.  Respiratory: Negative for shortness of breath.   Cardiovascular: Negative for chest pain.  Gastrointestinal: Positive for abdominal pain, nausea and vomiting.    Genitourinary: Negative for dysuria and hematuria.  Skin: Negative for rash.  All other systems reviewed and are negative.    Physical Exam Updated Vital Signs BP 122/76 (BP Location: Right Arm) Comment: Simultaneous filing. User may not have seen previous data.  Pulse 76 Comment: Simultaneous filing. User may not have seen previous data.  Temp 98.8 F (37.1 C) (Oral)   Resp 20 Comment: Simultaneous filing. User may not have seen previous data.  Ht 6\' 2"  (1.88 m)   Wt (!) 330 lb (149.7 kg)   SpO2 99% Comment: Simultaneous filing. User may not have seen previous data.  BMI 42.37 kg/m   Physical Exam  Constitutional: He is oriented to person, place, and time. He appears well-developed and well-nourished. No distress.  HENT:  Head: Normocephalic and atraumatic.  Eyes: Conjunctivae are normal.  Cardiovascular: Normal rate, regular rhythm and normal heart sounds.  Exam reveals no gallop and no friction rub.   No murmur heard. Pulmonary/Chest: Effort normal and breath sounds normal. No respiratory distress.  Abdominal: Soft. He exhibits no distension and no mass. There is no tenderness. There is no rebound and no guarding.  No CVA tenderness  Musculoskeletal:  No midline lumbar/throacic tenderness   Neurological: He is alert and oriented to person, place, and time.  Skin: Skin is warm and dry. No rash noted.  Psychiatric: He has a normal mood and affect.  Nursing note and vitals reviewed.    ED Treatments / Results  DIAGNOSTIC STUDIES:  Oxygen Saturation is 99% on RA, normal by my interpretation.    COORDINATION OF CARE:  12:07 AM Discussed treatment plan with pt at bedside and pt agreed to plan.  Labs (all labs ordered are listed, but only abnormal results are displayed) Labs Reviewed  BASIC METABOLIC PANEL  CBC  LIPASE, BLOOD  I-STAT [place], ED    EKG  EKG Interpretation  Date/Time:  Wednesday September 10 2016 23:41:03 EST Ventricular Rate:  80 PR  Interval:  146 QRS Duration: 86 QT Interval:  362 QTC Calculation: 417 R Axis:   22 Text Interpretation:  Normal sinus rhythm Normal ECG No significant change since last tracing Confirmed by [facility] MD, Corene Cornea [phone]) on 09/11/2016 12:01:13 AM       Radiology Dg Chest 2 View  Result Date: 09/11/2016 CLINICAL DATA:  41 y/o M; upper abdominal pain radiating to the back with nausea and vomiting. EXAM: CHEST  2 VIEW COMPARISON:  08/30/2013 chest radiograph FINDINGS: The heart size and mediastinal contours are within normal limits and stable. Both lungs are clear. Anterior cervical discectomy fixation hardware noted IMPRESSION: No active cardiopulmonary disease. Electronically Signed   By: Kristine Garbe M.D.   On: 09/11/2016 00:55    Procedures Procedures (including critical care time)  Medications Ordered in ED Medications  ondansetron (ZOFRAN) 8 mg in sodium chloride 0.9 % 50 mL IVPB (8 mg Intravenous Given 09/11/16 0113)  ketorolac (TORADOL) 30 MG/ML injection 30 mg (30 mg Intravenous Given 09/11/16 0022)     Initial Impression / Assessment and Plan / ED Course  I have reviewed the triage vital signs and the nursing notes.  Pertinent labs & imaging results that were available during my care of the patient were reviewed by me and considered in my medical decision making (see chart for details).     Abdominal pain likely related to diet/miralax use and gas. Exam benign, repeat exam benign. Labs ok  Doubt intraabdominal pathology. Doubt cardiac cause. Plan for dc home with supportive care.   Final Clinical Impressions(s) / ED Diagnoses   Final diagnoses:  Abdominal pain, unspecified abdominal location    New Prescriptions New Prescriptions   ONDANSETRON (ZOFRAN) 4 MG TABLET    Take 1 tablet (4 mg total) by mouth every 8 (eight) hours as needed for nausea or vomiting.   I personally performed the services described in this documentation, which was scribed in my presence.  The recorded information has been reviewed and is accurate.     Merrily Pew, MD 09/11/16 0230

## 2016-09-10 NOTE — ED Notes (Signed)
In triage, pt began hyperventilating and became extremely diaphoretic; pt reported that the pain was worse and was not the same pain as he had experienced in the last 2 days

## 2016-09-11 ENCOUNTER — Emergency Department (HOSPITAL_COMMUNITY): Payer: Self-pay

## 2016-09-11 LAB — BASIC METABOLIC PANEL
Anion gap: 10 (ref 5–15)
BUN: 18 mg/dL (ref 6–20)
CALCIUM: 9.2 mg/dL (ref 8.9–10.3)
CHLORIDE: 106 mmol/L (ref 101–111)
CO2: 24 mmol/L (ref 22–32)
CREATININE: 0.7 mg/dL (ref 0.61–1.24)
GFR calc Af Amer: >60 mL/min (ref >60)
GFR calc non Af Amer: >60 mL/min (ref >60)
Glucose, Bld: 92 mg/dL (ref 65–99)
Potassium: 4.1 mmol/L (ref 3.5–5.1)
SODIUM: 140 mmol/L (ref 135–145)

## 2016-09-11 LAB — CBC
HCT: 46.2 % (ref 39.0–52.0)
Hemoglobin: 15.4 g/dL (ref 13.0–17.0)
MCH: 31.3 pg (ref 26.0–34.0)
MCHC: 33.3 g/dL (ref 30.0–36.0)
MCV: 93.9 fL (ref 78.0–100.0)
PLATELETS: 208 10*3/uL (ref 150–400)
RBC: 4.92 MIL/uL (ref 4.22–5.81)
RDW: 12.6 % (ref 11.5–15.5)
WBC: 6.5 10*3/uL (ref 4.0–10.5)

## 2016-09-11 LAB — LIPASE, BLOOD: LIPASE: 19 U/L (ref 11–51)

## 2016-09-11 LAB — I-STAT TROPONIN, ED: TROPONIN I, POC: 0 ng/mL (ref 0.00–0.08)

## 2016-09-11 MED ORDER — KETOROLAC TROMETHAMINE 30 MG/ML IJ SOLN
30.0000 mg | Freq: Once | INTRAMUSCULAR | Status: AC
  Administered 2016-09-11: 30 mg via INTRAVENOUS
  Filled 2016-09-11: qty 1

## 2016-09-11 MED ORDER — SODIUM CHLORIDE 0.9 % IV SOLN
8.0000 mg | Freq: Once | INTRAVENOUS | Status: AC
  Administered 2016-09-11: 8 mg via INTRAVENOUS
  Filled 2016-09-11: qty 4

## 2016-09-11 MED ORDER — ONDANSETRON HCL 4 MG PO TABS: 4.0000 mg | ORAL_TABLET | Freq: Three times a day (TID) | ORAL | 0 refills | Status: DC | PRN

## 2016-09-11 NOTE — ED Notes (Signed)
Patient verbalized understanding of discharge instructions and denies any further needs or questions at this time. VS stable. Patient ambulatory with steady gait.  

## 2016-11-12 ENCOUNTER — Other Ambulatory Visit: Payer: Self-pay | Admitting: Family Medicine

## 2016-11-19 ENCOUNTER — Ambulatory Visit: Payer: BLUE CROSS/BLUE SHIELD | Admitting: Family Medicine

## 2016-11-28 ENCOUNTER — Ambulatory Visit: Payer: Self-pay | Admitting: Family Medicine

## 2016-12-05 ENCOUNTER — Ambulatory Visit: Payer: Self-pay | Admitting: Family Medicine

## 2016-12-12 ENCOUNTER — Ambulatory Visit: Payer: Self-pay | Admitting: Family Medicine

## 2016-12-19 ENCOUNTER — Encounter: Payer: Self-pay | Admitting: Family Medicine

## 2016-12-19 ENCOUNTER — Ambulatory Visit (INDEPENDENT_AMBULATORY_CARE_PROVIDER_SITE_OTHER): Payer: PRIVATE HEALTH INSURANCE | Admitting: Family Medicine

## 2016-12-19 ENCOUNTER — Other Ambulatory Visit: Payer: Self-pay | Admitting: Family Medicine

## 2016-12-19 DIAGNOSIS — M25571 Pain in right ankle and joints of right foot: Secondary | ICD-10-CM

## 2016-12-19 MED ORDER — METHYLPREDNISOLONE ACETATE 40 MG/ML IJ SUSP
40.0000 mg | Freq: Once | INTRAMUSCULAR | Status: AC
  Administered 2016-12-19: 40 mg via INTRA_ARTICULAR

## 2016-12-20 NOTE — Progress Notes (Signed)
    CHIEF COMPLAINT / HPI:   Right ankle pain.  He has chronic problems bilateral ankles and left knee.  Previous history of traumatic knee injury resulting in a poorly functioning left knee and short left leg.  His right ankle has been aching, 6-8 out of 10 most of the time.  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  Ankle occasionally swells.  It feels stiff after he stands on for more than about 30 minutes.  It does not give way on him.  He uses the orthotics were made for him in his work shoes and that seems to help some but he notes since we last saw him his ankle pain has become both more intense and is now they're almost all the time.  REVIEW OF SYSTEMS:  No unusual weight change.  See HPI.  OBJECTIVE:  Vital signs are reviewed.   GENERAL: Well-developed extremely tall male no acute distress. GAIT: Antalgic.  Leg length discrepancy and poorly mobile left knee result in most of his weight Bearing on the right during stance phase with a shortened stance phase on the left. ANKLE: Right.  Mildly diffusely tender over the medial anterior joint line.  There is no defect.  He has intact plantar and dorsiflexion.  Some pain with eversion and inversion. KNEE: Left knee deformed.  5-10 extension lag.  He can flex to 110.  On that side is a little bit smaller than on the right.  INJECTION: Patient was given informed consent, signed copy in the chart. Appropriate time out was taken. Area prepped and draped in usual sterile fashion. 1 cc of methylprednisolone 40 mg/ml plus  1cc of 1% lidocaine without epinephrine was injected into the Right ankle joint using a(n) Anterior approach. The patient tolerated the procedure well. There were no complications. Post procedure instructions were given.   ASSESSMENT / PLAN: Please see problem oriented charting for details

## 2016-12-20 NOTE — Assessment & Plan Note (Signed)
I really think his pain comes from the fact that he's overweight and he bears most of his weight on the right side during stance and during gait secondary to his deficient left knee.  We discussed at length.  I had previously sent him to be evaluated for intervention on his left knee and they wanted him to be at least 40 years of age before they consider knee replacement.  He has attained at age but his insurance coverage is somewhat spotty.  I recommended he speak with his PCP about assistance programs including the one provided by [facility].  We'll be happy to refer him back  to ortthopedics once he is done this.  I don't think his ankle pains going to get any better until we can take care of his left knee.

## 2016-12-22 NOTE — Telephone Encounter (Signed)
Needs Appt

## 2016-12-23 NOTE — Telephone Encounter (Signed)
Spoke with patient, appointment scheduled for 5/7 with PCP.

## 2016-12-29 ENCOUNTER — Ambulatory Visit (INDEPENDENT_AMBULATORY_CARE_PROVIDER_SITE_OTHER): Payer: PRIVATE HEALTH INSURANCE | Admitting: Family Medicine

## 2016-12-29 ENCOUNTER — Encounter: Payer: Self-pay | Admitting: Family Medicine

## 2016-12-29 DIAGNOSIS — M25571 Pain in right ankle and joints of right foot: Secondary | ICD-10-CM | POA: Diagnosis not present

## 2016-12-29 DIAGNOSIS — M2170 Unequal limb length (acquired), unspecified site: Secondary | ICD-10-CM | POA: Diagnosis not present

## 2016-12-29 MED ORDER — GABAPENTIN 300 MG PO CAPS: 300.0000 mg | ORAL_CAPSULE | Freq: Two times a day (BID) | ORAL | 3 refills | Status: DC

## 2016-12-29 MED ORDER — TRAMADOL HCL 50 MG PO TABS: 100.0000 mg | ORAL_TABLET | Freq: Two times a day (BID) | ORAL | 0 refills | Status: DC | PRN

## 2016-12-29 MED ORDER — MELOXICAM 15 MG PO TABS: 15.0000 mg | ORAL_TABLET | Freq: Every day | ORAL | 3 refills | Status: AC

## 2016-12-29 NOTE — Patient Instructions (Signed)
It was a pleasure seeing you today in our clinic. Today we discussed your ankle and knee pain. Here is the treatment plan we have discussed and agreed upon together:  - Continue taking her pain medications as prescribed. - Look into insurance coverage if you are to undergo ankle surgery in the future. - Assuming you need no additional office visits within the next 2 months it has been a pleasure taking care of you. I will discuss your care with the provider who will be taking over for me.

## 2016-12-29 NOTE — Assessment & Plan Note (Signed)
Stable: Patient has significant right-sided foot and ankle pain likely secondary to obesity and lifelong leg length discrepancy. No new trauma or injury. Well-controlled on current medication regimen and occasional tibiotalar injections. - Ultram, meloxicam refilled - No injection today due to recent injection at sports medicine.

## 2016-12-29 NOTE — Progress Notes (Signed)
   HPI  CC: Right ankle pain Patient is here to follow-up on his right ankle pain. He states that he received a ankle injection at the sports medicine clinic a couple weeks ago. He endorses some improved symptoms since that time. 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 Patient states that his discomfort is significantly improved/well controlled with tramadol and occasional meloxicam.  Of note: Patient has a previously diagnosed leg length discrepancy. It is thought that this is the primary contributor to most of his lower extremity joints issues, as well as his obesity.   ROS: Denies any fevers, chills, headache, falls, trauma, injury, numbness, weakness, or paresthesias.  Objective: BP 124/82   Pulse 69   Temp 98.3 F (36.8 C) (Oral)   Ht 6\' 2"  (1.88 m)   Wt (!) 325 lb 6.4 oz (147.6 kg)   SpO2 96%   BMI 41.78 kg/m  Gen: NAD, alert, cooperative, and pleasant. CV: RRR, no murmur Resp: CTAB, no wheezes, non-labored Ext: No edema, warm, left leg shorter than the right (~2cm), complete collapse of the transverse and lateral arches bilaterally. Valgus deviation noted at the tibiotalar joints. Strength 5/5 bilaterally. Pulses intact throughout.   Assessment and plan:  Pain in joint, ankle and foot Stable: Patient has significant right-sided foot and ankle pain likely secondary to obesity and lifelong leg length discrepancy. No new trauma or injury. Well-controlled on current medication regimen and occasional tibiotalar injections. - Ultram, meloxicam refilled - No injection today due to recent injection at sports medicine.  UNEQUAL LEG LENGTH Stable: Likely primary contributor to patient's unilateral lower extremity pain. - Patient will likely require orthopedic referral in the future.   Meds ordered this encounter  Medications  . gabapentin (NEURONTIN) 300 MG capsule    Sig: Take 1 capsule (300 mg total) by  mouth 2 (two) times daily.    Dispense:  180 capsule    Refill:  3  . traMADol (ULTRAM) 50 MG tablet    Sig: Take 2 tablets (100 mg total) by mouth every 12 (twelve) hours as needed for moderate pain.    Dispense:  120 tablet    Refill:  0  . meloxicam (MOBIC) 15 MG tablet    Sig: Take 1 tablet (15 mg total) by mouth daily.    Dispense:  30 tablet    Refill:  3     Elberta Leatherwood, MD,MS,  PGY3 12/29/2016 12:08 PM

## 2016-12-29 NOTE — Assessment & Plan Note (Signed)
Stable: Likely primary contributor to patient's unilateral lower extremity pain. - Patient will likely require orthopedic referral in the future.

## 2017-02-19 ENCOUNTER — Telehealth: Payer: Self-pay | Admitting: *Deleted

## 2017-02-19 NOTE — Telephone Encounter (Signed)
Pharmacist from [facility] called stating patient brought a Rx for Tramadol written on 12/29/2016.  The would like to know if they can fill the prescription now. Please advise. Derl Barrow, RN

## 2017-02-19 NOTE — Telephone Encounter (Signed)
2nd request. ep °

## 2017-02-20 NOTE — Telephone Encounter (Signed)
Yes. Called pharm back. Get filled.

## 2017-04-03 ENCOUNTER — Ambulatory Visit: Payer: PRIVATE HEALTH INSURANCE | Admitting: Family Medicine

## 2017-04-07 ENCOUNTER — Other Ambulatory Visit: Payer: Self-pay | Admitting: Family Medicine

## 2017-04-17 ENCOUNTER — Encounter: Payer: Self-pay | Admitting: Family Medicine

## 2017-04-17 ENCOUNTER — Ambulatory Visit (INDEPENDENT_AMBULATORY_CARE_PROVIDER_SITE_OTHER): Payer: PRIVATE HEALTH INSURANCE | Admitting: Family Medicine

## 2017-04-17 DIAGNOSIS — M5010 Cervical disc disorder with radiculopathy, unspecified cervical region: Secondary | ICD-10-CM

## 2017-04-17 DIAGNOSIS — G8929 Other chronic pain: Secondary | ICD-10-CM

## 2017-04-17 DIAGNOSIS — I10 Essential (primary) hypertension: Secondary | ICD-10-CM

## 2017-04-17 DIAGNOSIS — M25571 Pain in right ankle and joints of right foot: Secondary | ICD-10-CM | POA: Diagnosis not present

## 2017-04-17 DIAGNOSIS — M2550 Pain in unspecified joint: Secondary | ICD-10-CM

## 2017-04-17 DIAGNOSIS — F17200 Nicotine dependence, unspecified, uncomplicated: Secondary | ICD-10-CM | POA: Diagnosis not present

## 2017-04-17 MED ORDER — TRAMADOL HCL 50 MG PO TABS: 100.0000 mg | ORAL_TABLET | Freq: Two times a day (BID) | ORAL | 2 refills | Status: DC | PRN

## 2017-04-17 MED ORDER — GABAPENTIN 300 MG PO CAPS: 300.0000 mg | ORAL_CAPSULE | Freq: Two times a day (BID) | ORAL | 3 refills | Status: DC

## 2017-04-17 NOTE — Assessment & Plan Note (Signed)
BP well controlled today, has been compliant with medication. Continue cozaar.

## 2017-04-17 NOTE — Assessment & Plan Note (Signed)
Continued bothersome tingling in bilateral forearms. No weakness, negative spurlings. Not compliant with prescribed gabapentin, asked that he increase to 300mg  QHS and titrate to 600mg  QHS if he can tolerate. Return if worsening symptoms.

## 2017-04-17 NOTE — Patient Instructions (Signed)
It was a pleasure to see you today! Thank you for choosing [facility] for your primary care. Julian Lucas was seen for med refills, arm numbness.   Our plans for today were:  Continue using your tramadol for knee pain.   For your arm numbness, try increasing your dose of gabapentin. I would like you to try 300mg  (your prescription should be for 300mg  capsules, not 100mg , but check the bottle) before bedtime. If this helps the numbness and you aren't too sleepy, you can increase to 600mg  every night before bed.   Try to work on decreasing smoking.   You should return to our clinic to see Dr. Lindell Noe in 6 months for blood pressure, knee pain.   Best,  Dr. Lindell Noe

## 2017-04-17 NOTE — Assessment & Plan Note (Signed)
Patient precontemplative on diet or exercise changes today. Counseled that his chronic pain in both his neck and his legs will improve with less weight to bear on these joints. Return to discuss interventions when he is ready.

## 2017-04-17 NOTE — Assessment & Plan Note (Signed)
Pain in bilateral lower extremities today, including his L knee, which he states needs to be replaced but he cannot afford this at present. Recommended that he speak with Colletta Maryland regarding orange card. Continue tramadol, patient does not report concerning use habits. Reports pain relief with 2 tramadol per day only on work days when he has to stand.

## 2017-04-17 NOTE — Assessment & Plan Note (Deleted)
Severe OA of

## 2017-04-17 NOTE — Assessment & Plan Note (Signed)
Smokes "a lot" of marijuana, precontemplative regarding cessation. We will continue to discuss this. Offered help with cutting down when he is ready.

## 2017-04-17 NOTE — Progress Notes (Signed)
CC: med refills  HPI  Knee pain - knows he needs a knee replacement, has not set this up because he cannot afford to take the time off of work to recover. Saw Dr. Nori Riis for that in the past. Had knee surgery when he was a child. OA. Takes tramadol 6 days per week, takes 2 pills before his 12 hour shift. This controls his pain well. No change in pain. No new weakness. A  HTN - Hypertension: Patient here for follow-up of elevated blood pressure. He is not exercising and is not adherent to low salt diet.  Blood pressure is not checked at home. Denies CP, HA, SOB.  Arm numbness - long standing. Takes gabapentin mostly when it flares up. No change to the sensation of numbness. rx'd gabapentin for this, and only been taking 100mg  PRN. No weakness. No new injury.  Obesity - gained 7lb since last visit. Denies exercise. States "I'm not gonna lie to you, I eat a lot." Works at [employer] and [employer] which is tempting for him. Suggests he is not motivated to lose weight. Has "always been this big."  ROS: denies CP, SOB, HA, rash, abd pain, dysuria, changes in BMs.    CC, SH/smoking status, and VS noted. Smokes marijuana "a lot," precontemplative regarding cessation, states it helps with his pain.  Objective: BP 118/72   Pulse 67   Temp 98.3 F (36.8 C) (Oral)   Ht 6\' 2"  (1.88 m)   Wt (!) 331 lb 12.8 oz (150.5 kg)   SpO2 97%   BMI 42.60 kg/m  Gen: NAD, alert, cooperative, and pleasant. HEENT: NCAT, EOMI, PERRL CV: RRR, no murmur Resp: CTAB, no wheezes, non-labored Ext: No edema, warm. 5/5 strength in bilateral grip and arms.  Neuro: Alert and oriented, Speech clear, No gross deficits  Assessment and plan:  HYPERTENSION, BENIGN ESSENTIAL BP well controlled today, has been compliant with medication. Continue cozaar.  Cervical disc disorder with radiculopathy of cervical region Continued bothersome tingling in bilateral forearms. No weakness, negative spurlings. Not compliant with  prescribed gabapentin, asked that he increase to 300mg  QHS and titrate to 600mg  QHS if he can tolerate. Return if worsening symptoms.  TOBACCO ABUSE Smokes "a lot" of marijuana, precontemplative regarding cessation. We will continue to discuss this. Offered help with cutting down when he is ready.  Pain in joint, ankle and foot Pain in bilateral lower extremities today, including his L knee, which he states needs to be replaced but he cannot afford this at present. Recommended that he speak with Colletta Maryland regarding orange card. Continue tramadol, patient does not report concerning use habits. Reports pain relief with 2 tramadol per day only on work days when he has to stand.  Severe obesity (BMI >= 40) (HCC) Patient precontemplative on diet or exercise changes today. Counseled that his chronic pain in both his neck and his legs will improve with less weight to bear on these joints. Return to discuss interventions when he is ready.   No orders of the defined types were placed in this encounter.  Body mass index is 42.6 kg/m.  Meds ordered this encounter  Medications  . traMADol (ULTRAM) 50 MG tablet    Sig: Take 2 tablets (100 mg total) by mouth every 12 (twelve) hours as needed for moderate pain.    Dispense:  120 tablet    Refill:  2  . gabapentin (NEURONTIN) 300 MG capsule    Sig: Take 1 capsule (300 mg total) by mouth  2 (two) times daily.    Dispense:  180 capsule    Refill:  3    Health Maintenance reviewed -patient declined flu shot.  Ralene Ok, MD, PGY2 04/17/2017 10:28 AM

## 2017-07-10 ENCOUNTER — Ambulatory Visit: Payer: PRIVATE HEALTH INSURANCE | Admitting: Student

## 2017-07-24 ENCOUNTER — Emergency Department (HOSPITAL_COMMUNITY): Payer: PRIVATE HEALTH INSURANCE

## 2017-07-24 ENCOUNTER — Encounter (HOSPITAL_COMMUNITY): Payer: Self-pay | Admitting: Emergency Medicine

## 2017-07-24 ENCOUNTER — Other Ambulatory Visit: Payer: Self-pay

## 2017-07-24 ENCOUNTER — Emergency Department (HOSPITAL_COMMUNITY)
Admission: EM | Admit: 2017-07-24 | Discharge: 2017-07-24 | Disposition: A | Discharge status: Home / Self Care | Payer: PRIVATE HEALTH INSURANCE | Attending: Emergency Medicine | Admitting: Emergency Medicine

## 2017-07-24 DIAGNOSIS — R1011 Right upper quadrant pain: Secondary | ICD-10-CM | POA: Diagnosis not present

## 2017-07-24 DIAGNOSIS — I10 Essential (primary) hypertension: Secondary | ICD-10-CM | POA: Insufficient documentation

## 2017-07-24 DIAGNOSIS — Z79899 Other long term (current) drug therapy: Secondary | ICD-10-CM | POA: Diagnosis not present

## 2017-07-24 DIAGNOSIS — R200 Anesthesia of skin: Secondary | ICD-10-CM | POA: Insufficient documentation

## 2017-07-24 DIAGNOSIS — F17210 Nicotine dependence, cigarettes, uncomplicated: Secondary | ICD-10-CM | POA: Insufficient documentation

## 2017-07-24 DIAGNOSIS — R1010 Upper abdominal pain, unspecified: Secondary | ICD-10-CM

## 2017-07-24 DIAGNOSIS — R202 Paresthesia of skin: Secondary | ICD-10-CM | POA: Diagnosis not present

## 2017-07-24 DIAGNOSIS — Z720 Tobacco use: Secondary | ICD-10-CM

## 2017-07-24 LAB — URINALYSIS, ROUTINE W REFLEX MICROSCOPIC
BILIRUBIN URINE: NEGATIVE
Bacteria, UA: NONE SEEN
Glucose, UA: NEGATIVE mg/dL
Ketones, ur: NEGATIVE mg/dL
Leukocytes, UA: NEGATIVE
Nitrite: NEGATIVE
PH: 5 (ref 5.0–8.0)
Protein, ur: NEGATIVE mg/dL
SPECIFIC GRAVITY, URINE: 1.027 (ref 1.005–1.030)
Squamous Epithelial / LPF: NONE SEEN

## 2017-07-24 LAB — COMPREHENSIVE METABOLIC PANEL
ALK PHOS: 82 U/L (ref 38–126)
ALT: 23 U/L (ref 17–63)
AST: 21 U/L (ref 15–41)
Albumin: 3.9 g/dL (ref 3.5–5.0)
Anion gap: 7 (ref 5–15)
BUN: 19 mg/dL (ref 6–20)
CALCIUM: 9.1 mg/dL (ref 8.9–10.3)
CO2: 23 mmol/L (ref 22–32)
CREATININE: 0.76 mg/dL (ref 0.61–1.24)
Chloride: 109 mmol/L (ref 101–111)
Glucose, Bld: 100 mg/dL — ABNORMAL HIGH (ref 65–99)
Potassium: 3.8 mmol/L (ref 3.5–5.1)
Sodium: 139 mmol/L (ref 135–145)
Total Bilirubin: 0.8 mg/dL (ref 0.3–1.2)
Total Protein: 7.4 g/dL (ref 6.5–8.1)

## 2017-07-24 LAB — CBC
HCT: 41 % (ref 39.0–52.0)
Hemoglobin: 13.6 g/dL (ref 13.0–17.0)
MCH: 31.1 pg (ref 26.0–34.0)
MCHC: 33.2 g/dL (ref 30.0–36.0)
MCV: 93.8 fL (ref 78.0–100.0)
PLATELETS: 224 10*3/uL (ref 150–400)
RBC: 4.37 MIL/uL (ref 4.22–5.81)
RDW: 12.8 % (ref 11.5–15.5)
WBC: 7 10*3/uL (ref 4.0–10.5)

## 2017-07-24 LAB — LIPASE, BLOOD: Lipase: 25 U/L (ref 11–51)

## 2017-07-24 MED ORDER — IOPAMIDOL (ISOVUE-300) INJECTION 61%
INTRAVENOUS | Status: AC
  Administered 2017-07-24: 100 mL
  Filled 2017-07-24: qty 100

## 2017-07-24 MED ORDER — OMEPRAZOLE 20 MG PO CPDR: 20.0000 mg | DELAYED_RELEASE_CAPSULE | Freq: Every day | ORAL | 0 refills | Status: DC

## 2017-07-24 NOTE — ED Triage Notes (Signed)
Patient reports RUQ pain , bilateral hand pain and right thigh tingling onset this week , ambulatory , no emesis or diarrhea , denies fever or chills .

## 2017-07-24 NOTE — ED Provider Notes (Signed)
[facility] EMERGENCY DEPARTMENT Provider Note   CSN: [id number] Arrival date & time: 07/24/17  0422     History   Chief Complaint Chief Complaint  Patient presents with  . Abdominal Pain    Leg Tingling  . Hand Pain    HPI Julian Lucas is a 41 y.o. male.  Patient presents with several days of upper abdominal pain that has been constant.  She states it is worse with palpation and worse with bending over.  He denies any nausea, vomiting, fever.  No diarrhea.  The pain is somewhat worse when he eats.  Denies any pain with urination or blood in the urine.  No previous abdominal surgeries.  Patient also states he is having bilateral forearm and hand tingling and numbness.  He initially stated this was secondary to his abdominal pain but on further questioning he states this is been going on for several months and he has seen his PCP about it who increased his gabapentin.  He denies any change in his chronic numbness pattern.  No weakness in his arms.  No head or neck pain.  Patient with history of cervical spine surgery remotely.   The history is provided by the patient.    Past Medical History:  Diagnosis Date  . DEGENERATIVE JOINT DISEASE, LEFT KNEE 09/09/2010   Qualifier: Diagnosis of  By: Oneida Alar MD, KARL    . HERPES GENITALIS 11/21/2009   Qualifier: Diagnosis of  By: Jeannine Kitten MD, Rodman Key    . Hypertension   . PULMONARY EMBOLISM, HX OF 11/21/2009   Qualifier: Diagnosis of  By: Tye Savoy MD, Tommi Rumps    . UNEQUAL LEG LENGTH 08/09/2010   Qualifier: Diagnosis of  By: Ernestina Patches MD, Remo Lipps      Patient Active Problem List   Diagnosis Date Noted  . Right foot pain 11/09/2015  . Pain in joint, ankle and foot 11/09/2015  . Heart burn 11/18/2013  . Dysphagia, unspecified(787.20) 11/18/2013  . Cervical disc disorder with radiculopathy of cervical region 07/27/2013  . Sciatica 01/06/2013  . Chronic pain of multiple joints 09/09/2010  . UNEQUAL LEG LENGTH 08/09/2010  .  Depression 04/25/2010  . Genital herpes 11/21/2009  . Severe obesity (BMI >= 40) ([place]) 11/21/2009  . TOBACCO ABUSE 11/21/2009  . HYPERTENSION, BENIGN ESSENTIAL 11/21/2009    Past Surgical History:  Procedure Laterality Date  . KNEE SURGERY    . NECK SURGERY    . TONSILECTOMY, ADENOIDECTOMY, BILATERAL MYRINGOTOMY AND TUBES         Home Medications    Prior to Admission medications   Medication Sig Start Date End Date Taking? Authorizing Provider  gabapentin (NEURONTIN) 300 MG capsule Take 1 capsule (300 mg total) by mouth 2 (two) times daily. 04/17/17   Sela Hilding, MD  losartan (COZAAR) 50 MG tablet TAKE ONE TABLET BY MOUTH DAILY 11/12/16   McKeag, Marylynn Pearson, MD  meloxicam (MOBIC) 15 MG tablet Take 1 tablet (15 mg total) by mouth daily. 12/29/16 12/29/17  McKeag, Marylynn Pearson, MD  Multiple Vitamin (MULTIVITAMIN WITH MINERALS) TABS tablet Take 1 tablet by mouth daily.    [provider]  traMADol (ULTRAM) 50 MG tablet Take 2 tablets (100 mg total) by mouth every 12 (twelve) hours as needed for moderate pain. 04/17/17   Sela Hilding, MD    Family History Family History  Problem Relation Age of Onset  . Lung cancer Father   . Depression Mother   . Hypertension Mother   . Heart disease Mother  Social History Social History   Tobacco Use  . Smoking status: Current Every Day Smoker    Packs/day: 0.30    Types: Cigarettes  . Smokeless tobacco: Never Used  . Tobacco comment: 6 cigs a day  Substance Use Topics  . Alcohol use: Yes  . Drug use: Yes    Types: Marijuana     Allergies   Patient has no known allergies.   Review of Systems Review of Systems  Constitutional: Negative for activity change, appetite change and fever.  HENT: Negative for congestion and rhinorrhea.   Eyes: Negative for visual disturbance.  Respiratory: Negative for chest tightness and shortness of breath.   Cardiovascular: Negative for chest pain and leg swelling.  Gastrointestinal:  Positive for abdominal pain. Negative for diarrhea, nausea and vomiting.  Genitourinary: Negative for dysuria, hematuria, testicular pain and urgency.  Musculoskeletal: Negative for arthralgias and myalgias.  Skin: Negative for rash.  Neurological: Positive for numbness. Negative for dizziness and weakness.    all other systems are negative except as noted in the HPI and PMH.    Physical Exam Updated Vital Signs BP 115/78 (BP Location: Right Arm)   Pulse 98   Temp 97.9 F (36.6 C) (Oral)   Resp 18   SpO2 95%   Physical Exam  Constitutional: He is oriented to person, place, and time. He appears well-developed and well-nourished. No distress.  HENT:  Head: Normocephalic and atraumatic.  Mouth/Throat: Oropharynx is clear and moist. No oropharyngeal exudate.  Eyes: Conjunctivae and EOM are normal. Pupils are equal, round, and reactive to light.  Neck: Normal range of motion. Neck supple.  No meningismus.  Cardiovascular: Normal rate, regular rhythm, normal heart sounds and intact distal pulses.  No murmur heard. Pulmonary/Chest: Effort normal and breath sounds normal. No respiratory distress.  Abdominal: Soft. There is tenderness. There is guarding. There is no rebound.  Obese TTP RUQ with voluntary guarding  Musculoskeletal: Normal range of motion. He exhibits no edema or tenderness.  Neurological: He is alert and oriented to person, place, and time. No cranial nerve deficit. He exhibits normal muscle tone. Coordination normal.  No ataxia on finger to nose bilaterally. No pronator drift. 5/5 strength throughout. CN 2-12 intact.Equal grip strength. Sensation intact.   Subjective numbness to hands and arms bilaterally. Distal pulses intact. Cardinal hand movements intact. Spurling's negative. Phelan's and tinel's negative.  Skin: Skin is warm.  Psychiatric: He has a normal mood and affect. His behavior is normal.  Nursing note and vitals reviewed.    ED Treatments / Results    Labs (all labs ordered are listed, but only abnormal results are displayed) Labs Reviewed  COMPREHENSIVE METABOLIC PANEL - Abnormal; Notable for the following components:      Result Value   Glucose, Bld 100 (*)    All other components within normal limits  URINALYSIS, ROUTINE W REFLEX MICROSCOPIC - Abnormal; Notable for the following components:   Hgb urine dipstick SMALL (*)    All other components within normal limits  LIPASE, BLOOD  CBC    EKG  EKG Interpretation None       Radiology US Abdomen Limited Ruq  Result Date: 07/24/2017 CLINICAL DATA:  Right upper quadrant pain for 4 days. EXAM: ULTRASOUND ABDOMEN LIMITED RIGHT UPPER QUADRANT COMPARISON:  Right mid CT 03/10/2011 FINDINGS: Gallbladder: Physiologically distended. No gallstones or wall thickening visualized. A positive sonographic Percell Miller sign was noted by sonographer. Common bile duct: Diameter: 3 mm Liver: No focal lesion identified. Mildly increased  and heterogeneous in parenchymal echogenicity. Portal vein is patent on color Doppler imaging with normal direction of blood flow towards the liver. IMPRESSION: 1. Normal sonographic appearance of the gallbladder, however positive sonographic Murphy sign, of uncertain clinical significance. 2. Hepatic steatosis. Electronically Signed   By: Jeb Levering M.D.   On: 07/24/2017 06:43    Procedures Procedures (including critical care time)  Medications Ordered in ED Medications - No data to display   Initial Impression / Assessment and Plan / ED Course  I have reviewed the triage vital signs and the nursing notes.  Pertinent labs & imaging results that were available during my care of the patient were reviewed by me and considered in my medical decision making (see chart for details).    Several days of upper abdominal pain.  No vomiting or diarrhea.  Concern for gallbladder pathology.  LFTs and lipase are normal.  RUQ shows normal appearing gallbladder but  positive sonographic Murphy's sign. No gallstones or wall thickening.   Pain persists.  Will obtain CT scan to further evaluate upper abdominal pain.  Dr. Eulis Foster to assume care. Anticipate starting PPI if CT reassuring.   Final Clinical Impressions(s) / ED Diagnoses   Final diagnoses:  RUQ pain  Upper abdominal pain    ED Discharge Orders    None       Eliette Drumwright, Annie Main, MD 07/24/17 [phone]

## 2017-07-24 NOTE — Discharge Instructions (Addendum)
Your testing shows a normal gallbladder. Take the stomach medications as prescribed. Follow up with your doctor. Return to the ED if you develop new or worsening symptoms.   It is important to stop smoking cigarettes since they can cause more acid to be produce and potentially worsen your condition.

## 2017-07-24 NOTE — ED Notes (Signed)
Patient transported to Ultrasound 

## 2017-07-24 NOTE — ED Notes (Signed)
ED Provider at bedside. 

## 2017-07-24 NOTE — ED Provider Notes (Signed)
Patient evaluated post CT imaging.  CT does not show acute changes, possible liver cysts are present.  Patient is comfortable at this time.  Findings discussed with the patient and all questions answered.  He has a follow-up appointment scheduled for 07/27/17 with his PCP.  He plans on discussing the CT results with them at that time.   Daleen Bo, MD 07/24/17 1002

## 2017-07-27 ENCOUNTER — Encounter: Payer: Self-pay | Admitting: Student

## 2017-07-27 ENCOUNTER — Other Ambulatory Visit: Payer: Self-pay

## 2017-07-27 ENCOUNTER — Ambulatory Visit (INDEPENDENT_AMBULATORY_CARE_PROVIDER_SITE_OTHER): Payer: PRIVATE HEALTH INSURANCE | Admitting: Student

## 2017-07-27 VITALS — BP 110/68 | HR 69 | Temp 98.9°F | Ht 74.0 in | Wt 337.4 lb

## 2017-07-27 DIAGNOSIS — R200 Anesthesia of skin: Secondary | ICD-10-CM | POA: Diagnosis not present

## 2017-07-27 MED ORDER — DULOXETINE HCL 30 MG PO CPEP: 30.0000 mg | ORAL_CAPSULE | Freq: Every day | ORAL | 0 refills | Status: DC

## 2017-07-27 MED ORDER — GABAPENTIN 300 MG PO CAPS: 300.0000 mg | ORAL_CAPSULE | Freq: Two times a day (BID) | ORAL | 3 refills | Status: DC

## 2017-07-27 NOTE — Patient Instructions (Signed)
It was great seeing you today! We have addressed the following issues today  Numbness: We are checking your vitamin D and vitamin B12 level.  I also recommend taking multivitamin which is available over-the-counter.  We also started you on Cymbalta.  You can take 1 capsule a day for 2 weeks then you can increase to 2 capsules daily.  If we did any lab work today, and the results require attention, either me or my nurse will get in touch with you. If everything is normal, you will get a letter in mail and a message via . If you don't hear from [place] in two weeks, please give [place] a call. Otherwise, we look forward to seeing you again at your next visit. If you have any questions or concerns before then, please call the clinic at [phone].  Please bring all your medications to every doctors visit  Sign up for My Chart to have easy access to your labs results, and communication with your Primary care physician.    Please check-out at the front desk before leaving the clinic.    Take Care,   Dr. Cyndia Skeeters

## 2017-07-27 NOTE — Progress Notes (Signed)
Subjective:    Julian Lucas is a 41 y.o. old male here for numbness  HPI Numbness: mainly over his right side of his body but also in both arms, hands, fingers and right leg. This has been going on for three months. Getting worse.  Also weakness in his left arm. Reports balance issue for years. Denies changes with balance issue.  Denies fall. Reports weakness in both arms. Denies history of syphilis or diabetes.  Has remote history of depression.  He is not on any medication for this.  He is also on omeprazole for long time.  Patient has history degenerative disc disease and radiculopathy with ACDF at C4-6 in 2012.  He also reports issues with controlling his urine but denies urinary retention or bowel incontinence.  He denies fever, chills, unintentional weight loss or saddle anesthesia  Patient was seen in clinic about 3 months ago for the same problem.  At that time his gabapentin was increased to 300 mg.  He denies improvement with this.   PMH/Problem List: has Genital herpes; Severe obesity (BMI >= 40) ([place]); TOBACCO ABUSE; Depression; HYPERTENSION, BENIGN ESSENTIAL; Chronic pain of multiple joints; UNEQUAL LEG LENGTH; Sciatica; Cervical disc disorder with radiculopathy of cervical region; Heart burn; Dysphagia, unspecified(787.20); Right foot pain; and Pain in joint, ankle and foot on their problem list.   has a past medical history of DEGENERATIVE JOINT DISEASE, LEFT KNEE (09/09/2010), HERPES GENITALIS (11/21/2009), Hypertension, PULMONARY EMBOLISM, HX OF (11/21/2009), and UNEQUAL LEG LENGTH (08/09/2010).  FH:  Family History  Problem Relation Age of Onset  . Lung cancer Father   . Depression Mother   . Hypertension Mother   . Heart disease Mother     SH Social History   Tobacco Use  . Smoking status: Current Every Day Smoker    Packs/day: 0.30    Types: Cigarettes  . Smokeless tobacco: Never Used  . Tobacco comment: 6 cigs a day  Substance Use Topics  . Alcohol use: Yes  . Drug  use: Yes    Types: Marijuana    Review of Systems Review of systems negative except for pertinent positives and negatives in history of present illness above.     Objective:     Vitals:   07/27/17 1355  BP: 110/68  Pulse: 69  Temp: 98.9 F (37.2 C)  TempSrc: Oral  SpO2: 96%  Weight: (!) 337 lb 6.4 oz (153 kg)  Height: 6\' 2"  (1.88 m)   Body mass index is 43.32 kg/m.  Physical Exam  GEN: appears well, no apparent distress. Head: normocephalic and atraumatic  Eyes: conjunctiva without injection, sclera anicteric Ears: external ear and ear canal normal Nares: no rhinorrhea, congestion or erythema Oropharynx: mmm without erythema or exudation HEM: negative for cervical or periauricular lymphadenopathies CVS: RRR, nl s1 & s2, no murmurs, no edema, RESP: no IWOB, good air movement bilaterally, CTAB GI: BS present & normal, soft, NTND, no guarding, no rebound, no mass GU: no suprapubic or CVA tenderness MSK: no focal tenderness or notable swelling SKIN: no apparent skin lesion ENDO: negative thyromegally NEURO: motor 5/5 in all muscle groups of UE and LE bilaterally, normal tone, light sensation intact in all dermatomes of upper and lower ext bilaterally, biceps and patellar reflexes 2+ bilaterally PSYCH: flat affect, looks fatigued    Assessment and Plan:  1. Numbness: not dermatomal. Suspicious for micronutrient deficiency.  He also has history of depression.  He has flat affect and looks fatigued.  I wonder if this could also  be somatic manifestation of his underlying mood issue.  He is reporting no significant improvement with gabapentin. -Recommended taking multivitamin daily. -We will check vitamin B12 and vitamin D level. -This he has been monogamous with wife over 10 years.  So low suspicion for syphilis. -We will start him on Cymbalta.  This could help with the numbness as well as his underlying mood issue.  He denies history of symptoms of bipolar  disorder. -Refilled his gabapentin as well.  Return if symptoms worsen or fail to improve.  Mercy Riding, MD 07/29/17 Pager: [phone]

## 2017-07-28 LAB — VITAMIN B12: Vitamin B-12: 638 pg/mL (ref 232–1245)

## 2017-07-28 LAB — VITAMIN D 25 HYDROXY (VIT D DEFICIENCY, FRACTURES): Vit D, 25-Hydroxy: 9.8 ng/mL — ABNORMAL LOW (ref 30.0–100.0)

## 2017-07-29 ENCOUNTER — Encounter: Payer: Self-pay | Admitting: Student

## 2017-07-30 ENCOUNTER — Telehealth: Payer: Self-pay | Admitting: Student

## 2017-07-30 ENCOUNTER — Encounter: Payer: Self-pay | Admitting: Student

## 2017-07-30 DIAGNOSIS — E559 Vitamin D deficiency, unspecified: Secondary | ICD-10-CM

## 2017-07-30 MED ORDER — VITAMIN D (ERGOCALCIFEROL) 1.25 MG (50000 UNIT) PO CAPS: 50000.0000 [IU] | ORAL_CAPSULE | ORAL | 0 refills | Status: DC

## 2017-07-30 NOTE — Telephone Encounter (Signed)
Attempted to call patient to discuss about his vit D level which is very low. Left voice mail to call back. I have sent a prescription for vitamin D 50,000 units to his pharmacy. He takes this weekly for 8 weeks then he can get  vitamin D 2000 units over the counter take daily indefinitely. He also needs his vitamin D level checked in three months. He has to make an appointment for this.

## 2017-08-24 ENCOUNTER — Other Ambulatory Visit: Payer: Self-pay | Admitting: Student

## 2017-08-24 DIAGNOSIS — R200 Anesthesia of skin: Secondary | ICD-10-CM

## 2017-09-04 ENCOUNTER — Emergency Department (HOSPITAL_BASED_OUTPATIENT_CLINIC_OR_DEPARTMENT_OTHER)
Admission: EM | Admit: 2017-09-04 | Discharge: 2017-09-04 | Disposition: A | Discharge status: Home / Self Care | Payer: PRIVATE HEALTH INSURANCE | Attending: Emergency Medicine | Admitting: Emergency Medicine

## 2017-09-04 ENCOUNTER — Other Ambulatory Visit: Payer: Self-pay

## 2017-09-04 ENCOUNTER — Encounter (HOSPITAL_BASED_OUTPATIENT_CLINIC_OR_DEPARTMENT_OTHER): Payer: Self-pay

## 2017-09-04 DIAGNOSIS — Z79899 Other long term (current) drug therapy: Secondary | ICD-10-CM | POA: Insufficient documentation

## 2017-09-04 DIAGNOSIS — F17210 Nicotine dependence, cigarettes, uncomplicated: Secondary | ICD-10-CM | POA: Diagnosis not present

## 2017-09-04 DIAGNOSIS — I10 Essential (primary) hypertension: Secondary | ICD-10-CM | POA: Insufficient documentation

## 2017-09-04 DIAGNOSIS — R1084 Generalized abdominal pain: Secondary | ICD-10-CM | POA: Diagnosis present

## 2017-09-04 DIAGNOSIS — G988 Other disorders of nervous system: Secondary | ICD-10-CM | POA: Diagnosis not present

## 2017-09-04 LAB — URINALYSIS, ROUTINE W REFLEX MICROSCOPIC
Bilirubin Urine: NEGATIVE
Glucose, UA: NEGATIVE mg/dL
Ketones, ur: NEGATIVE mg/dL
LEUKOCYTES UA: NEGATIVE
NITRITE: NEGATIVE
PROTEIN: NEGATIVE mg/dL
Specific Gravity, Urine: >1.03 — ABNORMAL HIGH (ref 1.005–1.030)
pH: 5.5 (ref 5.0–8.0)

## 2017-09-04 LAB — URINALYSIS, MICROSCOPIC (REFLEX)

## 2017-09-04 MED ORDER — OXYCODONE-ACETAMINOPHEN 5-325 MG PO TABS: 1.0000 | ORAL_TABLET | ORAL | 0 refills | Status: DC | PRN

## 2017-09-04 NOTE — ED Provider Notes (Signed)
[facility] Provider Note: Georgena Spurling, MD, FACEP  CSN: [id number] MRN: [id number] ARRIVAL: 09/04/17 at [facility]  Fl[facility]   HISTORY OF PRESENT ILLNESS  09/04/17 6:57 AM Julian Lucas is a 42 y.o. male with a several month history of pain in his right flank by which she means his right upper quadrant and right lower rib area.  This is associated with paresthesias in the same area as well as paresthesias into his genitals and lower extremities.  He has had a workup that included a CT of the abdomen and pelvis as well as laboratory studies.  [facility] indicates that the pain pattern seemed to be nondermatomal and there was concern raised for vitamin deficiency or depression manifesting as somatic pain.  He was placed on vitamin B12 and Cymbalta.  He denies feeling depressed and states neither has helped his pain.  He is here today because the pain has worsened.  He rates it as a 9 out of 10 and describes it as a burning pain.  The pain is been severe enough to interfere with sleep.  He has been on chronic tramadol for about the past 5 months, receiving 120 tablets monthly.  These were prescribed principally for chronic knee pain.  He states the tramadol has become less effective and is requesting something stronger.  He has an appointment pending with his PCP next week.  On further questioning about the nature of his symptoms he became skittish and requested that the male nurse leave the room.  He then confessed to me that the numbness was affecting his genitalia and he in fact was impotent with little sensation left in his groin.  He states that he is unable to perform sexually and this is caused significant concern.  He admitted he has been unable to broach the subject with physicians in the past as he felt it was too sensitive the subject to bring up.   He also states that the  symptoms spread into his legs and he feels like that his legs are lying on pins and needles all the time.  He was advised that the workup he has had done, CT scan and lab work, is about all that we can provide in the ED.  He likely needs MRI studies to evaluate for MS or other neurologic lesions.  He was advised to bring this up with his PCP when he meets with her next week.  He was also advised that if he feels uneasy discussing his sexual dysfunction with a male doctor to request to speak with a male colleague.  He was offered a direct referral to neurology but he stated that his insurance would not cover specialty referrals from the ED.  We will defer such referrals to his PCP.  In the meantime we will treat him with a stronger analgesic.  He was advised not to mix this with tramadol.   Past Medical History:  Diagnosis Date  . DEGENERATIVE JOINT DISEASE, LEFT KNEE 09/09/2010   Qualifier: Diagnosis of  By: Oneida Alar MD, KARL    . HERPES GENITALIS 11/21/2009   Qualifier: Diagnosis of  By: Jeannine Kitten MD, Rodman Key    . Hypertension   . PULMONARY EMBOLISM, HX OF 11/21/2009   Qualifier: Diagnosis of  By: Tye Savoy MD, Tommi Rumps    . UNEQUAL LEG LENGTH 08/09/2010   Qualifier: Diagnosis of  By: Ernestina Patches MD, Remo Lipps  Past Surgical History:  Procedure Laterality Date  . KNEE SURGERY    . NECK SURGERY    . TONSILECTOMY, ADENOIDECTOMY, BILATERAL MYRINGOTOMY AND TUBES      Family History  Problem Relation Age of Onset  . Lung cancer Father   . Depression Mother   . Hypertension Mother   . Heart disease Mother     Social History   Tobacco Use  . Smoking status: Current Every Day Smoker    Packs/day: 0.30    Types: Cigarettes  . Smokeless tobacco: Never Used  . Tobacco comment: 6 cigs a day  Substance Use Topics  . Alcohol use: Yes  . Drug use: Yes    Types: Marijuana    Prior to Admission medications   Medication Sig Start Date End Date Taking? Authorizing Provider  DULoxetine (CYMBALTA)  30 MG capsule Take 1-2 capsules (30-60 mg total) by mouth daily. 08/26/17   Everrett Coombe, MD  gabapentin (NEURONTIN) 300 MG capsule Take 1 capsule (300 mg total) by mouth 2 (two) times daily. 07/27/17   Mercy Riding, MD  losartan (COZAAR) 50 MG tablet TAKE ONE TABLET BY MOUTH DAILY 11/12/16   McKeag, Marylynn Pearson, MD  meloxicam (MOBIC) 15 MG tablet Take 1 tablet (15 mg total) by mouth daily. 12/29/16 12/29/17  McKeag, Marylynn Pearson, MD  omeprazole (PRILOSEC) 20 MG capsule Take 1 capsule (20 mg total) by mouth daily. 07/24/17   Rancour, Annie Main, MD  oxyCODONE-acetaminophen (PERCOCET) 5-325 MG tablet Take 1 tablet by mouth every 4 (four) hours as needed for severe pain (for pain; do not take tramadol when taking this medication). 09/04/17   Marylynn Rigdon, Jenny Reichmann, MD  traMADol (ULTRAM) 50 MG tablet Take 2 tablets (100 mg total) by mouth every 12 (twelve) hours as needed for moderate pain. 04/17/17   Sela Hilding, MD  Vitamin D, Ergocalciferol, (DRISDOL) 50000 units CAPS capsule Take 1 capsule (50,000 Units total) by mouth every 7 (seven) days. 07/30/17   Mercy Riding, MD    Allergies Patient has no known allergies.   REVIEW OF SYSTEMS  Negative except as noted here or in the History of Present Illness.   PHYSICAL EXAMINATION  Initial Vital Signs Blood pressure (!) 119/106, pulse 80, temperature 98 F (36.7 C), temperature source Oral, resp. rate 18, height 6\' 3"  (1.905 m), weight (!) 149.7 kg (330 lb), SpO2 100 %.  Examination General: Well-developed, well-nourished male in no acute distress; appearance consistent with age of record HENT: normocephalic; atraumatic Eyes: pupils equal, round and reactive to light; extraocular muscles intact Neck: supple Heart: regular rate and rhythm Lungs: clear to auscultation bilaterally Chest: Right lower anterior ribs superficial tenderness Abdomen: soft; nondistended; right upper quadrant superficial tenderness; no masses or hepatosplenomegaly; bowel sounds  present Extremities: No acute deformity; right ankle in orthotic device; antalgic gait Neurologic: Awake, alert and oriented; motor function grossly intact in all extremities; no facial droop; decreased sensation in right upper quadrant abdomen/right lower chest, decreased sensation in genitalia and lower extremities Skin: Warm and dry Psychiatric: Flat affect   RESULTS  Summary of this visit's results, reviewed by myself:   EKG Interpretation  Date/Time:    Ventricular Rate:    PR Interval:    QRS Duration:   QT Interval:    QTC Calculation:   R Axis:     Text Interpretation:        Laboratory Studies: Results for orders placed or performed during the hospital encounter of 09/04/17 (from the past 24 hour(s))  Urinalysis, Routine w reflex microscopic     Status: Abnormal   Collection Time: 09/04/17  6:07 AM  Result Value Ref Range   Color, Urine YELLOW YELLOW   APPearance CLEAR CLEAR   Specific Gravity, Urine >1.030 (H) 1.005 - 1.030   pH 5.5 5.0 - 8.0   Glucose, UA NEGATIVE NEGATIVE mg/dL   Hgb urine dipstick TRACE (A) NEGATIVE   Bilirubin Urine NEGATIVE NEGATIVE   Ketones, ur NEGATIVE NEGATIVE mg/dL   Protein, ur NEGATIVE NEGATIVE mg/dL   Nitrite NEGATIVE NEGATIVE   Leukocytes, UA NEGATIVE NEGATIVE  Urinalysis, Microscopic (reflex)     Status: Abnormal   Collection Time: 09/04/17  6:07 AM  Result Value Ref Range   RBC / HPF 0-5 0 - 5 RBC/hpf   WBC, UA 0-5 0 - 5 WBC/hpf   Bacteria, UA FEW (A) NONE SEEN   Squamous Epithelial / LPF 0-5 (A) NONE SEEN   Mucus PRESENT    Imaging Studies: No results found.  ED COURSE  Nursing notes and initial vitals signs, including pulse oximetry, reviewed.  Vitals:   09/04/17 0618  BP: (!) 119/106  Pulse: 80  Resp: 18  Temp: 98 F (36.7 C)  TempSrc: Oral  SpO2: 100%  Weight: (!) 149.7 kg (330 lb)  Height: 6\' 3"  (1.905 m)   I am suspicious for a chronic neurologic conditions such as multiple sclerosis.  He was advised of  this and the need for follow-up as scheduled.  The importance of discussing his sexual dysfunction, even if embarrassing, was stressed.  PROCEDURES    ED DIAGNOSES     ICD-10-CM   1. Neurologic disorder G98.8        Shanon Rosser, MD 09/04/17 [phone]

## 2017-09-04 NOTE — ED Triage Notes (Signed)
Pt reports right sided flank pain and "numbness" that radiates into his groin. Denies burning, urgent, or frequent urination. Pt recently seen at [facility] for same.

## 2017-09-04 NOTE — ED Notes (Signed)
ED Provider at bedside. 

## 2017-09-10 ENCOUNTER — Telehealth: Payer: Self-pay

## 2017-09-10 ENCOUNTER — Other Ambulatory Visit: Payer: Self-pay

## 2017-09-10 ENCOUNTER — Ambulatory Visit (INDEPENDENT_AMBULATORY_CARE_PROVIDER_SITE_OTHER): Payer: PRIVATE HEALTH INSURANCE | Admitting: Family Medicine

## 2017-09-10 ENCOUNTER — Encounter: Payer: Self-pay | Admitting: Family Medicine

## 2017-09-10 VITALS — BP 128/78 | HR 71 | Temp 98.4°F | Ht 74.0 in | Wt 335.0 lb

## 2017-09-10 DIAGNOSIS — R29818 Other symptoms and signs involving the nervous system: Secondary | ICD-10-CM

## 2017-09-10 MED ORDER — LORAZEPAM 1 MG PO TABS: 1.0000 mg | ORAL_TABLET | Freq: Once | ORAL | 0 refills | Status: DC | PRN

## 2017-09-10 MED ORDER — LORAZEPAM 1 MG PO TABS: 1.0000 mg | ORAL_TABLET | Freq: Three times a day (TID) | ORAL | 0 refills | Status: DC

## 2017-09-10 NOTE — Patient Instructions (Signed)
It was a pleasure to see you today! Thank you for choosing [facility] for your primary care. Julian Lucas was seen for tingling pain.   Our plans for today were:  Try increasing your gabapentin, add icy hot as needed.   I will call you with MRI results.    Best,  Dr. Lindell Noe

## 2017-09-10 NOTE — Telephone Encounter (Signed)
Patient made aware. Hubbard Hartshorn, RN, BSN

## 2017-09-10 NOTE — Progress Notes (Signed)
CC: R leg numbness, ED f/u  HPI Numbness - previously in left arm, now from R foot to abdomen. Intermittent gabapentin isn't helping 300mg . Has been present in a relapsing pattern x 1 year. Does note that at times he has lack of strength in L hand. Reports that his R leg feels like it will give out at times. No personal history of autoimmune disorders, no family history of MS or other autoimmune disorders. Wife does have MS unfortunately. Also notes erectile dysfunction, which has been present for several months.  Denies bowel incontinence.  Notes urinary incontinence, which she describes as attempting to hold his water and then accidentally emptying a full void.  Additionally, he is concerned with abdominal fullness/Tightness which he associates with the tingling pain that he experiences across the upper half of his abdomen.  Normal bowel movements.  Normal CT abdomen and pelvis on recent ED visit except for hypodense areas within his liver, which were further evaluated on ultrasound and found to be insignificant.    ROS: denies chest pain, shortness of breath, Rash, endorses abdominal pain, extremity numbness and weakness as described above.    CC, SH/smoking status, and VS noted  Objective: BP 128/78   Pulse 71   Temp 98.4 F (36.9 C) (Oral)   Ht 6\' 2"  (1.88 m)   Wt (!) 335 lb (152 kg)   SpO2 97%   BMI 43.01 kg/m  Gen: NAD, alert, cooperative, and pleasant Morbidly obese male. HEENT: NCAT, EOMI, PERRL CV: RRR, no murmur Resp: CTAB, no wheezes, non-labored Abd:  BS present, no guarding or organomegaly.  Exam limited by obesity.  Some fullness over bilateral upper quadrants, with mild associated tenderness to palpation. Ext: No edema, warm Neuro: Alert and oriented, Speech clear, No gross deficits.  Cranial nerves II through XII grossly intact.  Right lower extremity with normal strength, decreased sensation over entire lower extremity.  Assessment and plan:  Multifocal neurological  deficit Due to multiple neurological findings (weakness, numbness, paresthesias) distributed over space and time, and increasing severity of the pattern of illness, concern for multiple sclerosis or other relapsing and remitting neurological disorder.  Will check TSH, HIV, RPR, folate.  Has had vitamin deficiencies checked recently.  Additionally, will order MRI brain and MRI spinal cord.  If all of these are normal, will consider referral to neurology.  In the interim, patient would like to escalate his gabapentin dose and take daily in order to try to relieve the pain from his paresthesias.  Return precautions given to the emergency room or clinic should symptoms acutely worsen.   Orders Placed This Encounter  Procedures  . MR Brain W Wo Contrast    Epic order C/w MS 2/2 multiple lesions in space and time x >6 mos: R arm, L leg, erectile dysfunction. Draw labs @ [address]/no hx of brain sx/no metal in eyes/no bullets/shrapnel/claus - meds/driver/htn/no diab/no liv/kid da/ Ins - [facility]    Standing Status:   Future    Standing Expiration Date:   11/09/2018    Order Specific Question:   If indicated for the ordered procedure, I authorize the administration of contrast media per Radiology protocol    Answer:   Yes    Order Specific Question:   What is the patient's sedation requirement?    Answer:   No Sedation    Order Specific Question:   Does the patient have a pacemaker or implanted devices?  Answer:   No    Order Specific Question:   Radiology Contrast Protocol - do NOT remove file path    Answer:   \\[email or web address]    Order Specific Question:   Preferred imaging location?    Answer:   [facility] (table limit-550lbs)  . MR Cervical Spine Wo Contrast    Epic order [address]/no hx of c-spine sx/no metal in eyes/no bullets/shrapnel/claus - meds/driver/htn/no diab/no liv/kid da/ Ins - [facility]    Standing Status:   Future    Standing Expiration Date:   11/09/2018    Order Specific Question:   What is the patient's sedation requirement?    Answer:   No Sedation    Order Specific Question:   Does the patient have a pacemaker or implanted devices?    Answer:   No    Order Specific Question:   Preferred imaging location?    Answer:   [facility] (table limit-550lbs)    Order Specific Question:   Call Results- Best Contact Number?    Answer:   [id number]=    Order Specific Question:   Radiology Contrast Protocol - do NOT remove file path    Answer:   \\[email or web address]\[email or web address]  . MR Thoracic Spine Wo Contrast    Epic order [address]/hx of t-spine sx - 7 years ago - [facility]/no metal in eyes/no bullets/shrapnel/claus - meds/driver/htn/no diab/no liv/kid da/ Ins - [facility]    Standing Status:   Future    Standing Expiration Date:   11/09/2018    Order Specific Question:   What is the patient's sedation requirement?    Answer:   No Sedation    Order Specific Question:   Does the patient have a pacemaker or implanted devices?    Answer:   No    Order Specific Question:   Preferred imaging location?    Answer:   [facility] (table limit-550lbs)    Order Specific Question:   Call Results- Best Contact Number?    Answer:   [id number]=    Order Specific Question:   Radiology Contrast Protocol - do NOT remove file path    Answer:   \\[email or web address]  . MR Lumbar Spine Wo Contrast    Epic order [address]/no hx of l-spine sx/no metal in eyes/no bullets/shrapnel/claus - meds/driver/htn/no diab/no liv/kid da/ Ins - [facility]    Standing Status:   Future    Standing Expiration Date:   11/09/2018    Order Specific Question:   What is the patient's  sedation requirement?    Answer:   No Sedation    Order Specific Question:   Does the patient have a pacemaker or implanted devices?    Answer:   No    Order Specific Question:   Preferred imaging location?    Answer:   [facility] (table limit-550lbs)    Order Specific Question:   Call Results- Best Contact Number?    Answer:   [id number]=    Order Specific Question:   Radiology Contrast Protocol - do NOT remove file path    Answer:   \\[email or web address].PDF  . TSH    Standing Status:   Future    Standing Expiration Date:   09/11/2018  . HIV antibody    Standing Status:   Future    Standing Expiration Date:   09/11/2018  . RPR    Standing  Status:   Future    Standing Expiration Date:   09/11/2018    Meds ordered this encounter  Medications  . DISCONTD: LORazepam (ATIVAN) 1 MG tablet    Sig: Take 1 tablet (1 mg total) by mouth every 8 (eight) hours. Take one tablet 1 hour before MRI.    Dispense:  2 tablet    Refill:  0  . LORazepam (ATIVAN) 1 MG tablet    Sig: Take 1 tablet (1 mg total) by mouth once as needed for up to 1 dose (once 1 hour before MRI).    Dispense:  2 tablet    Refill:  0    Ralene Ok, MD, PGY2 09/11/2017 6:48 AM

## 2017-09-10 NOTE — Telephone Encounter (Signed)
LVM asking pt to call. If he calls please tell him he needs to be at [facility] at 1:10 on Sat not 2:10. He needs to plan on being there for 2.5-3 hours. Ottis Stain, CMA

## 2017-09-11 ENCOUNTER — Telehealth: Payer: Self-pay | Admitting: Family Medicine

## 2017-09-11 DIAGNOSIS — R202 Paresthesia of skin: Secondary | ICD-10-CM

## 2017-09-11 DIAGNOSIS — R3129 Other microscopic hematuria: Secondary | ICD-10-CM

## 2017-09-11 DIAGNOSIS — R200 Anesthesia of skin: Secondary | ICD-10-CM

## 2017-09-11 DIAGNOSIS — R29818 Other symptoms and signs involving the nervous system: Secondary | ICD-10-CM | POA: Insufficient documentation

## 2017-09-11 NOTE — Telephone Encounter (Signed)
Attempted to call patient x 2 to let him know that I forgot to send him to the lab yesterday to get labs drawn to complete the workup for his weakness and numbness. I want to check for infection (in blood and urine) for thyroid function, and his blood sugar for completeness sake. Please try to call him again. Labs are placed as a future order.

## 2017-09-11 NOTE — Assessment & Plan Note (Signed)
Due to multiple neurological findings (weakness, numbness, paresthesias) distributed over space and time, and increasing severity of the pattern of illness, concern for multiple sclerosis or other relapsing and remitting neurological disorder.  Will check TSH, HIV, RPR, folate.  Has had vitamin deficiencies checked recently.  Additionally, will order MRI brain and MRI spinal cord.  If all of these are normal, will consider referral to neurology.  In the interim, patient would like to escalate his gabapentin dose and take daily in order to try to relieve the pain from his paresthesias.  Return precautions given to the emergency room or clinic should symptoms acutely worsen.

## 2017-09-11 NOTE — Telephone Encounter (Signed)
Called patient. I forgot to order his labs yesterday to look for other etiologies of his neurological symptoms. Placed future orders.

## 2017-09-11 NOTE — Telephone Encounter (Signed)
Spoke to pt. Scheduled him for lab work on 09/14/2017. Ottis Stain, CMA

## 2017-09-14 ENCOUNTER — Other Ambulatory Visit: Payer: PRIVATE HEALTH INSURANCE

## 2017-09-14 DIAGNOSIS — R29818 Other symptoms and signs involving the nervous system: Secondary | ICD-10-CM

## 2017-09-15 LAB — RPR: RPR Ser Ql: NONREACTIVE

## 2017-09-15 LAB — HIV ANTIBODY (ROUTINE TESTING W REFLEX): HIV SCREEN 4TH GENERATION: NONREACTIVE

## 2017-09-15 LAB — TSH: TSH: 0.551 u[IU]/mL (ref 0.450–4.500)

## 2017-09-19 ENCOUNTER — Ambulatory Visit
Admission: RE | Admit: 2017-09-19 | Discharge: 2017-09-19 | Disposition: A | Discharge status: Home / Self Care | Payer: PRIVATE HEALTH INSURANCE | Source: Ambulatory Visit | Attending: Family Medicine | Admitting: Family Medicine

## 2017-09-19 ENCOUNTER — Other Ambulatory Visit: Payer: PRIVATE HEALTH INSURANCE

## 2017-09-19 ENCOUNTER — Other Ambulatory Visit: Payer: Self-pay | Admitting: Family Medicine

## 2017-09-19 DIAGNOSIS — R29818 Other symptoms and signs involving the nervous system: Secondary | ICD-10-CM

## 2017-09-19 MED ORDER — GADOBENATE DIMEGLUMINE 529 MG/ML IV SOLN
20.0000 mL | Freq: Once | INTRAVENOUS | Status: AC | PRN
  Administered 2017-09-19: 20 mL via INTRAVENOUS

## 2017-09-20 ENCOUNTER — Inpatient Hospital Stay: Admission: RE | Admit: 2017-09-20 | Payer: PRIVATE HEALTH INSURANCE | Source: Ambulatory Visit

## 2017-09-22 ENCOUNTER — Telehealth: Payer: Self-pay | Admitting: Family Medicine

## 2017-09-22 NOTE — Telephone Encounter (Addendum)
Reviewed MRI results. precepted with Dr. Andria Frames re cord compression and cord flattening. Called neurosurgeon on call (Dr. Ellene Route), whose office will have him call me back. 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 Kary Kos on 10/31/2009. Patient did not mention to me a history of neck surgery nor similar presenting symptoms at last visit. Will await call from neurosurgeon on how to proceed.   Discussed with Dr. Ellene Route, who recommended urgent appt with himself or Dr. Saintclair Halsted. Gave patient's phone number for office to make an appt.   Called patient to discuss results and need to be seen. He is appropriately disappointed and nervous. He states they made him an appt for next Thursday. Given return precautions to go to ED if any symptoms worsen or change and make sure to tell provider about his MRI findings. He voices understanding.

## 2017-10-10 ENCOUNTER — Emergency Department (HOSPITAL_BASED_OUTPATIENT_CLINIC_OR_DEPARTMENT_OTHER)
Admission: EM | Admit: 2017-10-10 | Discharge: 2017-10-10 | Disposition: A | Discharge status: Home / Self Care | Payer: PRIVATE HEALTH INSURANCE | Attending: Emergency Medicine | Admitting: Emergency Medicine

## 2017-10-10 DIAGNOSIS — F17210 Nicotine dependence, cigarettes, uncomplicated: Secondary | ICD-10-CM | POA: Diagnosis not present

## 2017-10-10 DIAGNOSIS — R531 Weakness: Secondary | ICD-10-CM | POA: Diagnosis present

## 2017-10-10 DIAGNOSIS — Z79899 Other long term (current) drug therapy: Secondary | ICD-10-CM | POA: Diagnosis not present

## 2017-10-10 DIAGNOSIS — I10 Essential (primary) hypertension: Secondary | ICD-10-CM | POA: Insufficient documentation

## 2017-10-10 DIAGNOSIS — M4802 Spinal stenosis, cervical region: Secondary | ICD-10-CM | POA: Diagnosis not present

## 2017-10-10 MED ORDER — CYCLOBENZAPRINE HCL 10 MG PO TABS: 5.0000 mg | ORAL_TABLET | Freq: Every day | ORAL | 0 refills | Status: AC

## 2017-10-10 MED ORDER — METHYLPREDNISOLONE 4 MG PO TBPK: ORAL_TABLET | ORAL | 0 refills | Status: DC

## 2017-10-10 MED ORDER — KETOROLAC TROMETHAMINE 60 MG/2ML IM SOLN
30.0000 mg | Freq: Once | INTRAMUSCULAR | Status: AC
  Administered 2017-10-10: 30 mg via INTRAMUSCULAR
  Filled 2017-10-10: qty 2

## 2017-10-10 MED ORDER — DEXAMETHASONE SODIUM PHOSPHATE 10 MG/ML IJ SOLN
10.0000 mg | Freq: Once | INTRAMUSCULAR | Status: AC
Start: 2017-10-10 — End: 2017-10-10
  Administered 2017-10-10: 10 mg via INTRAMUSCULAR
  Filled 2017-10-10: qty 1

## 2017-10-10 NOTE — ED Notes (Signed)
ED Provider at bedside. 

## 2017-10-10 NOTE — ED Provider Notes (Signed)
[place] EMERGENCY DEPARTMENT Provider Note  CSN: [id number] Arrival date & time: 10/10/17 2458  Chief Complaint(s) Weakness  HPI Julian Lucas is a 42 y.o. male with a history of degenerative disc disease found to have multilevel cervical stenosis on recent MRI by PCP.  Patient was referred to neurosurgery and has an upcoming appointment in 3 days.  Patient reports progression of his right-sided numbness and pain from right midthorax to foot.  He reports that the symptoms have been ongoing for several months and worsening since.  There is no alleviating or aggravating factors.  He denies any trauma.  He does endorse difficulty ambulating due to the pain and numbness in the right ankle.  He has been using his ASO ankle brace.  Denies any urinary or bowel incontinence.  He denies any left-sided symptoms.  No saddle anesthesia.  Denies any other associated physical complaints.  HPI  Past Medical History Past Medical History:  Diagnosis Date  . DEGENERATIVE JOINT DISEASE, LEFT KNEE 09/09/2010   Qualifier: Diagnosis of  By: Oneida Alar MD, KARL    . HERPES GENITALIS 11/21/2009   Qualifier: Diagnosis of  By: Jeannine Kitten MD, Rodman Key    . Hypertension   . PULMONARY EMBOLISM, HX OF 11/21/2009   Qualifier: Diagnosis of  By: Tye Savoy MD, Tommi Rumps    . UNEQUAL LEG LENGTH 08/09/2010   Qualifier: Diagnosis of  By: Ernestina Patches MD, Remo Lipps     Patient Active Problem List   Diagnosis Date Noted  . 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 Right foot pain 11/09/2015  . Pain in joint, ankle and foot 11/09/2015  . Heart burn 11/18/2013  . Dysphagia, unspecified(787.20) 11/18/2013  . Cervical disc disorder with radiculopathy of cervical region 07/27/2013  . Sciatica 01/06/2013  . Chronic pain of multiple joints 09/09/2010  . UNEQUAL LEG LENGTH 08/09/2010  . Depression 04/25/2010  . Genital herpes 11/21/2009  . Severe obesity (BMI >= 40) ([place]) 11/21/2009  . TOBACCO ABUSE 11/21/2009  . HYPERTENSION,  BENIGN ESSENTIAL 11/21/2009   Home Medication(s) Prior to Admission medications   Medication Sig Start Date End Date Taking? Authorizing Provider  cyclobenzaprine (FLEXERIL) 10 MG tablet Take 0.5-1 tablets (5-10 mg total) by mouth at bedtime for 10 days. 10/10/17 10/20/17  Fatima Blank, MD  DULoxetine (CYMBALTA) 30 MG capsule Take 1-2 capsules (30-60 mg total) by mouth daily. 08/26/17   Everrett Coombe, MD  gabapentin (NEURONTIN) 300 MG capsule Take 1 capsule (300 mg total) by mouth 2 (two) times daily. 07/27/17   Mercy Riding, MD  LORazepam (ATIVAN) 1 MG tablet Take 1 tablet (1 mg total) by mouth once as needed for up to 1 dose (once 1 hour before MRI). 09/10/17   Sela Hilding, MD  losartan (COZAAR) 50 MG tablet TAKE ONE TABLET BY MOUTH DAILY 11/12/16   McKeag, Marylynn Pearson, MD  meloxicam (MOBIC) 15 MG tablet Take 1 tablet (15 mg total) by mouth daily. 12/29/16 12/29/17  McKeag, Marylynn Pearson, MD  methylPREDNISolone (MEDROL DOSEPAK) 4 MG TBPK tablet Use as directed on the package 10/10/17   Fatima Blank, MD  omeprazole (PRILOSEC) 20 MG capsule Take 1 capsule (20 mg total) by mouth daily. 07/24/17   Rancour, Annie Main, MD  traMADol (ULTRAM) 50 MG tablet Take 2 tablets (100 mg total) by mouth every 12 (twelve) hours as needed for moderate pain. 04/17/17   Sela Hilding, MD  Vitamin D, Ergocalciferol, (DRISDOL) 50000 units CAPS capsule Take 1 capsule (50,000 Units total) by mouth every 7 (seven)  days. 07/30/17   Mercy Riding, MD                                                                                                                                    Past Surgical History Past Surgical History:  Procedure Laterality Date  . KNEE SURGERY    . NECK SURGERY    . TONSILECTOMY, ADENOIDECTOMY, BILATERAL MYRINGOTOMY AND TUBES     Family History Family History  Problem Relation Age of Onset  . Lung cancer Father   . Depression Mother   . Hypertension Mother   . Heart disease Mother      Social History Social History   Tobacco Use  . Smoking status: Current Every Day Smoker    Packs/day: 0.30    Types: Cigarettes  . Smokeless tobacco: Never Used  . Tobacco comment: 6 cigs a day  Substance Use Topics  . Alcohol use: Yes  . Drug use: Yes    Types: Marijuana   Allergies Patient has no known allergies.  Review of Systems Review of Systems All other systems are reviewed and are negative for acute change except as noted in the HPI  Physical Exam Vital Signs  I have reviewed the triage vital signs BP 133/77 (BP Location: Right Arm)   Pulse 69   Temp 97.7 F (36.5 C) (Oral)   Resp 18   Ht 6\' 2"  (1.88 m)   Wt (!) 149.7 kg (330 lb)   SpO2 97%   BMI 42.37 kg/m   Physical Exam  Constitutional: He is oriented to person, place, and time. He appears well-developed and well-nourished. No distress.  HENT:  Head: Normocephalic and atraumatic.  Right Ear: External ear normal.  Left Ear: External ear normal.  Nose: Nose normal.  Mouth/Throat: Mucous membranes are normal. No trismus in the jaw.  Eyes: Conjunctivae and EOM are normal. No scleral icterus.  Neck: Normal range of motion and phonation normal.  Cardiovascular: Normal rate and regular rhythm.  Pulmonary/Chest: Effort normal. No stridor. No respiratory distress.  Abdominal: He exhibits no distension.  Musculoskeletal: Normal range of motion. He exhibits no edema.  Neurological: He is alert and oriented to person, place, and time.  Spine Exam: Strength: 5 out of 5 strength in left lower extremity.  4+ out of 5 with right hip flexion, knee extension and flexion. (hip flexion/extension, adduction/abduction; knee flexion/extension; 4 out of 5 with foot dorsiflexion/plantarflexion. Sensation: decreased sensation to RLE Reflexes: 1+ quadriceps and achilles reflexes. No clonus  Skin: He is not diaphoretic.  Psychiatric: He has a normal mood and affect. His behavior is normal.  Vitals reviewed.   ED  Results and Treatments Labs (all labs ordered are listed, but only abnormal results are displayed) Labs Reviewed - No data to display  EKG  EKG Interpretation  Date/Time:    Ventricular Rate:    PR Interval:    QRS Duration:   QT Interval:    QTC Calculation:   R Axis:     Text Interpretation:        Radiology No results found. Pertinent labs & imaging results that were available during my care of the patient were reviewed by me and considered in my medical decision making (see chart for details).  Medications Ordered in ED Medications  dexamethasone (DECADRON) injection 10 mg (10 mg Intramuscular Given 10/10/17 0743)  ketorolac (TORADOL) injection 30 mg (30 mg Intramuscular Given 10/10/17 0744)                                                                                                                                    Procedures Procedures  (including critical care time)  Medical Decision Making / ED Course I have reviewed the nursing notes for this encounter and the patient's prior records (if available in EHR or on provided paperwork).    Patient with known multilevel cervical stenosis.  Here with progression of his radiculopathy pain and numbness.  No red flags concerning for cauda equina.  No indication for emergent repeat imaging at this time.  Patient has an upcoming appointment with neurosurgery in 3 days.  We will provide the patient with steroid and Toradol IM injection here in the emergency department and prescribe Medrol Dosepak and muscle relaxer to aid in his symptom management.  The patient appears reasonably screened and/or stabilized for discharge and I doubt any other medical condition or other [facility] requiring further screening, evaluation, or treatment in the ED at this time prior to discharge.  The patient is safe for discharge with strict  return precautions.   Final Clinical Impression(s) / ED Diagnoses Final diagnoses:  Spinal stenosis of cervical region    Disposition: Discharge  Condition: Good  I have discussed the results, Dx and Tx plan with the patient who expressed understanding and agree(s) with the plan. Discharge instructions discussed at great length. The patient was given strict return precautions who verbalized understanding of the instructions. No further questions at time of discharge.    ED Discharge Orders        Ordered    methylPREDNISolone (MEDROL DOSEPAK) 4 MG TBPK tablet     10/10/17 0730    cyclobenzaprine (FLEXERIL) 10 MG tablet  Daily at bedtime     10/10/17 0730       Follow Up: Neurosurgery   as scheduled     This chart was dictated using voice recognition software.  Despite best efforts to proofread,  errors can occur which can change the documentation meaning.    Fatima Blank, MD 10/10/17 [phone]

## 2017-10-10 NOTE — ED Triage Notes (Signed)
Been evaluated previously and got an MRI that showed something was pressing causing the right sided weakness, pain, and paresthesia. Pt has an appointment with Neurosurgery Tuesday but symptoms have gotten worse since dx.

## 2017-10-13 ENCOUNTER — Telehealth: Payer: Self-pay

## 2017-10-13 NOTE — Telephone Encounter (Signed)
Patient left message on nurse line that he would like PCP to call him. Attempted to call patient to find out his need but received an unidentified voicemail. No message left. Danley Danker, RN [facility] RN)

## 2017-10-15 ENCOUNTER — Other Ambulatory Visit: Payer: Self-pay | Admitting: Neurosurgery

## 2017-10-15 DIAGNOSIS — G959 Disease of spinal cord, unspecified: Secondary | ICD-10-CM

## 2017-10-15 NOTE — Telephone Encounter (Signed)
Returned patient's call. He states he saw the neurosurgeon last week, and states it will cost him $22,000 in surgeon's fees alone. He states his insurance will only pay $500. He can't recall why he called me. Gave him information about [facility] financial assistance, states he will google it when he gets off work.

## 2017-10-22 ENCOUNTER — Ambulatory Visit
Admission: RE | Admit: 2017-10-22 | Discharge: 2017-10-22 | Disposition: A | Discharge status: Home / Self Care | Payer: PRIVATE HEALTH INSURANCE | Source: Ambulatory Visit | Attending: Neurosurgery | Admitting: Neurosurgery

## 2017-10-22 DIAGNOSIS — G959 Disease of spinal cord, unspecified: Secondary | ICD-10-CM

## 2017-10-24 ENCOUNTER — Other Ambulatory Visit: Payer: Self-pay | Admitting: Student

## 2017-10-24 DIAGNOSIS — E559 Vitamin D deficiency, unspecified: Secondary | ICD-10-CM

## 2017-10-28 ENCOUNTER — Other Ambulatory Visit: Payer: Self-pay | Admitting: Neurosurgery

## 2017-11-11 ENCOUNTER — Ambulatory Visit: Payer: PRIVATE HEALTH INSURANCE

## 2017-11-13 ENCOUNTER — Emergency Department (HOSPITAL_BASED_OUTPATIENT_CLINIC_OR_DEPARTMENT_OTHER)
Admission: EM | Admit: 2017-11-13 | Discharge: 2017-11-13 | Disposition: A | Discharge status: Home / Self Care | Payer: PRIVATE HEALTH INSURANCE | Attending: Emergency Medicine | Admitting: Emergency Medicine

## 2017-11-13 ENCOUNTER — Encounter (HOSPITAL_COMMUNITY): Payer: Self-pay

## 2017-11-13 ENCOUNTER — Encounter (HOSPITAL_BASED_OUTPATIENT_CLINIC_OR_DEPARTMENT_OTHER): Payer: Self-pay | Admitting: Emergency Medicine

## 2017-11-13 ENCOUNTER — Other Ambulatory Visit: Payer: Self-pay

## 2017-11-13 ENCOUNTER — Emergency Department (HOSPITAL_BASED_OUTPATIENT_CLINIC_OR_DEPARTMENT_OTHER): Payer: PRIVATE HEALTH INSURANCE

## 2017-11-13 DIAGNOSIS — Z79899 Other long term (current) drug therapy: Secondary | ICD-10-CM | POA: Diagnosis not present

## 2017-11-13 DIAGNOSIS — M25571 Pain in right ankle and joints of right foot: Secondary | ICD-10-CM | POA: Diagnosis not present

## 2017-11-13 DIAGNOSIS — I10 Essential (primary) hypertension: Secondary | ICD-10-CM | POA: Insufficient documentation

## 2017-11-13 DIAGNOSIS — Z86718 Personal history of other venous thrombosis and embolism: Secondary | ICD-10-CM | POA: Diagnosis not present

## 2017-11-13 DIAGNOSIS — F17210 Nicotine dependence, cigarettes, uncomplicated: Secondary | ICD-10-CM | POA: Diagnosis not present

## 2017-11-13 HISTORY — DX: Pain in unspecified joint: M25.50

## 2017-11-13 HISTORY — DX: Other chronic pain: G89.29

## 2017-11-13 MED ORDER — DICLOFENAC SODIUM ER 100 MG PO TB24: 100.0000 mg | ORAL_TABLET | Freq: Every day | ORAL | 0 refills | Status: DC

## 2017-11-13 MED ORDER — KETOROLAC TROMETHAMINE 60 MG/2ML IM SOLN
30.0000 mg | Freq: Once | INTRAMUSCULAR | Status: AC
  Administered 2017-11-13: 30 mg via INTRAMUSCULAR
  Filled 2017-11-13: qty 2

## 2017-11-13 NOTE — Pre-Procedure Instructions (Addendum)
Julian Lucas  11/13/2017      [id number] [address], [place] [address] [place] [address] Phone: [phone] Fax: [phone]    Your procedure is scheduled on 11-23-2017  Monday .  Report to [facility] Admitting at 10:00 A.M.   Call this number if you have problems the morning of surgery:  [phone]   Remember:  Do not eat food or drink liquids after midnight.   Take these medicines the morning of surgery with A SIP OF WATER   Gabapentin(neurontin) Omeprazole(Prilosec) Tramadol if needed  STOP TAKING ANY ASPIRIN(UNLESS OTHERWISE INSTRUCTED BY YOUR SURGEON),ANTIINFLAMATORIES (IBUPROFEN,ALEVE,MOTRIN,ADVIL,GOODY'S POWDERS),HERBAL SUPPLEMENTS,FISH OIL,AND VITAMINS 5-7 DAYS PRIOR TO SURGERY      Do not wear jewelry  Do not wear lotions, powders, or perfumes, or deodorant.  Do not shave 48 hours prior to surgery.  Men may shave face and neck.  Do not bring valuables to the hospital.  [facility] is not responsible for any belongings or valuables.  Contacts, dentures or bridgework may not be worn into surgery.  Leave your suitcase in the car.  After surgery it may be brought to your room.  For patients admitted to the hospital, discharge time will be determined by your treatment team.  Patients discharged the day of surgery will not be allowed to drive home.   Special Instructions: [facility] - Preparing for Surgery  Before surgery, you can play an important role.  Because skin is not sterile, your skin needs to be as free of germs as possible.  You can reduce the number of germs on you skin by washing with CHG (chlorahexidine gluconate) soap before surgery.  CHG is an antiseptic cleaner which kills germs and bonds with the skin to continue killing germs even after washing.  Please DO NOT use if you have an allergy to CHG or antibacterial soaps.  If your skin becomes reddened/irritated stop using the CHG and  inform your nurse when you arrive at Short Stay.  Do not shave (including legs and underarms) for at least 48 hours prior to the first CHG shower.  You may shave your face.  Please follow these instructions carefully:   1.  Shower with CHG Soap the night before surgery and the   morning of Surgery.  2.  If you choose to wash your hair, wash your hair first as usual with your normal shampoo.  3.  After you shampoo, rinse your hair and body thoroughly to remove the  Shampoo.  4.  Use CHG as you would any other liquid soap.  You can apply chg directly  to the skin and wash gently with scrungie or a clean washcloth.  5.  Apply the CHG Soap to your body ONLY FROM THE NECK DOWN.   Do not use on open wounds or open sores.  Avoid contact with your eyes,  ears, mouth and genitals (private parts).  Wash genitals (private parts) with your normal soap.  6.  Wash thoroughly, paying special attention to the area where your surgery will be performed.  7.  Thoroughly rinse your body with warm water from the neck down.  8.  DO NOT shower/wash with your normal soap after using and rinsing o  the CHG Soap.  9.  Pat yourself dry with a clean towel.            10.  Wear clean pajamas.  11.  Place clean sheets on your bed the night of your first shower and do not sleep with pets.  Day of Surgery  Do not apply any lotions/deodorants the morning of surgery.  Please wear clean clothes to the hospital/surgery center.   Please read over the following fact sheets that you were given. Pain Booklet, MRSA Information and Surgical Site Infection Prevention

## 2017-11-13 NOTE — ED Triage Notes (Signed)
Pt c/o 8/10 ankle pain that is going on for the past few weeks getting worse today. Pt state he can't hardly walk. Denies any injury.

## 2017-11-13 NOTE — ED Provider Notes (Signed)
[place] EMERGENCY DEPARTMENT Provider Note   CSN: [id number] Arrival date & time: 11/13/17  0551     History   Chief Complaint Chief Complaint  Patient presents with  . Ankle Pain    HPI Cordon Gassett is a 42 y.o. male.  The history is provided by the patient.  Ankle Pain   The incident occurred more than 2 days ago. The incident occurred at home. There was no injury mechanism. The pain is present in the right ankle. The pain is severe. The pain has been constant since onset. Pertinent negatives include no numbness, no loss of motion, no muscle weakness, no loss of sensation and no tingling. He reports no foreign bodies present. Nothing aggravates the symptoms. He has tried NSAIDs for the symptoms. The treatment provided no relief.    Past Medical History:  Diagnosis Date  . Chronic pain of multiple joints   . DEGENERATIVE JOINT DISEASE, LEFT KNEE 09/09/2010   Qualifier: Diagnosis of  By: Oneida Alar MD, KARL    . HERPES GENITALIS 11/21/2009   Qualifier: Diagnosis of  By: Jeannine Kitten MD, Rodman Key    . Hypertension   . PULMONARY EMBOLISM, HX OF 11/21/2009   Qualifier: Diagnosis of  By: Tye Savoy MD, Tommi Rumps    . UNEQUAL LEG LENGTH 08/09/2010   Qualifier: Diagnosis of  By: Ernestina Patches MD, Remo Lipps      Patient Active Problem List   Diagnosis Date Noted  . 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 Right foot pain 11/09/2015  . Pain in joint, ankle and foot 11/09/2015  . Heart burn 11/18/2013  . Dysphagia, unspecified(787.20) 11/18/2013  . Cervical disc disorder with radiculopathy of cervical region 07/27/2013  . Sciatica 01/06/2013  . Chronic pain of multiple joints 09/09/2010  . UNEQUAL LEG LENGTH 08/09/2010  . Depression 04/25/2010  . Genital herpes 11/21/2009  . Severe obesity (BMI >= 40) ([place]) 11/21/2009  . TOBACCO ABUSE 11/21/2009  . HYPERTENSION, BENIGN ESSENTIAL 11/21/2009    Past Surgical History:  Procedure Laterality Date  . KNEE SURGERY    . NECK SURGERY      . TONSILECTOMY, ADENOIDECTOMY, BILATERAL MYRINGOTOMY AND TUBES         Home Medications    Prior to Admission medications   Medication Sig Start Date End Date Taking? Authorizing Provider  DULoxetine (CYMBALTA) 30 MG capsule Take 1-2 capsules (30-60 mg total) by mouth daily. Patient not taking: Reported on 11/05/2017 08/26/17   Everrett Coombe, MD  gabapentin (NEURONTIN) 300 MG capsule Take 1 capsule (300 mg total) by mouth 2 (two) times daily. Patient taking differently: Take 300 mg by mouth 3 (three) times daily.  07/27/17   Mercy Riding, MD  LORazepam (ATIVAN) 1 MG tablet Take 1 tablet (1 mg total) by mouth once as needed for up to 1 dose (once 1 hour before MRI). Patient not taking: Reported on 11/05/2017 09/10/17   Sela Hilding, MD  losartan (COZAAR) 50 MG tablet TAKE ONE TABLET BY MOUTH DAILY 11/12/16   McKeag, Marylynn Pearson, MD  meloxicam (MOBIC) 15 MG tablet Take 1 tablet (15 mg total) by mouth daily. 12/29/16 12/29/17  McKeag, Marylynn Pearson, MD  Menthol, Topical Analgesic, (BIOFREEZE EX) Apply 1 application topically daily as needed (pain).    [provider]  Menthol, Topical Analgesic, (ICY HOT EX) Apply 1 application topically daily as needed (pain).    [provider]  methylPREDNISolone (MEDROL DOSEPAK) 4 MG TBPK tablet Use as directed on the package Patient not taking: Reported on  11/05/2017 10/10/17   Fatima Blank, MD  naproxen sodium (ALEVE) 220 MG tablet Take 220 mg by mouth daily as needed (pain).    [provider]  omeprazole (PRILOSEC) 20 MG capsule Take 1 capsule (20 mg total) by mouth daily. Patient taking differently: Take 20 mg by mouth daily as needed (acid reflux).  07/24/17   Rancour, Annie Main, MD  traMADol (ULTRAM) 50 MG tablet Take 2 tablets (100 mg total) by mouth every 12 (twelve) hours as needed for moderate pain. Patient taking differently: Take 50-100 mg by mouth 3 (three) times daily as needed for moderate pain.  04/17/17   Sela Hilding, MD  Vitamin D, Ergocalciferol, (DRISDOL) 50000 units CAPS capsule TAKE 1 CAPSULE (50,000 UNITS TOTAL) BY MOUTH EVERY 7 (SEVEN) DAYS. 10/27/17   Sela Hilding, MD    Family History Family History  Problem Relation Age of Onset  . Lung cancer Father   . Depression Mother   . Hypertension Mother   . Heart disease Mother     Social History Social History   Tobacco Use  . Smoking status: Current Every Day Smoker    Packs/day: 0.30    Types: Cigarettes  . Smokeless tobacco: Never Used  . Tobacco comment: 6 cigs a day  Substance Use Topics  . Alcohol use: Yes  . Drug use: Yes    Types: Marijuana     Allergies   Patient has no known allergies.   Review of Systems Review of Systems  Constitutional: Negative for fever.  Musculoskeletal: Positive for arthralgias.  Neurological: Negative for tingling, weakness and numbness.  All other systems reviewed and are negative.    Physical Exam Updated Vital Signs Ht 6\' 2"  (1.88 m)   Wt (!) 149.7 kg (330 lb)   BMI 42.37 kg/m   Physical Exam  Constitutional: He is oriented to person, place, and time. He appears well-developed and well-nourished. No distress.  HENT:  Head: Normocephalic and atraumatic.  Mouth/Throat: No oropharyngeal exudate.  Eyes: Pupils are equal, round, and reactive to light. Conjunctivae are normal.  Neck: Normal range of motion. Neck supple.  Cardiovascular: Normal rate, regular rhythm, normal heart sounds and intact distal pulses.  Pulmonary/Chest: Effort normal and breath sounds normal. No stridor. He has no wheezes. He has no rales.  Abdominal: Soft. Bowel sounds are normal. There is no tenderness.  Musculoskeletal: Normal range of motion. He exhibits no edema or deformity.       Right knee: Normal.       Right ankle: He exhibits normal range of motion, no swelling, no ecchymosis, no deformity, no laceration and normal pulse. No tenderness. No lateral malleolus, no medial malleolus, no  AITFL, no CF ligament, no posterior TFL, no head of 5th metatarsal and no proximal fibula tenderness found. Achilles tendon normal.       Right lower leg: Normal.       Right foot: Normal. There is normal range of motion, no tenderness, no bony tenderness, no swelling, normal capillary refill, no crepitus, no deformity and no laceration.  Neurological: He is alert and oriented to person, place, and time. He displays normal reflexes.  Skin: Skin is warm and dry. Capillary refill takes less than 2 seconds.  Psychiatric: He has a normal mood and affect.     ED Treatments / Results  Labs (all labs ordered are listed, but only abnormal results are displayed) Labs Reviewed - No data to display  EKG  EKG Interpretation None  Radiology Dg Ankle 2 Views Right  Result Date: 11/13/2017 CLINICAL DATA:  Right ankle pain for weeks.  No known injury. EXAM: RIGHT ANKLE - 2 VIEW COMPARISON:  Radiograph 11/08/2015 FINDINGS: There is no evidence of fracture, dislocation, or joint effusion. Minimal talonavicular spurring. Small plantar calcaneal spur and Achilles tendon enthesophyte. Soft tissues are unremarkable. IMPRESSION: 1. No acute osseous abnormality. 2. Mild talonavicular spurring. Small plantar calcaneal spur and Achilles tendon enthesophyte. Electronically Signed   By: Jeb Levering M.D.   On: 11/13/2017 06:20    Procedures Procedures (including critical care time)  Medications Ordered in ED Medications  ketorolac (TORADOL) injection 30 mg (30 mg Intramuscular Given 11/13/17 0630)    PMP Aware review 09/25/2017 2 04/17/2017 Tramadol Hcl 50 Mg Tablet 120 30 Mo Con [id number] [place] (0188) 2 20.00 MME Comm Ins [place] 09/16/2017 2 09/10/2017 Lorazepam 1 Mg Tablet 2 2 Mo Con [id number] [place] (0188) 0 1.00 LME Comm Ins [place] 09/04/2017 2 09/04/2017 Oxycodone-Acetaminophen 5-325 30 5 [place] [id number] Nor (0188) 0 45.00 MME Comm Ins [place] 07/26/2017 2 04/17/2017 Tramadol Hcl 50 Mg Tablet 120 30 Mo Con [id number]  [place] (0188) 1 20.00 MME Comm Ins [place] 06/11/2017 2 04/17/2017 Tramadol Hcl 50 Mg Tablet 120 30 Mo Con [id number] [place] (0188) 0 20.00 MME Comm Ins [place] 02/20/2017 1 12/29/2016 Tramadol Hcl 50 Mg Tablet 120 30 Ia Mck [id number] [facility] (1438) 0 20.00 MME Comm Ins [place] 10/22/2016 1 06/23/2016 Tramadol Hcl 50 Mg Tablet 120 30 Mo Con [id number] [facility] (0694) 0 20.00 MME Comm Ins [place] 04/06/2016 1 03/20/2016 Tramadol Hcl 50 Mg Tablet 120 30 Mo Con [id number] [facility] (1438) 0 20.00    Final Clinical Impressions(s) / ED Diagnoses   Return for weakness, numbness, changes in vision or speech, fevers >100.4 unrelieved by medication, shortness of breath, intractable vomiting, or diarrhea, abdominal pain, Inability to tolerate liquids or food, cough, altered mental status or any concerns. No signs of systemic illness or infection. The patient is nontoxic-appearing on exam and vital signs are within normal limits.   I have reviewed the triage vital signs and the nursing notes. Pertinent labs &imaging results that were available during my care of the patient were reviewed by me and considered in my medical decision making (see chart for details).  After history, exam, and medical workup I feel the patient has been appropriately medically screened and is safe for discharge home. Pertinent diagnoses were discussed with the patient. Patient was given return precautions.     Marykatherine Sherwood, MD 11/13/17 [phone]

## 2017-11-16 ENCOUNTER — Other Ambulatory Visit: Payer: Self-pay

## 2017-11-16 ENCOUNTER — Encounter (HOSPITAL_COMMUNITY): Payer: Self-pay

## 2017-11-16 ENCOUNTER — Encounter (HOSPITAL_COMMUNITY)
Admission: RE | Admit: 2017-11-16 | Discharge: 2017-11-16 | Disposition: A | Discharge status: Home / Self Care | Payer: PRIVATE HEALTH INSURANCE | Source: Ambulatory Visit | Attending: Neurosurgery | Admitting: Neurosurgery

## 2017-11-16 DIAGNOSIS — Z01810 Encounter for preprocedural cardiovascular examination: Secondary | ICD-10-CM | POA: Diagnosis present

## 2017-11-16 DIAGNOSIS — Z01812 Encounter for preprocedural laboratory examination: Secondary | ICD-10-CM | POA: Insufficient documentation

## 2017-11-16 DIAGNOSIS — R001 Bradycardia, unspecified: Secondary | ICD-10-CM | POA: Insufficient documentation

## 2017-11-16 HISTORY — DX: Depression, unspecified: F32.A

## 2017-11-16 HISTORY — DX: Anxiety disorder, unspecified: F41.9

## 2017-11-16 HISTORY — DX: Major depressive disorder, single episode, unspecified: F32.9

## 2017-11-16 HISTORY — DX: Gastro-esophageal reflux disease without esophagitis: K21.9

## 2017-11-16 LAB — CBC
HEMATOCRIT: 42.5 % (ref 39.0–52.0)
Hemoglobin: 13.9 g/dL (ref 13.0–17.0)
MCH: 30.7 pg (ref 26.0–34.0)
MCHC: 32.7 g/dL (ref 30.0–36.0)
MCV: 93.8 fL (ref 78.0–100.0)
PLATELETS: 217 10*3/uL (ref 150–400)
RBC: 4.53 MIL/uL (ref 4.22–5.81)
RDW: 12.9 % (ref 11.5–15.5)
WBC: 7.9 10*3/uL (ref 4.0–10.5)

## 2017-11-16 LAB — BASIC METABOLIC PANEL
Anion gap: 9 (ref 5–15)
BUN: 12 mg/dL (ref 6–20)
CHLORIDE: 109 mmol/L (ref 101–111)
CO2: 25 mmol/L (ref 22–32)
Calcium: 9.4 mg/dL (ref 8.9–10.3)
Creatinine, Ser: 0.73 mg/dL (ref 0.61–1.24)
GFR calc Af Amer: >60 mL/min (ref >60)
GFR calc non Af Amer: >60 mL/min (ref >60)
GLUCOSE: 86 mg/dL (ref 65–99)
Potassium: 3.6 mmol/L (ref 3.5–5.1)
SODIUM: 143 mmol/L (ref 135–145)

## 2017-11-16 LAB — SURGICAL PCR SCREEN
MRSA, PCR: NEGATIVE
Staphylococcus aureus: NEGATIVE

## 2017-11-16 NOTE — Progress Notes (Signed)
No history of cardiac problems  Denies any cardiac testing  PCP  Sela Hilding  MD

## 2017-11-20 MED ORDER — CEFAZOLIN SODIUM 10 G IJ SOLR
3.0000 g | INTRAMUSCULAR | Status: AC
  Administered 2017-11-23: 3 g via INTRAVENOUS
  Filled 2017-11-20: qty 3

## 2017-11-23 ENCOUNTER — Encounter (HOSPITAL_COMMUNITY): Payer: Self-pay | Admitting: Anesthesiology

## 2017-11-23 ENCOUNTER — Ambulatory Visit (HOSPITAL_COMMUNITY): Payer: PRIVATE HEALTH INSURANCE | Admitting: Anesthesiology

## 2017-11-23 ENCOUNTER — Ambulatory Visit (HOSPITAL_COMMUNITY)
Admission: RE | Disposition: A | Discharge status: Home / Self Care | Payer: Self-pay | Source: Ambulatory Visit | Attending: Neurosurgery

## 2017-11-23 ENCOUNTER — Ambulatory Visit (HOSPITAL_COMMUNITY)
Admission: RE | Admit: 2017-11-23 | Discharge: 2017-11-24 | Disposition: A | Discharge status: Home / Self Care | Payer: PRIVATE HEALTH INSURANCE | Source: Ambulatory Visit | Attending: Neurosurgery | Admitting: Neurosurgery

## 2017-11-23 ENCOUNTER — Ambulatory Visit (HOSPITAL_COMMUNITY): Payer: PRIVATE HEALTH INSURANCE

## 2017-11-23 ENCOUNTER — Other Ambulatory Visit: Payer: Self-pay | Admitting: Family Medicine

## 2017-11-23 ENCOUNTER — Other Ambulatory Visit: Payer: Self-pay

## 2017-11-23 DIAGNOSIS — F329 Major depressive disorder, single episode, unspecified: Secondary | ICD-10-CM | POA: Insufficient documentation

## 2017-11-23 DIAGNOSIS — K219 Gastro-esophageal reflux disease without esophagitis: Secondary | ICD-10-CM | POA: Diagnosis not present

## 2017-11-23 DIAGNOSIS — Z86711 Personal history of pulmonary embolism: Secondary | ICD-10-CM | POA: Diagnosis not present

## 2017-11-23 DIAGNOSIS — I10 Essential (primary) hypertension: Secondary | ICD-10-CM | POA: Diagnosis not present

## 2017-11-23 DIAGNOSIS — Z8249 Family history of ischemic heart disease and other diseases of the circulatory system: Secondary | ICD-10-CM | POA: Insufficient documentation

## 2017-11-23 DIAGNOSIS — Z801 Family history of malignant neoplasm of trachea, bronchus and lung: Secondary | ICD-10-CM | POA: Insufficient documentation

## 2017-11-23 DIAGNOSIS — F419 Anxiety disorder, unspecified: Secondary | ICD-10-CM | POA: Diagnosis not present

## 2017-11-23 DIAGNOSIS — M5010 Cervical disc disorder with radiculopathy, unspecified cervical region: Secondary | ICD-10-CM

## 2017-11-23 DIAGNOSIS — Z79899 Other long term (current) drug therapy: Secondary | ICD-10-CM | POA: Diagnosis not present

## 2017-11-23 DIAGNOSIS — G959 Disease of spinal cord, unspecified: Secondary | ICD-10-CM | POA: Diagnosis present

## 2017-11-23 DIAGNOSIS — Z818 Family history of other mental and behavioral disorders: Secondary | ICD-10-CM | POA: Insufficient documentation

## 2017-11-23 DIAGNOSIS — E559 Vitamin D deficiency, unspecified: Secondary | ICD-10-CM

## 2017-11-23 DIAGNOSIS — M50023 Cervical disc disorder at C6-C7 level with myelopathy: Secondary | ICD-10-CM | POA: Diagnosis not present

## 2017-11-23 DIAGNOSIS — M4712 Other spondylosis with myelopathy, cervical region: Secondary | ICD-10-CM | POA: Diagnosis not present

## 2017-11-23 DIAGNOSIS — Z6841 Body Mass Index (BMI) 40.0 and over, adult: Secondary | ICD-10-CM | POA: Diagnosis not present

## 2017-11-23 DIAGNOSIS — F17290 Nicotine dependence, other tobacco product, uncomplicated: Secondary | ICD-10-CM | POA: Diagnosis not present

## 2017-11-23 DIAGNOSIS — Z419 Encounter for procedure for purposes other than remedying health state, unspecified: Secondary | ICD-10-CM

## 2017-11-23 HISTORY — PX: ANTERIOR CERVICAL DECOMP/DISCECTOMY FUSION: SHX1161

## 2017-11-23 HISTORY — DX: Cervical disc disorder with myelopathy, unspecified cervical region: M50.00

## 2017-11-23 SURGERY — ANTERIOR CERVICAL DECOMPRESSION/DISCECTOMY FUSION 1 LEVEL
Anesthesia: General | Site: Spine Cervical

## 2017-11-23 MED ORDER — PANTOPRAZOLE SODIUM 40 MG PO TBEC: 80.0000 mg | DELAYED_RELEASE_TABLET | Freq: Every day | ORAL | Status: DC

## 2017-11-23 MED ORDER — SODIUM CHLORIDE 0.9% FLUSH: 3.0000 mL | Freq: Two times a day (BID) | INTRAVENOUS | Status: DC

## 2017-11-23 MED ORDER — FENTANYL CITRATE (PF) 250 MCG/5ML IJ SOLN
INTRAMUSCULAR | Status: DC | PRN
  Administered 2017-11-23: 50 ug via INTRAVENOUS
  Administered 2017-11-23: 100 ug via INTRAVENOUS
  Administered 2017-11-23: 150 ug via INTRAVENOUS
  Administered 2017-11-23: 50 ug via INTRAVENOUS

## 2017-11-23 MED ORDER — NAPROXEN SODIUM 220 MG PO TABS: 220.0000 mg | ORAL_TABLET | Freq: Every day | ORAL | Status: DC | PRN

## 2017-11-23 MED ORDER — PROPOFOL 10 MG/ML IV BOLUS
INTRAVENOUS | Status: AC
  Filled 2017-11-23: qty 40

## 2017-11-23 MED ORDER — MELOXICAM 7.5 MG PO TABS
15.0000 mg | ORAL_TABLET | Freq: Every day | ORAL | Status: DC
  Administered 2017-11-23: 15 mg via ORAL
  Filled 2017-11-23 (×2): qty 2

## 2017-11-23 MED ORDER — DEXAMETHASONE SODIUM PHOSPHATE 10 MG/ML IJ SOLN
10.0000 mg | INTRAMUSCULAR | Status: AC
  Administered 2017-11-23: 10 mg via INTRAVENOUS

## 2017-11-23 MED ORDER — DULOXETINE HCL 30 MG PO CPEP
30.0000 mg | ORAL_CAPSULE | Freq: Every day | ORAL | Status: DC
Start: 2017-11-23 — End: 2017-11-23

## 2017-11-23 MED ORDER — CHLORHEXIDINE GLUCONATE CLOTH 2 % EX PADS: 6.0000 | MEDICATED_PAD | Freq: Once | CUTANEOUS | Status: DC

## 2017-11-23 MED ORDER — DEXAMETHASONE SODIUM PHOSPHATE 10 MG/ML IJ SOLN
INTRAMUSCULAR | Status: AC
  Filled 2017-11-23: qty 1

## 2017-11-23 MED ORDER — ACETAMINOPHEN 325 MG PO TABS
650.0000 mg | ORAL_TABLET | ORAL | Status: DC | PRN
Start: 2017-11-23 — End: 2017-11-24

## 2017-11-23 MED ORDER — 0.9 % SODIUM CHLORIDE (POUR BTL) OPTIME
TOPICAL | Status: DC | PRN
  Administered 2017-11-23: 1000 mL

## 2017-11-23 MED ORDER — SODIUM CHLORIDE 0.9% FLUSH: 3.0000 mL | INTRAVENOUS | Status: DC | PRN

## 2017-11-23 MED ORDER — ACETAMINOPHEN 650 MG RE SUPP: 650.0000 mg | RECTAL | Status: DC | PRN

## 2017-11-23 MED ORDER — HYDROMORPHONE HCL 1 MG/ML IJ SOLN
0.2500 mg | INTRAMUSCULAR | Status: DC | PRN
  Administered 2017-11-23: 0.5 mg via INTRAVENOUS

## 2017-11-23 MED ORDER — OXYCODONE HCL 5 MG PO TABS
10.0000 mg | ORAL_TABLET | ORAL | Status: DC | PRN
  Administered 2017-11-23 – 2017-11-24 (×4): 10 mg via ORAL
  Filled 2017-11-23 (×4): qty 2

## 2017-11-23 MED ORDER — LORAZEPAM 0.5 MG PO TABS: 1.0000 mg | ORAL_TABLET | Freq: Once | ORAL | Status: DC | PRN

## 2017-11-23 MED ORDER — ONDANSETRON HCL 4 MG/2ML IJ SOLN: 4.0000 mg | Freq: Four times a day (QID) | INTRAMUSCULAR | Status: DC | PRN

## 2017-11-23 MED ORDER — MEPERIDINE HCL 50 MG/ML IJ SOLN: 6.2500 mg | INTRAMUSCULAR | Status: DC | PRN

## 2017-11-23 MED ORDER — FENTANYL CITRATE (PF) 250 MCG/5ML IJ SOLN
INTRAMUSCULAR | Status: AC
  Filled 2017-11-23: qty 5

## 2017-11-23 MED ORDER — PANTOPRAZOLE SODIUM 40 MG IV SOLR: 40.0000 mg | Freq: Every day | INTRAVENOUS | Status: DC

## 2017-11-23 MED ORDER — MENTHOL 3 MG MT LOZG: 1.0000 | LOZENGE | OROMUCOSAL | Status: DC | PRN

## 2017-11-23 MED ORDER — HYDROMORPHONE HCL 1 MG/ML IJ SOLN: 0.5000 mg | INTRAMUSCULAR | Status: DC | PRN

## 2017-11-23 MED ORDER — LIDOCAINE 2% (20 MG/ML) 5 ML SYRINGE
INTRAMUSCULAR | Status: DC | PRN
  Administered 2017-11-23: 60 mg via INTRAVENOUS

## 2017-11-23 MED ORDER — THROMBIN 5000 UNITS EX SOLR
CUTANEOUS | Status: DC | PRN
  Administered 2017-11-23 (×2): 5000 [IU] via TOPICAL

## 2017-11-23 MED ORDER — PHENOL 1.4 % MT LIQD: 1.0000 | OROMUCOSAL | Status: DC | PRN

## 2017-11-23 MED ORDER — PANTOPRAZOLE SODIUM 40 MG PO TBEC
40.0000 mg | DELAYED_RELEASE_TABLET | Freq: Every day | ORAL | Status: DC
  Administered 2017-11-23: 40 mg via ORAL
  Filled 2017-11-23: qty 1

## 2017-11-23 MED ORDER — HYDROMORPHONE HCL 1 MG/ML IJ SOLN
INTRAMUSCULAR | Status: AC
  Filled 2017-11-23: qty 1

## 2017-11-23 MED ORDER — CEFAZOLIN SODIUM-DEXTROSE 2-4 GM/100ML-% IV SOLN
2.0000 g | Freq: Three times a day (TID) | INTRAVENOUS | Status: AC
  Administered 2017-11-23 – 2017-11-24 (×2): 2 g via INTRAVENOUS
  Filled 2017-11-23 (×2): qty 100

## 2017-11-23 MED ORDER — ONDANSETRON HCL 4 MG/2ML IJ SOLN: 4.0000 mg | Freq: Once | INTRAMUSCULAR | Status: DC | PRN

## 2017-11-23 MED ORDER — PROPOFOL 10 MG/ML IV BOLUS
INTRAVENOUS | Status: DC | PRN
  Administered 2017-11-23: 50 mg via INTRAVENOUS
  Administered 2017-11-23: 200 mg via INTRAVENOUS

## 2017-11-23 MED ORDER — THROMBIN 5000 UNITS EX SOLR
CUTANEOUS | Status: AC
  Filled 2017-11-23: qty 10000

## 2017-11-23 MED ORDER — DICLOFENAC SODIUM 50 MG PO TBEC
100.0000 mg | DELAYED_RELEASE_TABLET | Freq: Every day | ORAL | Status: DC
  Administered 2017-11-23: 100 mg via ORAL
  Filled 2017-11-23 (×2): qty 2

## 2017-11-23 MED ORDER — ONDANSETRON HCL 4 MG PO TABS: 4.0000 mg | ORAL_TABLET | Freq: Four times a day (QID) | ORAL | Status: DC | PRN

## 2017-11-23 MED ORDER — GABAPENTIN 300 MG PO CAPS
300.0000 mg | ORAL_CAPSULE | Freq: Three times a day (TID) | ORAL | Status: DC
  Administered 2017-11-23 (×2): 300 mg via ORAL
  Filled 2017-11-23 (×2): qty 1

## 2017-11-23 MED ORDER — SODIUM CHLORIDE 0.9 % IR SOLN
Status: DC | PRN
  Administered 2017-11-23: 500 mL

## 2017-11-23 MED ORDER — SODIUM CHLORIDE 0.9 % IV SOLN: 250.0000 mL | INTRAVENOUS | Status: DC

## 2017-11-23 MED ORDER — ONDANSETRON HCL 4 MG/2ML IJ SOLN
INTRAMUSCULAR | Status: DC | PRN
  Administered 2017-11-23: 4 mg via INTRAVENOUS

## 2017-11-23 MED ORDER — MIDAZOLAM HCL 5 MG/5ML IJ SOLN
INTRAMUSCULAR | Status: DC | PRN
  Administered 2017-11-23: 2 mg via INTRAVENOUS

## 2017-11-23 MED ORDER — ROCURONIUM BROMIDE 10 MG/ML (PF) SYRINGE
PREFILLED_SYRINGE | INTRAVENOUS | Status: DC | PRN
  Administered 2017-11-23: 20 mg via INTRAVENOUS
  Administered 2017-11-23: 60 mg via INTRAVENOUS
  Administered 2017-11-23: 20 mg via INTRAVENOUS

## 2017-11-23 MED ORDER — LOSARTAN POTASSIUM 50 MG PO TABS
50.0000 mg | ORAL_TABLET | Freq: Every day | ORAL | Status: DC
  Administered 2017-11-23: 50 mg via ORAL
  Filled 2017-11-23: qty 1

## 2017-11-23 MED ORDER — LACTATED RINGERS IV SOLN
INTRAVENOUS | Status: DC
  Administered 2017-11-23 (×3): via INTRAVENOUS

## 2017-11-23 MED ORDER — HEMOSTATIC AGENTS (NO CHARGE) OPTIME
TOPICAL | Status: DC | PRN
  Administered 2017-11-23: 1 via TOPICAL

## 2017-11-23 MED ORDER — THROMBIN 5000 UNITS EX SOLR
CUTANEOUS | Status: AC
  Filled 2017-11-23: qty 5000

## 2017-11-23 MED ORDER — SUGAMMADEX SODIUM 200 MG/2ML IV SOLN
INTRAVENOUS | Status: DC | PRN
  Administered 2017-11-23: 350 mg via INTRAVENOUS

## 2017-11-23 MED ORDER — CYCLOBENZAPRINE HCL 10 MG PO TABS
10.0000 mg | ORAL_TABLET | Freq: Three times a day (TID) | ORAL | Status: DC | PRN
  Administered 2017-11-23 – 2017-11-24 (×2): 10 mg via ORAL
  Filled 2017-11-23 (×2): qty 1

## 2017-11-23 MED ORDER — THROMBIN (RECOMBINANT) 5000 UNITS EX SOLR
OROMUCOSAL | Status: DC | PRN
  Administered 2017-11-23: 16:00:00 via TOPICAL

## 2017-11-23 MED ORDER — DICLOFENAC SODIUM ER 100 MG PO TB24: 100.0000 mg | ORAL_TABLET | Freq: Every day | ORAL | Status: DC

## 2017-11-23 MED ORDER — VITAMIN D (ERGOCALCIFEROL) 1.25 MG (50000 UNIT) PO CAPS
50000.0000 [IU] | ORAL_CAPSULE | ORAL | Status: DC
Start: 2017-11-23 — End: 2017-11-24
  Administered 2017-11-23: 50000 [IU] via ORAL
  Filled 2017-11-23: qty 1

## 2017-11-23 MED ORDER — MIDAZOLAM HCL 2 MG/2ML IJ SOLN
INTRAMUSCULAR | Status: AC
  Filled 2017-11-23: qty 2

## 2017-11-23 MED ORDER — ALUM & MAG HYDROXIDE-SIMETH 200-200-20 MG/5ML PO SUSP: 30.0000 mL | Freq: Four times a day (QID) | ORAL | Status: DC | PRN

## 2017-11-23 SURGICAL SUPPLY — 63 items
BAG DECANTER FOR FLEXI CONT (MISCELLANEOUS) ×3 IMPLANT
BENZOIN TINCTURE PRP APPL 2/3 (GAUZE/BANDAGES/DRESSINGS) ×3 IMPLANT
BIT DRILL SPINE QC 14 (BIT) ×3 IMPLANT
BUR MATCHSTICK NEURO 3.0 LAGG (BURR) ×3 IMPLANT
CANISTER SUCT 3000ML PPV (MISCELLANEOUS) ×3 IMPLANT
CARTRIDGE OIL MAESTRO DRILL (MISCELLANEOUS) ×1 IMPLANT
CLOSURE WOUND 1/2 X4 (GAUZE/BANDAGES/DRESSINGS) ×1
DERMABOND ADVANCED (GAUZE/BANDAGES/DRESSINGS) ×2
DERMABOND ADVANCED .7 DNX12 (GAUZE/BANDAGES/DRESSINGS) ×1 IMPLANT
DIFFUSER DRILL AIR PNEUMATIC (MISCELLANEOUS) ×3 IMPLANT
DRAPE C-ARM 42X72 X-RAY (DRAPES) ×6 IMPLANT
DRAPE LAPAROTOMY 100X72 PEDS (DRAPES) ×3 IMPLANT
DRAPE MICROSCOPE LEICA (MISCELLANEOUS) ×3 IMPLANT
DRSG OPSITE POSTOP 3X4 (GAUZE/BANDAGES/DRESSINGS) ×3 IMPLANT
DURAPREP 6ML APPLICATOR 50/CS (WOUND CARE) ×3 IMPLANT
ELECT COATED BLADE 2.86 ST (ELECTRODE) ×3 IMPLANT
ELECT REM PT RETURN 9FT ADLT (ELECTROSURGICAL) ×3
ELECTRODE REM PT RTRN 9FT ADLT (ELECTROSURGICAL) ×1 IMPLANT
GAUZE SPONGE 4X4 12PLY STRL (GAUZE/BANDAGES/DRESSINGS) ×3 IMPLANT
GAUZE SPONGE 4X4 16PLY XRAY LF (GAUZE/BANDAGES/DRESSINGS) IMPLANT
GLOVE BIO SURGEON STRL SZ7 (GLOVE) IMPLANT
GLOVE BIO SURGEON STRL SZ8 (GLOVE) ×3 IMPLANT
GLOVE BIOGEL PI IND STRL 7.0 (GLOVE) IMPLANT
GLOVE BIOGEL PI IND STRL 7.5 (GLOVE) ×3 IMPLANT
GLOVE BIOGEL PI IND STRL 8 (GLOVE) ×3 IMPLANT
GLOVE BIOGEL PI INDICATOR 7.0 (GLOVE)
GLOVE BIOGEL PI INDICATOR 7.5 (GLOVE) ×6
GLOVE BIOGEL PI INDICATOR 8 (GLOVE) ×6
GLOVE ECLIPSE 9.0 STRL (GLOVE) ×3 IMPLANT
GLOVE EXAM NITRILE LRG STRL (GLOVE) IMPLANT
GLOVE EXAM NITRILE XL STR (GLOVE) IMPLANT
GLOVE EXAM NITRILE XS STR PU (GLOVE) IMPLANT
GLOVE INDICATOR 8.5 STRL (GLOVE) ×3 IMPLANT
GOWN STRL REUS W/ TWL LRG LVL3 (GOWN DISPOSABLE) ×1 IMPLANT
GOWN STRL REUS W/ TWL XL LVL3 (GOWN DISPOSABLE) ×1 IMPLANT
GOWN STRL REUS W/TWL 2XL LVL3 (GOWN DISPOSABLE) ×3 IMPLANT
GOWN STRL REUS W/TWL LRG LVL3 (GOWN DISPOSABLE) ×2
GOWN STRL REUS W/TWL XL LVL3 (GOWN DISPOSABLE) ×2
HALTER HD/CHIN CERV TRACTION D (MISCELLANEOUS) ×3 IMPLANT
HEMOSTAT POWDER KIT SURGIFOAM (HEMOSTASIS) ×3 IMPLANT
KIT BASIN OR (CUSTOM PROCEDURE TRAY) ×3 IMPLANT
KIT TURNOVER KIT B (KITS) ×3 IMPLANT
NEEDLE HYPO 18GX1.5 BLUNT FILL (NEEDLE) ×3 IMPLANT
NEEDLE SPNL 20GX3.5 QUINCKE YW (NEEDLE) ×3 IMPLANT
NS IRRIG 1000ML POUR BTL (IV SOLUTION) ×3 IMPLANT
OIL CARTRIDGE MAESTRO DRILL (MISCELLANEOUS) ×3
PACK LAMINECTOMY NEURO (CUSTOM PROCEDURE TRAY) ×3 IMPLANT
PAD ARMBOARD 7.5X6 YLW CONV (MISCELLANEOUS) ×9 IMPLANT
PIN DISTRACTION 14MM (PIN) IMPLANT
PLATE ANT CERV XTEND 1 LV 14 (Plate) ×3 IMPLANT
RUBBERBAND STERILE (MISCELLANEOUS) ×6 IMPLANT
SCREW XTD VAR 4.2 SELF TAP (Screw) ×12 IMPLANT
SPACER COLONIAL ACDF TPS 8 LG (Spacer) ×3 IMPLANT
SPONGE INTESTINAL PEANUT (DISPOSABLE) ×3 IMPLANT
SPONGE SURGIFOAM ABS GEL SZ50 (HEMOSTASIS) ×3 IMPLANT
STRIP CLOSURE SKIN 1/2X4 (GAUZE/BANDAGES/DRESSINGS) ×2 IMPLANT
SUT VIC AB 3-0 SH 8-18 (SUTURE) ×3 IMPLANT
SUT VICRYL 4-0 PS2 18IN ABS (SUTURE) ×3 IMPLANT
TAPE CLOTH 4X10 WHT NS (GAUZE/BANDAGES/DRESSINGS) IMPLANT
TOWEL GREEN STERILE (TOWEL DISPOSABLE) ×3 IMPLANT
TOWEL GREEN STERILE FF (TOWEL DISPOSABLE) ×3 IMPLANT
TRAP SPECIMEN MUCOUS 40CC (MISCELLANEOUS) ×3 IMPLANT
WATER STERILE IRR 1000ML POUR (IV SOLUTION) ×3 IMPLANT

## 2017-11-23 NOTE — Op Note (Signed)
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  Surgeon: Dominica Severin Emmagrace Runkel  Asst.: Mallie Mussel pool  Anesthesia: Gen.  EBL: Minimal  History of present illness: A 42 year old gentleman presented with progressive difficulty walking numbness over the right side of his body tingling and weakness and apraxia of both upper extremities. Workup revealed severe cord compression at C6-7 with signal change within his cord. Due to patient's progression of clinical syndrome imaging findings and failure conservative treatment I recommended anterior cervical discectomy and fusion at that level. I extensively went over the risks and benefits of the operation the patient as well as perioperative course expectations of outcome alternatives of surgery he understands and agrees to proceed forward.  Operative procedure: Patient brought into the or was induced under general anesthesia positioned supine the neck in slight extension in 5 pounds of halter traction the right side his neck was prepped and draped in routine sterile fashion. Preoperative x-ray localize the appropriate level so a curvilinear incision was made just off the midline to the anterior border of sternomastoid. The superficial layer of the platysmas dissected out and divided longitudinally the avascular plane to sternomastoid and strap muscles was drilled down to the fascia. Fascia dissected with Kitners. I immediately identified the plate so we identified the disc space 1 below this is a large calcified osteophyte overgrown the space this was removed disc spaces identified the space was cleaned out with upgoing curettes and pituitary rongeurs. Then the  disc space is drilled down under Mike's cup illumination to the posterior angle assessment complex. There was a large herniation migrating subligamentous this is all teased off of the dura the posterior longitudinal ligament was removed piecemeal fashion and aggressive undergoing of both endplates decompress the central canal several large fragments of disc removed from behind the C6 vertebral body. At the end discectomies of further stenosis either centrally or foraminally the thecal sac resumed its normal anatomic position. I then selected an 8 mm parallel cage packed with locally harvested autograft were inserted one 2 mm deep to the anterior vertebral body line. Then I selected a 14 mm globus plate all screws excellent purchase locking mechanisms were engaged. Wounds and to see her get meticulous in space was maintained the wounds closed in layers with interrupted Vicryl in the platysma and a running 4 subcuticular. Dermabond benzo and Steri-Strips and sterile dressing was applied patient recovered in stable condition. At the end the case all needle counts sponge counts were correct.

## 2017-11-23 NOTE — Transfer of Care (Signed)
Immediate Anesthesia Transfer of Care Note  Patient: Julian Lucas  Procedure(s) Performed: ANTERIOR CERVICAL DECOMPRESSION AND FUSION CERVICAL SIX-SEVEN (N/A Spine Cervical)  Patient Location: PACU  Anesthesia Type:General  Level of Consciousness: awake, alert , oriented, drowsy and patient cooperative  Airway & Oxygen Therapy: Patient Spontanous Breathing  Post-op Assessment: Report given to RN and Post -op Vital signs reviewed and stable  Post vital signs: Reviewed and stable  Last Vitals:  Vitals Value Taken Time  BP 121/83 11/23/2017  4:18 PM  Temp    Pulse 63 11/23/2017  4:30 PM  Resp 2 11/23/2017  4:30 PM  SpO2 93 % 11/23/2017  4:30 PM  Vitals shown include unvalidated device data.  Last Pain:  Vitals:   11/23/17 1603  TempSrc:   PainSc: 0-No pain         Complications: No apparent anesthesia complications

## 2017-11-23 NOTE — Anesthesia Procedure Notes (Signed)
Procedure Name: Intubation Date/Time: 11/23/2017 2:05 PM Performed by: Oletta Lamas, CRNA Pre-anesthesia Checklist: Patient identified, Emergency Drugs available, Suction available and Patient being monitored Patient Re-evaluated:Patient Re-evaluated prior to induction Oxygen Delivery Method: Circle System Utilized Preoxygenation: Pre-oxygenation with 100% oxygen Induction Type: IV induction Ventilation: Mask ventilation without difficulty Laryngoscope Size: Mac and 4 Grade View: Grade II Tube type: Oral Tube size: 7.5 mm Number of attempts: 1 Airway Equipment and Method: Stylet Placement Confirmation: ETT inserted through vocal cords under direct vision,  positive ETCO2 and breath sounds checked- equal and bilateral Secured at: 23 cm Tube secured with: Tape Dental Injury: Teeth and Oropharynx as per pre-operative assessment

## 2017-11-23 NOTE — Anesthesia Preprocedure Evaluation (Addendum)
Anesthesia Evaluation  Patient identified by MRN, date of birth, ID band Patient awake    Reviewed: Allergy & Precautions, NPO status , Patient's Chart, lab work & pertinent test results  Airway Mallampati: III  TM Distance: >3 FB Neck ROM: Full    Dental no notable dental hx. (+) Teeth Intact   Pulmonary Current Smoker,    breath sounds clear to auscultation       Cardiovascular hypertension, Pt. on medications Normal cardiovascular exam Rhythm:Regular Rate:Normal  Hx/o PTE   Neuro/Psych PSYCHIATRIC DISORDERS Anxiety Depression  Neuromuscular disease    GI/Hepatic Neg liver ROS, GERD  Medicated and Controlled,  Endo/Other  Morbid obesity  Renal/GU negative Renal ROS  negative genitourinary   Musculoskeletal  (+) Arthritis , Osteoarthritis,  DDD with Myelopathy C6-7   Abdominal (+) + obese,   Peds  Hematology   Anesthesia Other Findings   Reproductive/Obstetrics Hx/o genital herpes                            Anesthesia Physical Anesthesia Plan  ASA: III  Anesthesia Plan: General   Post-op Pain Management:    Induction: Intravenous  PONV Risk Score and Plan: 2 and Ondansetron, Dexamethasone and Treatment may vary due to age or medical condition  Airway Management Planned: Oral ETT  Additional Equipment:   Intra-op Plan:   Post-operative Plan: Extubation in OR  Informed Consent: I have reviewed the patients History and Physical, chart, labs and discussed the procedure including the risks, benefits and alternatives for the proposed anesthesia with the patient or authorized representative who has indicated his/her understanding and acceptance.   Dental advisory given  Plan Discussed with: CRNA, Anesthesiologist and Surgeon  Anesthesia Plan Comments:         Anesthesia Quick Evaluation

## 2017-11-23 NOTE — H&P (Signed)
Julian Lucas is an 42 y.o. male.   Chief Complaint: Neck pain right greater left arm inside pain with numbness in his chest wall and lower abdomen. HPI: 42 year old gentleman presents with numbness tingl weakness in ty walking numbness over the right side his body. Workup revealed a large disc herniation at C6-7 causing severe spinal cord compression and signal change in his cord and stenosis. Due to his progressive conical syndrome imaging findings of a conservative treatment I recommended intracervical discectomy fusion at C6-7.I extensively reviewed the risks and benefits of the operation the patient as well as perioperative course expectations of outcome and alternatives of surgery and he understands and agrees to proceed forward.  Past Medical History:  Diagnosis Date  . Anxiety   . 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 Chronic pain of multiple joints   . DEGENERATIVE JOINT DISEASE, LEFT KNEE 09/09/2010   Qualifier: Diagnosis of  By: Oneida Alar MD, KARL    . Depression   . GERD (gastroesophageal reflux disease)   . HERPES GENITALIS 11/21/2009   Qualifier: Diagnosis of  By: Jeannine Kitten MD, Rodman Key    . Hypertension   . PULMONARY EMBOLISM, HX OF 11/21/2009   Qualifier: Diagnosis of  By: Tye Savoy MD, Tommi Rumps    . UNEQUAL LEG LENGTH 08/09/2010   Qualifier: Diagnosis of  By: Ernestina Patches MD, Remo Lipps      Past Surgical History:  Procedure Laterality Date  . KNEE SURGERY    . NECK SURGERY    . TONSILECTOMY, ADENOIDECTOMY, BILATERAL MYRINGOTOMY AND TUBES      Family History  Problem Relation Age of Onset  . Lung cancer Father   . Depression Mother   . Hypertension Mother   . Heart disease Mother    Social History:  reports that he has been smoking cigars.  He has never used smokeless tobacco. He reports that he drinks alcohol. He reports that he has current or past drug history. Drug: Marijuana.  Allergies: No Known Allergies  Medications Prior to Admission  Medication Sig  Dispense Refill  . gabapentin (NEURONTIN) 300 MG capsule Take 1 capsule (300 mg total) by mouth 2 (two) times daily. (Patient taking differently: Take 300 mg by mouth 3 (three) times daily. ) 180 capsule 3  . losartan (COZAAR) 50 MG tablet TAKE ONE TABLET BY MOUTH DAILY 90 tablet 3  . meloxicam (MOBIC) 15 MG tablet Take 1 tablet (15 mg total) by mouth daily. 30 tablet 3  . Menthol, Topical Analgesic, (BIOFREEZE EX) Apply 1 application topically daily as needed (pain).    . Menthol, Topical Analgesic, (ICY HOT EX) Apply 1 application topically daily as needed (pain).    . naproxen sodium (ALEVE) 220 MG tablet Take 220 mg by mouth daily as needed (pain).    Marland Kitchen omeprazole (PRILOSEC) 20 MG capsule Take 1 capsule (20 mg total) by mouth daily. (Patient taking differently: Take 20 mg by mouth daily as needed (acid reflux). ) 30 capsule 0  . traMADol (ULTRAM) 50 MG tablet Take 2 tablets (100 mg total) by mouth every 12 (twelve) hours as needed for moderate pain. (Patient taking differently: Take 50-100 mg by mouth 3 (three) times daily as needed for moderate pain. ) 120 tablet 2  . Vitamin D, Ergocalciferol, (DRISDOL) 50000 units CAPS capsule TAKE 1 CAPSULE (50,000 UNITS TOTAL) BY MOUTH EVERY 7 (SEVEN) DAYS. 8 capsule 0  . Diclofenac Sodium CR (VOLTAREN-XR) 100 MG 24 hr tablet Take 1 tablet (100 mg total) by mouth  daily. 7 tablet 0  . DULoxetine (CYMBALTA) 30 MG capsule Take 1-2 capsules (30-60 mg total) by mouth daily. (Patient not taking: Reported on 11/05/2017) 60 capsule 0  . LORazepam (ATIVAN) 1 MG tablet Take 1 tablet (1 mg total) by mouth once as needed for up to 1 dose (once 1 hour before MRI). (Patient not taking: Reported on 11/05/2017) 2 tablet 0  . methylPREDNISolone (MEDROL DOSEPAK) 4 MG TBPK tablet Use as directed on the package (Patient not taking: Reported on 11/05/2017) 21 tablet 0    No results found for this or any previous visit (from the past 48 hour(s)). No results found.  Review of  Systems  Musculoskeletal: Positive for neck pain.  Neurological: Positive for tingling, sensory change and focal weakness.    Blood pressure (!) 140/92, pulse 64, temperature 98.8 F (37.1 C), temperature source Oral, resp. rate 18, height 6\' 2"  (1.88 m), weight (!) 154.7 kg (341 lb), SpO2 98 %. Physical Exam  Constitutional: He is oriented to person, place, and time. He appears well-developed.  HENT:  Head: Normocephalic.  Eyes: Pupils are equal, round, and reactive to light.  Neck: Normal range of motion.  Respiratory: Effort normal.  GI: Soft.  Musculoskeletal: Normal range of motion.  Neurological: He is alert and oriented to person, place, and time. He has normal strength. GCS eye subscore is 4. GCS verbal subscore is 5. GCS motor subscore is 6.  Strength is 5 out of 5 deltoid, bicep, tricep, wrist flexion, wrist extension, hand intrinsics.     Assessment/Plan 43 year old gentleman presents for an ACDF at C6-7.  Milinda Sweeney P, MD 11/23/2017, 1:50 PM

## 2017-11-23 NOTE — Plan of Care (Signed)
  Problem: Activity: Goal: Ability to avoid complications of mobility impairment will improve Outcome: Progressing Goal: Ability to tolerate increased activity will improve Outcome: Progressing Goal: Will remain free from falls Outcome: Progressing   Problem: Education: Goal: Ability to verbalize activity precautions or restrictions will improve Outcome: Progressing Goal: Knowledge of the prescribed therapeutic regimen will improve Outcome: Progressing Goal: Understanding of discharge needs will improve Outcome: Progressing   Problem: Physical Regulation: Goal: Ability to maintain clinical measurements within normal limits will improve Outcome: Progressing Goal: Postoperative complications will be avoided or minimized Outcome: Progressing Goal: Diagnostic test results will improve Outcome: Progressing   Problem: Pain Management: Goal: Pain level will decrease Outcome: Progressing   Problem: Health Behavior/Discharge Planning: Goal: Identification of resources available to assist in meeting health care needs will improve Outcome: Progressing   

## 2017-11-24 ENCOUNTER — Other Ambulatory Visit: Payer: Self-pay | Admitting: Neurosurgery

## 2017-11-24 DIAGNOSIS — M50023 Cervical disc disorder at C6-C7 level with myelopathy: Secondary | ICD-10-CM | POA: Diagnosis not present

## 2017-11-24 MED ORDER — CYCLOBENZAPRINE HCL 10 MG PO TABS: 10.0000 mg | ORAL_TABLET | Freq: Three times a day (TID) | ORAL | 0 refills | Status: DC | PRN

## 2017-11-24 MED ORDER — OXYCODONE HCL 10 MG PO TABS: 10.0000 mg | ORAL_TABLET | ORAL | 0 refills | Status: DC | PRN

## 2017-11-24 MED ORDER — GABAPENTIN 300 MG PO CAPS: 300.0000 mg | ORAL_CAPSULE | Freq: Three times a day (TID) | ORAL | 1 refills | Status: DC

## 2017-11-24 MED FILL — Thrombin For Soln 5000 Unit: CUTANEOUS | Qty: 5000 | Status: AC

## 2017-11-24 NOTE — Progress Notes (Signed)
Pt doing well. Pt given D/C instructions with Rx's, verbal understanding was provided. Pt's incision is clean and dry with no sign of infection. Pt's IV was removed prior to D/C. Pt D/C'd home via wheelchair @ 1120 per MD order. Pt is stable @ D/C and has no other needs at this time. Holli Humbles, RN

## 2017-11-24 NOTE — Discharge Instructions (Signed)

## 2017-11-24 NOTE — Anesthesia Postprocedure Evaluation (Addendum)
Anesthesia Post Note  Patient: Julian Lucas  Procedure(s) Performed: ANTERIOR CERVICAL DECOMPRESSION AND FUSION CERVICAL SIX-SEVEN (N/A Spine Cervical)     Patient location during evaluation: PACU Anesthesia Type: General Level of consciousness: awake and alert Pain management: pain level controlled Vital Signs Assessment: post-procedure vital signs reviewed and stable Respiratory status: spontaneous breathing, nonlabored ventilation, respiratory function stable and patient connected to nasal cannula oxygen Cardiovascular status: blood pressure returned to baseline and stable Postop Assessment: no apparent nausea or vomiting Anesthetic complications: no    Last Vitals:  Vitals:   11/24/17 0310 11/24/17 0756  BP: (!) 140/93 105/65  Pulse: 60 (!) 59  Resp: 18 18  Temp: 36.8 C 36.9 C  SpO2: 100% 99%    Last Pain:  Vitals:   11/24/17 0815  TempSrc:   PainSc: 4                  Montez Hageman

## 2017-11-24 NOTE — Discharge Summary (Signed)
  Physician Discharge Summary  Patient ID: Rod Majerus MRN: [id number] DOB/AGE: 11/17/1975 42 y.o. Estimated body mass index is 43.78 kg/m as calculated from the following:   Height as of this encounter: 6\' 2"  (1.88 m).   Weight as of this encounter: 154.7 kg (341 lb).   Admit date: 11/23/2017 Discharge date: 11/24/2017  Admission Diagnoses:cervical spondylitic myelopathy  Discharge Diagnoses: same Active Problems:   Myelopathy [facility])   Discharged Condition: good  Hospital Course: patient admitted hospital underwent to 7 discectomy and fusion at C6-7. Postoperatively patient did very well recovered before the floor was angling and voiding spontaneously tolerating regular diet stable to discharge home.  Consults: Significant Diagnostic Studies: Treatments:ACDF C6-7 Discharge Exam: Blood pressure (!) 140/93, pulse 60, temperature 98.2 F (36.8 C), temperature source Oral, resp. rate 18, height 6\' 2"  (1.88 m), weight (!) 154.7 kg (341 lb), SpO2 100 %. Strength out of 5 wound clean dry and intact  Disposition: home      Signed: Dorothie Wah P 11/24/2017, 7:45 AM

## 2017-11-24 NOTE — Evaluation (Signed)
Physical Therapy Evaluation Patient Details Name: Julian Lucas MRN: [id number] DOB: [birth year] Today's Date: 11/24/2017   History of Present Illness  Pt is a 42 y/o male who presents s/p C6-C7 ACDF on 11/23/17. PMH significant for leg length discrepancy, PE, HTN.  Clinical Impression  Pt admitted with above diagnosis. Pt currently with functional limitations due to the deficits listed below (see PT Problem List). At the time of PT eval pt was able to perform transfers and ambulation with gross min guard assist and RW for support. Pt has 2 flights of stairs to enter his second story apartment. He was able to complete stair training and was educated on precautions, car transfer, and activity progression. Pt will benefit from skilled PT to increase their independence and safety with mobility to allow discharge to the venue listed below.       Follow Up Recommendations No PT follow up;Supervision for mobility/OOB    Equipment Recommendations  Rolling walker with 5" wheels;3in1 (PT)(Bariatric)    Recommendations for Other Services       Precautions / Restrictions Precautions Precautions: Fall;Cervical Precaution Booklet Issued: Yes (comment) Precaution Comments: Reviewed handout with pt. He was cued for precautions during functional mobility.  Required Braces or Orthoses: (No brace needed order) Restrictions Weight Bearing Restrictions: No      Mobility  Bed Mobility Overal bed mobility: Needs Assistance Bed Mobility: Sidelying to Sit   Sidelying to sit: Supervision       General bed mobility comments: Pt already in sidelying upon PT arrival. He was able to transition to sitting EOB without assistance, however VC's provided for safety/maintenance of log roll technique.   Transfers Overall transfer level: Needs assistance Equipment used: Rolling walker (2 wheeled) Transfers: Sit to/from Stand Sit to Stand: Min guard         General transfer comment: Close guard for safety as pt  powered up to full stand. Pt appeared slightly unsteady and reached immediately for the walker.   Ambulation/Gait Ambulation/Gait assistance: Min guard Ambulation Distance (Feet): 300 Feet Assistive device: Rolling walker (2 wheeled);None Gait Pattern/deviations: Step-through pattern;Decreased stride length;Trendelenburg;Antalgic Gait velocity: Decreased Gait velocity interpretation: Below normal speed for age/gender General Gait Details: Antalgic due to leg length discrepancy. Pt was able to ambulate in the room without the RW, however appeared to have trendelenberg gait pattern as well as unsteadiness as pt was reaching for support on walls occasionally. 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 Pt appears much steadiner with the RW. He was cued for improved posture and maintenance of precautions throughout gait training.   Stairs Stairs: Yes Stairs assistance: Min guard Stair Management: One rail Right;Step to pattern;Forwards Number of Stairs: 10 General stair comments: HHA provided on the L however pt did not require assist on the L. He was cued for sequencing and general safety with stair negotiation and completed 1 flight.   Wheelchair Mobility    Modified Rankin (Stroke Patients Only)       Balance Overall balance assessment: Needs assistance Sitting-balance support: Feet supported;No upper extremity supported Sitting balance-Leahy Scale: Good     Standing balance support: No upper extremity supported;During functional activity Standing balance-Leahy Scale: Poor Standing balance comment: Occasionally reaching out for external support                             Pertinent Vitals/Pain Pain Assessment: Faces Faces Pain Scale: Hurts little more Pain Location: Incision site Pain Descriptors / Indicators:  Operative site guarding Pain Intervention(s): Limited activity within patient's tolerance;Monitored during session;Repositioned    Home Living Family/patient  expects to be discharged to:: Private residence Living Arrangements: Spouse/significant other;Children Available Help at Discharge: Family;Available 24 hours/day Type of Home: Apartment Home Access: Stairs to enter Entrance Stairs-Rails: (R first flight, L second flight) Entrance Stairs-Number of Steps: 2 flights Home Layout: One level Home Equipment: None      Prior Function Level of Independence: Independent               Hand Dominance        Extremity/Trunk Assessment   Upper Extremity Assessment Upper Extremity Assessment: RUE deficits/detail;LUE deficits/detail RUE Deficits / Details: Pt reports improvement with strength and sensation in R hand LUE Deficits / Details: Pt reports improvement in sensation, however states he has difficulty with some hand movements on the L due to "arthritis"    Lower Extremity Assessment Lower Extremity Assessment: Generalized weakness(Leg length discrepancy )    Cervical / Trunk Assessment Cervical / Trunk Assessment: Other exceptions Cervical / Trunk Exceptions: s/p surgery  Communication   Communication: No difficulties  Cognition Arousal/Alertness: Awake/alert Behavior During Therapy: WFL for tasks assessed/performed Overall Cognitive Status: Within Functional Limits for tasks assessed                                        General Comments      Exercises     Assessment/Plan    PT Assessment Patient needs continued PT services  PT Problem List Decreased strength;Decreased range of motion;Decreased activity tolerance;Decreased balance;Decreased mobility;Decreased knowledge of use of DME;Decreased safety awareness;Decreased knowledge of precautions;Pain       PT Treatment Interventions DME instruction;Gait training;Stair training;Therapeutic activities;Functional mobility training;Therapeutic exercise;Neuromuscular re-education;Patient/family education    PT Goals (Current goals can be found in the  Care Plan section)  Acute Rehab PT Goals Patient Stated Goal: Home today PT Goal Formulation: With patient Time For Goal Achievement: 12/01/17 Potential to Achieve Goals: Good    Frequency Min 5X/week   Barriers to discharge        Co-evaluation               AM-PAC PT "6 Clicks" Daily Activity  Outcome Measure Difficulty turning over in bed (including adjusting bedclothes, sheets and blankets)?: None Difficulty moving from lying on back to sitting on the side of the bed? : A Little Difficulty sitting down on and standing up from a chair with arms (e.g., wheelchair, bedside commode, etc,.)?: A Little Help needed moving to and from a bed to chair (including a wheelchair)?: A Little Help needed walking in hospital room?: A Little Help needed climbing 3-5 steps with a railing? : A Little 6 Click Score: 19    End of Session Equipment Utilized During Treatment: Gait belt Activity Tolerance: Patient tolerated treatment well Patient left: in chair;with call bell/phone within reach Nurse Communication: Mobility status PT Visit Diagnosis: Unsteadiness on feet (R26.81);Pain;Other abnormalities of gait and mobility (R26.89) Pain - part of body: (neck)    Time: 6433-2951 PT Time Calculation (min) (ACUTE ONLY): 33 min   Charges:   PT Evaluation $PT Eval Moderate Complexity: 1 Mod PT Treatments $Gait Training: 8-22 mins   PT G Codes:        Rolinda Roan, PT, DPT Acute Rehabilitation Services Pager: [phone]   Thelma Comp 11/24/2017, 9:27 AM

## 2017-11-24 NOTE — Evaluation (Signed)
Occupational Therapy Evaluation Patient Details Name: Julian Lucas MRN: [id number] DOB: [birth year] Today's Date: 11/24/2017    History of Present Illness Pt is a 42 y/o male who presents s/p C6-C7 ACDF on 11/23/17. PMH significant for leg length discrepancy, PE, HTN.   Clinical Impression   Patient evaluated by Occupational Therapy with no further acute OT needs identified. All education has been completed and the patient has no further questions. See below for any follow-up Occupational Therapy or equipment needs. OT to sign off. Thank you for referral.      Follow Up Recommendations  No OT follow up    Equipment Recommendations  None recommended by OT    Recommendations for Other Services       Precautions / Restrictions Precautions Precautions: Fall;Cervical Precaution Comments: handout reviewed for precautions and adls Required Braces or Orthoses: (No brace needed order) Restrictions Weight Bearing Restrictions: No      Mobility Bed Mobility               General bed mobility comments: in chair on arrival see PT Mickel Baas notes  Transfers Overall transfer level: Needs assistance Equipment used: Rolling walker (2 wheeled) Transfers: Sit to/from Stand Sit to Stand: Min guard         General transfer comment: pt requires incr time to pwoer up and educated on scoot completely to the edge. educated on elevated surface at home     Balance Overall balance assessment: Needs assistance Sitting-balance support: Feet supported;No upper extremity supported Sitting balance-Leahy Scale: Good     Standing balance support: No upper extremity supported;During functional activity Standing balance-Leahy Scale: Poor Standing balance comment: Occasionally reaching out for external support                           ADL either performed or assessed with clinical judgement   ADL Overall ADL's : Needs assistance/impaired Eating/Feeding: Independent   Grooming:  Independent   Upper Body Bathing: Minimal assistance   Lower Body Bathing: Minimal assistance;With adaptive equipment Lower Body Bathing Details (indicate cue type and reason): educated and return demo with reacher. pt provided a Secondary school teacher for home         Toilet Transfer: Copy Details (indicate cue type and reason): educated and recommended 3n1   Toileting - Clothing Manipulation Details (indicate cue type and reason): educated on toilet tongs Tub/ Shower Transfer: Nurse, learning disability Details (indicate cue type and reason): demo shower transfer and will have 3n1 for home use if required. pt demonstartes ability to complete task without DME   General ADL Comments: pt demonstrates a leg difference with slight sway to ambulation. pt plans to have fiance help with ADls per patient. pt provided reacher to maximize indepdenence  Cervical precautions ( handout provided)educated on oral care using cups, washing face with cloth, never to wash directly on incision site, avoid neck rotation flexion and extension, positioning with pillows in chair for bil UE, sleeping positioning, avoiding pushing / pulling with bil UE, Pt educated on need to notify doctor / RN of swallowing changes or choking..     Vision Baseline Vision/History: Wears glasses Wears Glasses: At all times       Perception     Praxis      Pertinent Vitals/Pain Faces Pain Scale: Hurts little more Pain Location: Incision site Pain Descriptors / Indicators: Operative site guarding Pain Intervention(s): Monitored during session;Premedicated before session;Repositioned     Hand Dominance  Right   Extremity/Trunk Assessment Upper Extremity Assessment Upper Extremity Assessment: Overall WFL for tasks assessed RUE Deficits / Details: Pt reports improvement with strength and sensation in R hand   Lower Extremity Assessment Lower Extremity Assessment: Defer to PT evaluation   Cervical /  Trunk Assessment Cervical / Trunk Assessment: Other exceptions Cervical / Trunk Exceptions: s/p surgery   Communication Communication Communication: No difficulties   Cognition Arousal/Alertness: Awake/alert Behavior During Therapy: WFL for tasks assessed/performed Overall Cognitive Status: Within Functional Limits for tasks assessed                                     General Comments  dressing dry and intact    Exercises     Shoulder Instructions      Home Living Family/patient expects to be discharged to:: Private residence Living Arrangements: Spouse/significant other;Children Available Help at Discharge: Family;Available 24 hours/day Type of Home: Apartment Home Access: Stairs to enter [employer] of Steps: 2 flights Entrance Stairs-Rails: (R first flight, L second flight) Home Layout: One level     Bathroom Shower/Tub: Teacher, early years/pre: Standard     Home Equipment: None   Additional Comments: fiance and 66 year old daughter in the home. pt reports fiance is stay at hoem mother and that he is a home body       Prior Functioning/Environment Level of Independence: Independent                 OT Problem List:        OT Treatment/Interventions:      OT Goals(Current goals can be found in the care plan section) Acute Rehab OT Goals Patient Stated Goal: Home today  OT Frequency:     Barriers to D/C:            Co-evaluation              AM-PAC PT "6 Clicks" Daily Activity     Outcome Measure Help from another person eating meals?: None Help from another person taking care of personal grooming?: None Help from another person toileting, which includes using toliet, bedpan, or urinal?: None Help from another person bathing (including washing, rinsing, drying)?: None Help from another person to put on and taking off regular upper body clothing?: A Little Help from another person to put on and taking  off regular lower body clothing?: None 6 Click Score: 23   End of Session Nurse Communication: Mobility status;Precautions  Activity Tolerance: Patient tolerated treatment well Patient left: in chair;with call bell/phone within reach  OT Visit Diagnosis: Unsteadiness on feet (R26.81)                Time: 1694-5038 OT Time Calculation (min): 35 min Charges:  OT General Charges $OT Visit: 1 Visit OT Evaluation $OT Eval Moderate Complexity: 1 Mod OT Treatments $Self Care/Home Management : 8-22 mins G-Codes:      Jeri Modena   OTR/L Pager: [phone] Office: [phone] .   Parke Poisson B 11/24/2017, 3:09 PM

## 2017-11-24 NOTE — Progress Notes (Signed)
Pt received RW and 3-n-1 from [facility] per MD order. Nazareth Norenberg, RN  

## 2017-11-27 ENCOUNTER — Encounter (HOSPITAL_COMMUNITY): Payer: Self-pay | Admitting: Neurosurgery

## 2017-12-14 ENCOUNTER — Other Ambulatory Visit: Payer: Self-pay | Admitting: Family Medicine

## 2017-12-14 DIAGNOSIS — E559 Vitamin D deficiency, unspecified: Secondary | ICD-10-CM

## 2017-12-21 ENCOUNTER — Emergency Department (HOSPITAL_COMMUNITY)
Admission: EM | Admit: 2017-12-21 | Discharge: 2017-12-21 | Disposition: A | Discharge status: Home / Self Care | Payer: Medicaid Other | Attending: Emergency Medicine | Admitting: Emergency Medicine

## 2017-12-21 ENCOUNTER — Encounter (HOSPITAL_COMMUNITY): Payer: Self-pay | Admitting: Emergency Medicine

## 2017-12-21 DIAGNOSIS — Z5321 Procedure and treatment not carried out due to patient leaving prior to being seen by health care provider: Secondary | ICD-10-CM | POA: Insufficient documentation

## 2017-12-21 DIAGNOSIS — R1013 Epigastric pain: Secondary | ICD-10-CM

## 2017-12-21 DIAGNOSIS — R109 Unspecified abdominal pain: Secondary | ICD-10-CM | POA: Diagnosis present

## 2017-12-21 LAB — COMPREHENSIVE METABOLIC PANEL
ALBUMIN: 3.8 g/dL (ref 3.5–5.0)
ALK PHOS: 74 U/L (ref 38–126)
ALT: 23 U/L (ref 17–63)
ANION GAP: 7 (ref 5–15)
AST: 19 U/L (ref 15–41)
BILIRUBIN TOTAL: 0.8 mg/dL (ref 0.3–1.2)
BUN: 18 mg/dL (ref 6–20)
CALCIUM: 9.6 mg/dL (ref 8.9–10.3)
CO2: 26 mmol/L (ref 22–32)
CREATININE: 0.78 mg/dL (ref 0.61–1.24)
Chloride: 107 mmol/L (ref 101–111)
GFR calc Af Amer: >60 mL/min (ref >60)
GFR calc non Af Amer: >60 mL/min (ref >60)
GLUCOSE: 88 mg/dL (ref 65–99)
Potassium: 4 mmol/L (ref 3.5–5.1)
SODIUM: 140 mmol/L (ref 135–145)
TOTAL PROTEIN: 7.4 g/dL (ref 6.5–8.1)

## 2017-12-21 LAB — CBC WITH DIFFERENTIAL/PLATELET
BASOS PCT: 0 %
Basophils Absolute: 0 10*3/uL (ref 0.0–0.1)
EOS ABS: 0.2 10*3/uL (ref 0.0–0.7)
EOS PCT: 3 %
HCT: 43.5 % (ref 39.0–52.0)
HEMOGLOBIN: 14.3 g/dL (ref 13.0–17.0)
LYMPHS ABS: 2.6 10*3/uL (ref 0.7–4.0)
Lymphocytes Relative: 51 %
MCH: 30.8 pg (ref 26.0–34.0)
MCHC: 32.9 g/dL (ref 30.0–36.0)
MCV: 93.5 fL (ref 78.0–100.0)
MONO ABS: 0.5 10*3/uL (ref 0.1–1.0)
MONOS PCT: 10 %
Neutro Abs: 1.8 10*3/uL (ref 1.7–7.7)
Neutrophils Relative %: 36 %
Platelets: 209 10*3/uL (ref 150–400)
RBC: 4.65 MIL/uL (ref 4.22–5.81)
RDW: 12.7 % (ref 11.5–15.5)
WBC: 5.1 10*3/uL (ref 4.0–10.5)

## 2017-12-21 LAB — LIPASE, BLOOD: Lipase: 30 U/L (ref 11–51)

## 2017-12-21 NOTE — ED Notes (Signed)
Last call for pt to recheck vitals, no response.

## 2017-12-21 NOTE — ED Notes (Signed)
Called pt to recheck vitals. No response.  

## 2017-12-21 NOTE — ED Notes (Signed)
Called Pt 3x to recheck vitals. No response.

## 2017-12-21 NOTE — ED Triage Notes (Signed)
Patient to ED c/o pain to both sides - states this is not a new issue and his MD was supposed to be scheduling MRI for him. Patient had cervical disc surgery 11/23/17 and reports his lower back pain got worse. Patient states the pain in his sides goes around his abdomen and is worse in the R flank. Endorses urinary frequency, but denies other symptoms, denies numbness/tingling. Ambulatory with steady gait. Denies N/V/D. Last BM today.

## 2017-12-21 NOTE — ED Provider Notes (Signed)
Patient placed in Quick Look pathway, seen and evaluated   Chief Complaint: Bilateral abdominal pain  HPI:   Patient reports that since his cervical disc surgery on 11/23/2017 he has been having lower back pain.  He points to the area around his mid abdomen bilaterally, states it is worse on the same side.  He denies any changes to bowel or bladder function, no numbness or tingling across upper inner thighs or genitals.    ROS: No fevers (one)  Physical Exam:   Gen: No distress  Neuro: Awake and Alert  Skin: Warm    Focused Exam: Patient has diffuse tenderness to very light palpation over the bilateral flanks, mid/lower back, and abdomen.     Initiation of care has begun. The patient has been counseled on the process, plan, and necessity for staying for the completion/evaluation, and the remainder of the medical screening examination    Lorin Glass, Hershal Coria 12/21/17 1804

## 2017-12-23 ENCOUNTER — Other Ambulatory Visit: Payer: Self-pay | Admitting: Neurosurgery

## 2017-12-23 DIAGNOSIS — M5414 Radiculopathy, thoracic region: Secondary | ICD-10-CM

## 2017-12-25 ENCOUNTER — Other Ambulatory Visit: Payer: Self-pay

## 2017-12-25 MED ORDER — LORAZEPAM 1 MG PO TABS: 1.0000 mg | ORAL_TABLET | Freq: Once | ORAL | 0 refills | Status: DC | PRN

## 2017-12-25 NOTE — Addendum Note (Signed)
Addended by: Glenis Smoker on: 12/25/2017 03:32 PM   Modules accepted: Orders

## 2017-12-25 NOTE — Telephone Encounter (Signed)
Pt is scheduled for another MRI 12/31/17 and is requesting refill of ativan to take prior to MRI. [facility]. Pt call back [phone] Wallace Cullens, RN

## 2017-12-31 ENCOUNTER — Ambulatory Visit
Admission: RE | Admit: 2017-12-31 | Discharge: 2017-12-31 | Disposition: A | Discharge status: Home / Self Care | Payer: PRIVATE HEALTH INSURANCE | Source: Ambulatory Visit | Attending: Neurosurgery | Admitting: Neurosurgery

## 2017-12-31 DIAGNOSIS — M5414 Radiculopathy, thoracic region: Secondary | ICD-10-CM

## 2018-01-22 ENCOUNTER — Other Ambulatory Visit: Payer: Self-pay | Admitting: Family Medicine

## 2018-01-25 ENCOUNTER — Telehealth: Payer: Self-pay

## 2018-01-25 NOTE — Telephone Encounter (Signed)
Pt request refill of Mobic. [facility] on [address] sending request. Ottis Stain, [place]

## 2018-01-27 ENCOUNTER — Ambulatory Visit (INDEPENDENT_AMBULATORY_CARE_PROVIDER_SITE_OTHER): Payer: Self-pay | Admitting: Family Medicine

## 2018-01-27 ENCOUNTER — Encounter: Payer: Self-pay | Admitting: Family Medicine

## 2018-01-27 ENCOUNTER — Other Ambulatory Visit: Payer: Self-pay

## 2018-01-27 VITALS — BP 110/75 | HR 86 | Temp 98.8°F | Wt 345.8 lb

## 2018-01-27 DIAGNOSIS — R200 Anesthesia of skin: Secondary | ICD-10-CM

## 2018-01-27 DIAGNOSIS — Z8659 Personal history of other mental and behavioral disorders: Secondary | ICD-10-CM

## 2018-01-27 DIAGNOSIS — M5010 Cervical disc disorder with radiculopathy, unspecified cervical region: Secondary | ICD-10-CM

## 2018-01-27 DIAGNOSIS — F329 Major depressive disorder, single episode, unspecified: Secondary | ICD-10-CM

## 2018-01-27 DIAGNOSIS — F32A Depression, unspecified: Secondary | ICD-10-CM

## 2018-01-27 DIAGNOSIS — I10 Essential (primary) hypertension: Secondary | ICD-10-CM

## 2018-01-27 DIAGNOSIS — R3129 Other microscopic hematuria: Secondary | ICD-10-CM

## 2018-01-27 LAB — POCT URINALYSIS DIP (MANUAL ENTRY)
Glucose, UA: NEGATIVE mg/dL
Leukocytes, UA: NEGATIVE
Nitrite, UA: NEGATIVE
PH UA: 5.5 (ref 5.0–8.0)
Protein Ur, POC: 30 mg/dL — AB
Spec Grav, UA: >=1.03 — AB (ref 1.010–1.025)
Urobilinogen, UA: 1 E.U./dL

## 2018-01-27 LAB — POCT UA - MICROSCOPIC ONLY

## 2018-01-27 MED ORDER — GABAPENTIN 300 MG PO CAPS: 300.0000 mg | ORAL_CAPSULE | Freq: Three times a day (TID) | ORAL | 3 refills | Status: DC

## 2018-01-27 MED ORDER — CYCLOBENZAPRINE HCL 10 MG PO TABS: 10.0000 mg | ORAL_TABLET | Freq: Every evening | ORAL | 0 refills | Status: DC | PRN

## 2018-01-27 MED ORDER — LOSARTAN POTASSIUM 25 MG PO TABS: 25.0000 mg | ORAL_TABLET | Freq: Every day | ORAL | 1 refills | Status: DC

## 2018-01-27 NOTE — Progress Notes (Signed)
CC: abdominal pain, hand spasms  HPI  Hx of hematuria - denies blood in his urine. UA with +Hgb on 07/24/17, repeat on 09/04/17 with trace Hgb and no RBCs.   L hand - "no strength in it" since after the surgery. Dr. Saintclair Halsted told him there may have been permanent nerve damage. "sticks" in flexion for a minute. All day long.   Not taking pain medicine any more. Bilateral abdominal pain feels like a pulling rubberband. Worse after the surgery. Dr. Saintclair Halsted says everything is fine. All the time, no change with movements. Keeps him up at night. Helped a little by narcotics (BID), but these mostly made him sleepy.   Has appt with Dr. Saintclair Halsted next week.   HTN - on losartan, last time he took this was 4-5 days ago.   Mood - states he was diagnosed with bipolar in 2010. Wants to see psych.   ROS: Denies CP, SOB, + abdominal pain, denies dysuria, changes in BMs.   CC, SH/smoking status, and VS noted  Objective: BP 110/75 (BP Location: Right Arm, Patient Position: Sitting, Cuff Size: Large)   Pulse 86   Temp 98.8 F (37.1 C) (Oral)   Wt (!) 345 lb 12.8 oz (156.9 kg)   SpO2 97%   BMI 44.40 kg/m  Gen: NAD, alert, cooperative, and pleasant. HEENT: NCAT, EOMI, PERRL CV: RRR, no murmur Resp: CTAB, no wheezes, non-labored Abd: soft, protuberant, TTP over right UQ, BS present, no guarding or organomegaly Ext: No edema, warm. 4/5 grip strength in bilateral hands, normal sensation.  Neuro: Alert and oriented, Speech clear, No gross deficits  Assessment and plan:  HYPERTENSION, BENIGN ESSENTIAL Refilled losartan today.   Cervical disc disorder with radiculopathy of cervical region Has follow up with Dr. Saintclair Halsted next week, unclear whether hand cramping is related to postsurgical changes.  Patient says Dr. Phillip Heal reviewed imaging and sees no etiology for this.  Will increase nightly gabapentin dose in order to possibly help with muscle tightness.  Depression Patient states hx of bipolar, wants to see a  psychiatrist. States he gets easily angry, but denies manic symptoms. Placed psych referral.   Abdominal pain: unclear etiology. Reviewed multiple imaging studies including right upper quadrant ultrasound, CT abdomen and pelvis.  No clear etiology to suggest right upper quadrant pain.  We will continue to monitor this and recheck.   Orders Placed This Encounter  Procedures  . Ambulatory referral to Psychiatry    Referral Priority:   Routine    Referral Type:   Psychiatric    Referral Reason:   Specialty Services Required    Requested Specialty:   Psychiatry    Number of Visits Requested:   1  . POCT urinalysis dipstick  . POCT UA - Microscopic Only    Meds ordered this encounter  Medications  . losartan (COZAAR) 25 MG tablet    Sig: Take 1 tablet (25 mg total) by mouth daily.    Dispense:  90 tablet    Refill:  1  . cyclobenzaprine (FLEXERIL) 10 MG tablet    Sig: Take 1 tablet (10 mg total) by mouth at bedtime as needed for muscle spasms.    Dispense:  30 tablet    Refill:  0  . gabapentin (NEURONTIN) 300 MG capsule    Sig: Take 1 capsule (300 mg total) by mouth 3 (three) times daily. Take 1 capsule morning and afternoon and 2 capsules before bed.    Dispense:  180 capsule  Refill:  3    Ralene Ok, MD, PGY2 02/02/2018 12:40 PM

## 2018-01-27 NOTE — Telephone Encounter (Signed)
Patient has an appt with me today, will discuss this. I haven't been prescribing his mobic.

## 2018-01-27 NOTE — Patient Instructions (Addendum)
Call the Behavioral Medicine center to schedule an appointment. Their phone number is: [phone].  It was a pleasure to see you today! Thank you for choosing [facility] for your primary care. Julian Lucas was seen for abdominal pain, hand pain.   Our plans for today were:  Increase your gabapentin to 600mg  before bed and 300mg  morning and afternoon.   Take the flexeril if you feel the muscle spasms are not controlled.   Ask Dr. Saintclair Halsted about the paperwork.   You should return to our clinic to see Dr. Lindell Noe in 3 months for recheck abdominal pain.   Best,  Dr. Lindell Noe

## 2018-01-28 ENCOUNTER — Other Ambulatory Visit: Payer: Self-pay | Admitting: Family Medicine

## 2018-02-02 NOTE — Assessment & Plan Note (Signed)
Patient states hx of bipolar, wants to see a psychiatrist. States he gets easily angry, but denies manic symptoms. Placed psych referral.

## 2018-02-02 NOTE — Assessment & Plan Note (Signed)
Refilled losartan today.

## 2018-02-02 NOTE — Assessment & Plan Note (Signed)
Has follow up with Dr. Saintclair Halsted next week, unclear whether hand cramping is related to postsurgical changes.  Patient says Dr. Phillip Heal reviewed imaging and sees no etiology for this.  Will increase nightly gabapentin dose in order to possibly help with muscle tightness.

## 2018-02-06 ENCOUNTER — Other Ambulatory Visit: Payer: Self-pay | Admitting: Neurosurgery

## 2018-02-06 DIAGNOSIS — G959 Disease of spinal cord, unspecified: Secondary | ICD-10-CM

## 2018-02-08 ENCOUNTER — Encounter: Payer: Self-pay | Admitting: Family Medicine

## 2018-02-08 ENCOUNTER — Other Ambulatory Visit: Payer: Self-pay

## 2018-02-08 ENCOUNTER — Ambulatory Visit (INDEPENDENT_AMBULATORY_CARE_PROVIDER_SITE_OTHER): Payer: PRIVATE HEALTH INSURANCE | Admitting: Family Medicine

## 2018-02-08 DIAGNOSIS — R1011 Right upper quadrant pain: Secondary | ICD-10-CM | POA: Diagnosis not present

## 2018-02-08 DIAGNOSIS — F17200 Nicotine dependence, unspecified, uncomplicated: Secondary | ICD-10-CM

## 2018-02-08 DIAGNOSIS — M5010 Cervical disc disorder with radiculopathy, unspecified cervical region: Secondary | ICD-10-CM

## 2018-02-08 LAB — POCT URINALYSIS DIP (MANUAL ENTRY)
BILIRUBIN UA: NEGATIVE mg/dL
Bilirubin, UA: NEGATIVE
Blood, UA: NEGATIVE
Glucose, UA: NEGATIVE mg/dL
LEUKOCYTES UA: NEGATIVE
NITRITE UA: NEGATIVE
PH UA: 5.5 (ref 5.0–8.0)
Protein Ur, POC: NEGATIVE mg/dL
Spec Grav, UA: 1.025 (ref 1.010–1.025)
Urobilinogen, UA: 0.2 E.U./dL

## 2018-02-08 NOTE — Progress Notes (Signed)
di

## 2018-02-08 NOTE — Progress Notes (Cosign Needed)
   CC: abnormal UA, hx of neck surgery   HPI  Abnormal UA - no urinary symptoms, no frank hematuria.   Hx of cervical cord compression -saw Dr. Saintclair Halsted last week, given persistent left hand cramping and pain, Dr. Saintclair Halsted is going to proceed with another MRI of his neck to assess his C3-4 distribution.  Abdominal pain: Relatively unchanged since last visit, notes he has hard painful stools every other day.  He used to use MiraLAX several years ago.  States raisin bran really helps with his regularity of bowel movements.    Tobacco abuse - daily smoking THC. States this helps with his pain.  ROS: Denies CP, SOB, abdominal pain, dysuria, changes in BMs.   CC, SH/smoking status, and VS noted  Objective: BP 130/88   Pulse 75   Temp 98.2 F (36.8 C) (Oral)   Ht 6\' 2"  (1.88 m)   Wt (!) 352 lb 6.4 oz (159.8 kg)   SpO2 97%   BMI 45.25 kg/m  Gen: NAD, alert, cooperative, and pleasant. HEENT: NCAT, EOMI, PERRL CV: RRR, no murmur Resp: CTAB, no wheezes, non-labored Abd: soft, protuberant, tender to deep palpation over R lateral abdomen. Normal bowel sounds. Ext: No edema, warm Neuro: Alert and oriented, Speech clear, No gross deficits  Assessment and plan:  Cervical disc disorder with radiculopathy of cervical region Received fax from [facility] with Dr. Saintclair Halsted on 02/04/18 - Dr. Saintclair Halsted c/w possible additional cervical disc rupture or progression of his c3-4 disease - MRI ordered, scheduled for 6/26. Plan to return to Dr. Saintclair Halsted after this MRI has resulted.   Abdominal pain, right upper quadrant Persistent, despite multiple imaging. Constipation may be contributing, asked patient to use miralax and dietary fiber daily to titrate to soft daily BMs.   TOBACCO ABUSE Persistent daily use of THC, question whether this is worsening his chronic abdominal pain. Asked him to decrease as able.   Abnormal UA - reassured that UA today after increased hydration is normal.   Orders Placed This Encounter    Procedures  . POCT urinalysis dipstick    No orders of the defined types were placed in this encounter.   Ralene Ok, MD, PGY2 02/09/2018 2:10 PM

## 2018-02-08 NOTE — Patient Instructions (Signed)
It was a pleasure to see you today! Thank you for choosing [facility] for your primary care. Julian Lucas was seen for urine test (normal), abdominal pain, hand cramping.   Our plans for today were:  I'm glad Dr. Saintclair Halsted is getting the MRI, and we will await the results.   For your abdominal pain, please use Miralax daily and/or raisin bran until you have soft daily bowel movements.   Please try to decrease your smoking.    Best,  Dr. Lindell Noe

## 2018-02-08 NOTE — Assessment & Plan Note (Signed)
Received fax from [facility] with Dr. Saintclair Halsted on 02/04/18 - Dr. Saintclair Halsted c/w possible additional cervical disc rupture or progression of his c3-4 disease - MRI ordered, scheduled for 6/26. Plan to return to Dr. Saintclair Halsted after this MRI has resulted.

## 2018-02-09 DIAGNOSIS — R1011 Right upper quadrant pain: Secondary | ICD-10-CM | POA: Insufficient documentation

## 2018-02-09 NOTE — Assessment & Plan Note (Signed)
Persistent, despite multiple imaging. Constipation may be contributing, asked patient to use miralax and dietary fiber daily to titrate to soft daily BMs.

## 2018-02-09 NOTE — Assessment & Plan Note (Signed)
Persistent daily use of THC, question whether this is worsening his chronic abdominal pain. Asked him to decrease as able.

## 2018-02-17 ENCOUNTER — Ambulatory Visit
Admission: RE | Admit: 2018-02-17 | Discharge: 2018-02-17 | Disposition: A | Discharge status: Home / Self Care | Payer: PRIVATE HEALTH INSURANCE | Source: Ambulatory Visit | Attending: Neurosurgery | Admitting: Neurosurgery

## 2018-02-17 ENCOUNTER — Inpatient Hospital Stay
Admission: RE | Admit: 2018-02-17 | Discharge: 2018-02-17 | Disposition: A | Discharge status: Home / Self Care | Payer: PRIVATE HEALTH INSURANCE | Source: Ambulatory Visit | Attending: Neurosurgery | Admitting: Neurosurgery

## 2018-02-17 DIAGNOSIS — G959 Disease of spinal cord, unspecified: Secondary | ICD-10-CM

## 2018-02-26 ENCOUNTER — Other Ambulatory Visit: Payer: Self-pay | Admitting: Family Medicine

## 2018-02-26 ENCOUNTER — Ambulatory Visit (INDEPENDENT_AMBULATORY_CARE_PROVIDER_SITE_OTHER): Payer: Medicaid Other | Admitting: Family Medicine

## 2018-02-26 ENCOUNTER — Ambulatory Visit
Admission: RE | Admit: 2018-02-26 | Discharge: 2018-02-26 | Disposition: A | Discharge status: Home / Self Care | Payer: Medicaid Other | Source: Ambulatory Visit | Attending: Family Medicine | Admitting: Family Medicine

## 2018-02-26 ENCOUNTER — Encounter (INDEPENDENT_AMBULATORY_CARE_PROVIDER_SITE_OTHER): Payer: Self-pay

## 2018-02-26 VITALS — BP 145/94 | Ht 74.0 in | Wt 344.0 lb

## 2018-02-26 DIAGNOSIS — M1712 Unilateral primary osteoarthritis, left knee: Secondary | ICD-10-CM | POA: Insufficient documentation

## 2018-02-26 DIAGNOSIS — M25561 Pain in right knee: Secondary | ICD-10-CM

## 2018-02-26 DIAGNOSIS — M25562 Pain in left knee: Principal | ICD-10-CM

## 2018-02-26 DIAGNOSIS — G8929 Other chronic pain: Secondary | ICD-10-CM

## 2018-02-26 DIAGNOSIS — G959 Disease of spinal cord, unspecified: Secondary | ICD-10-CM | POA: Diagnosis not present

## 2018-02-26 NOTE — Progress Notes (Signed)
    CHIEF COMPLAINT / HPI: Bilateral but left greater than right knee pain.  He has had problems with his knees for quite some time.  Want to come by and see if there is anything additional we had to offer him.  The corticosteroid injections have not helped.  Feels like his left leg is dragging behind him.  He has thought that was related to his cervical spine disease and that it might get better after his neck surgery that has not so he is here for follow-up of his knee.  Pain is constant, 1010.  Has trouble flexing and extending.    REVIEW OF SYSTEMS: No unusual weight change, no fever.  See HPI  PERTINENT  PMH / PSH: I have reviewed the patient's medications, allergies, past medical and surgical history, smoking status and updated in the EMR as appropriate. 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  Poor flexion and extension lacking full flexion by 10 degrees and full extension by 20 degrees.  Left knee deformity.  Left leg is shorter than the right. GAIT: Limited use of left lower extremity, very antalgic lumbering gait with Trendelenburg.  Use of upper body to propel into swing phase.  ASSESSMENT / PLAN: #1.  Severe osteoarthritis left knee causing significant deformity and leg length discrepancy.  He basically has limited ambulation and requires upper body to propel himself forward.  This causes severe disability.  I think some of this may have been related to his myelopathy from his neck but after I saw his knee x-ray from today, I suspect a portion of this is related to his essentially nonfunctional knee.  Unclear whether total knee replacement would be beneficial to him or not.  I  would be happy to send him to orthopedics for further evaluation and management I did offer him a corticosteroid injection today he said they do not help, after reviewing the x-ray I can see why they do not help. 2.  Chronic myelopathy from spinal cord compression which continues despite ACDF C 6-C7.

## 2018-03-04 ENCOUNTER — Telehealth: Payer: Self-pay

## 2018-03-04 NOTE — Telephone Encounter (Signed)
Informed pt that Dr. Nori Riis would be back in the office tomorrow and we will get back to him then.

## 2018-03-05 ENCOUNTER — Other Ambulatory Visit: Payer: Self-pay

## 2018-03-05 ENCOUNTER — Telehealth: Payer: Self-pay

## 2018-03-05 DIAGNOSIS — M1712 Unilateral primary osteoarthritis, left knee: Secondary | ICD-10-CM

## 2018-03-05 MED ORDER — TRAMADOL HCL 50 MG PO TABS: 50.0000 mg | ORAL_TABLET | Freq: Two times a day (BID) | ORAL | 0 refills | Status: DC

## 2018-03-05 NOTE — Telephone Encounter (Signed)
Pt called nurse line requesting a refill on his tramadol. Please advise.

## 2018-03-05 NOTE — Telephone Encounter (Signed)
Julian Lucas

## 2018-03-05 NOTE — Telephone Encounter (Signed)
Called pt to inform him of his x-ray results. Offered him the option to be referred to Dr. Mayer Camel at [facility]. He agreed.   Appt: 03/18/2018 @ 10:15 am with Dr. Mayer Camel.

## 2018-03-06 ENCOUNTER — Ambulatory Visit (INDEPENDENT_AMBULATORY_CARE_PROVIDER_SITE_OTHER): Payer: Medicaid Other | Admitting: Psychiatry

## 2018-03-06 ENCOUNTER — Encounter (HOSPITAL_COMMUNITY): Payer: Self-pay | Admitting: Psychiatry

## 2018-03-06 ENCOUNTER — Other Ambulatory Visit: Payer: Self-pay

## 2018-03-06 VITALS — BP 142/98 | Wt 348.8 lb

## 2018-03-06 DIAGNOSIS — G8929 Other chronic pain: Secondary | ICD-10-CM

## 2018-03-06 DIAGNOSIS — R200 Anesthesia of skin: Secondary | ICD-10-CM

## 2018-03-06 DIAGNOSIS — F0630 Mood disorder due to known physiological condition, unspecified: Secondary | ICD-10-CM

## 2018-03-06 DIAGNOSIS — M2550 Pain in unspecified joint: Secondary | ICD-10-CM

## 2018-03-06 DIAGNOSIS — F411 Generalized anxiety disorder: Secondary | ICD-10-CM

## 2018-03-06 DIAGNOSIS — F331 Major depressive disorder, recurrent, moderate: Secondary | ICD-10-CM

## 2018-03-06 DIAGNOSIS — R29818 Other symptoms and signs involving the nervous system: Secondary | ICD-10-CM | POA: Diagnosis not present

## 2018-03-06 DIAGNOSIS — F1220 Cannabis dependence, uncomplicated: Secondary | ICD-10-CM

## 2018-03-06 MED ORDER — DULOXETINE HCL 30 MG PO CPEP: 30.0000 mg | ORAL_CAPSULE | Freq: Two times a day (BID) | ORAL | 1 refills | Status: DC

## 2018-03-06 NOTE — Patient Instructions (Signed)
Refer to therapy 
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
## 2018-03-08 ENCOUNTER — Telehealth: Payer: Self-pay

## 2018-03-08 NOTE — Telephone Encounter (Signed)
Patient called and was only dispensed 7 days of Tramadol. Spoke to pharmacist and patient now filing Medicaid and prior auth required. Form placed in MD's box for completion along with Medicaid formulary.  Danley Danker, RN [facility] RN)

## 2018-03-10 NOTE — Telephone Encounter (Signed)
Prior approval for Tramadol completed via [place] [employer].  Med approved for 03/10/18 - 09/06/18.  Prior approval  # [id number]. [facility] informed.  Danley Danker, RN [facility] RN)

## 2018-03-10 NOTE — Telephone Encounter (Signed)
Completed PA info in [place] for Tramadol. Status pending. Will recheck status in 24 hours.  Danley Danker, RN [facility] RN)

## 2018-03-10 NOTE — Telephone Encounter (Signed)
Filled out prior auth and returned to RN.

## 2018-03-11 NOTE — Telephone Encounter (Signed)
Pt called because he was told by [facility] that we still needed to get the PA.  Spoke with [facility], they have received the PA, but since he picked up #7 last night he cant get anymore until 03/14/18.  Pt informed and appreciative. Kaylyne Axton, Salome Spotted, CMA

## 2018-03-14 ENCOUNTER — Other Ambulatory Visit: Payer: Self-pay | Admitting: Family Medicine

## 2018-03-17 ENCOUNTER — Other Ambulatory Visit: Payer: Self-pay | Admitting: Family Medicine

## 2018-03-19 NOTE — Telephone Encounter (Signed)
Informed by [facility] that pt received a script from Dr. Mayer Camel as well for tramadol .  They will place this on hold.  They also received 2 scripts from [place] on 03/05/18 for tramadol, asked that she cancel one because we only sent one. Jaydee Ingman, Salome Spotted, CMA

## 2018-03-31 ENCOUNTER — Telehealth: Payer: Self-pay

## 2018-03-31 NOTE — Telephone Encounter (Signed)
Patient would like to speak with PCP about possibly increasing quantities of his medications because insurance is going to run out.  Call back is [phone]  Danley Danker, RN [facility])

## 2018-04-01 NOTE — Telephone Encounter (Signed)
White team, could you clarify which medications he is concerned about and when his insurance will run out? He likely needs an appt to go over this, as I or another doctor could possibly switch to alternative medications of the same class that may have cheaper cash prices.

## 2018-04-02 NOTE — Telephone Encounter (Signed)
Contacted pt to inquire about below and pt said that the medicine would be his tramadol and really all of his medicines.  Scheduled appointment with pt on 8/20 with PCP to discuss his medicine and see what they need to do to prepare for loss of insurance. Katharina Caper, April D, [place]

## 2018-04-08 ENCOUNTER — Ambulatory Visit (HOSPITAL_COMMUNITY): Payer: Self-pay | Admitting: Licensed Clinical Social Worker

## 2018-04-11 ENCOUNTER — Other Ambulatory Visit: Payer: Self-pay | Admitting: Family Medicine

## 2018-04-12 ENCOUNTER — Encounter (HOSPITAL_COMMUNITY): Payer: Self-pay | Admitting: Licensed Clinical Social Worker

## 2018-04-12 ENCOUNTER — Ambulatory Visit (INDEPENDENT_AMBULATORY_CARE_PROVIDER_SITE_OTHER): Payer: Medicaid Other | Admitting: Licensed Clinical Social Worker

## 2018-04-12 DIAGNOSIS — F331 Major depressive disorder, recurrent, moderate: Secondary | ICD-10-CM | POA: Diagnosis not present

## 2018-04-12 DIAGNOSIS — F411 Generalized anxiety disorder: Secondary | ICD-10-CM | POA: Diagnosis not present
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
## 2018-04-13 ENCOUNTER — Ambulatory Visit (INDEPENDENT_AMBULATORY_CARE_PROVIDER_SITE_OTHER): Payer: Medicaid Other | Admitting: Family Medicine

## 2018-04-13 ENCOUNTER — Encounter: Payer: Self-pay | Admitting: Family Medicine

## 2018-04-13 ENCOUNTER — Other Ambulatory Visit: Payer: Self-pay

## 2018-04-13 VITALS — BP 130/92 | HR 84 | Temp 98.7°F | Ht 74.0 in | Wt 334.0 lb

## 2018-04-13 DIAGNOSIS — R12 Heartburn: Secondary | ICD-10-CM

## 2018-04-13 DIAGNOSIS — M1712 Unilateral primary osteoarthritis, left knee: Secondary | ICD-10-CM | POA: Diagnosis not present

## 2018-04-13 DIAGNOSIS — G8929 Other chronic pain: Secondary | ICD-10-CM | POA: Diagnosis not present

## 2018-04-13 DIAGNOSIS — I10 Essential (primary) hypertension: Secondary | ICD-10-CM | POA: Diagnosis not present

## 2018-04-13 DIAGNOSIS — M2550 Pain in unspecified joint: Secondary | ICD-10-CM | POA: Diagnosis not present

## 2018-04-13 MED ORDER — RANITIDINE HCL 150 MG PO TABS: 150.0000 mg | ORAL_TABLET | Freq: Two times a day (BID) | ORAL | 1 refills | Status: DC | PRN

## 2018-04-13 MED ORDER — TRAMADOL HCL 50 MG PO TABS: 50.0000 mg | ORAL_TABLET | Freq: Two times a day (BID) | ORAL | 0 refills | Status: DC

## 2018-04-13 MED ORDER — CYCLOBENZAPRINE HCL 10 MG PO TABS: ORAL_TABLET | ORAL | 1 refills | Status: DC

## 2018-04-13 MED ORDER — GABAPENTIN 300 MG PO CAPS: 300.0000 mg | ORAL_CAPSULE | Freq: Three times a day (TID) | ORAL | 1 refills | Status: DC

## 2018-04-13 MED ORDER — LOSARTAN POTASSIUM 25 MG PO TABS: 25.0000 mg | ORAL_TABLET | Freq: Every day | ORAL | 1 refills | Status: DC

## 2018-04-13 NOTE — Patient Instructions (Signed)
It was a pleasure to see you today! Thank you for choosing [facility] for your primary care. Julian Lucas was seen for med refills.   Our plans for today were:  Keep up the great work with eating healthily. Remember you can eat as many vegetables as you want!   You should return to our clinic to see Dr. Lindell Noe in 3 months for pain, BP.   Best,  Dr. Lindell Noe

## 2018-04-13 NOTE — Progress Notes (Signed)
CC: med refills  HPI  Medicaid will run out as soon as he starts working again. Currently in the disability process. Then he could work part time. Estimate is 3-6 months for this process to be completed. Orange card runs out next month.  He is worried about how to pay for his medicines once his orange card coverage runs out.  Flexeril - helps with back the most. Once every other day.  Psych upped cymbalta to 30 x 4.   Wt Readings from Last 3 Encounters:  04/13/18 (!) 334 lb (151.5 kg)  02/26/18 (!) 344 lb (156 kg)  02/08/18 (!) 352 lb 6.4 oz (159.8 kg)   Diet - stopped drinking juice, soda, and koolaid. Drinks lot of water. Eats 1.5 meals per day. Salad, air fryer. Less grease.   L knee - ortho told him not to come back until he looses 40 lbs. Ortho never gave tramadol rx.   GERD - takes PPI PRN, improved with diet as above.   ROS: Denies CP, SOB, abdominal pain, dysuria, changes in BMs.   CC, SH/smoking status, and VS noted  Objective: BP (!) 130/92   Pulse 84   Temp 98.7 F (37.1 C) (Oral)   Ht 6\' 2"  (1.88 m)   Wt (!) 334 lb (151.5 kg)   SpO2 94%   BMI 42.88 kg/m  Gen: NAD, alert, cooperative, and pleasant. HEENT: NCAT, EOMI, PERRL CV: RRR, no murmur Resp: CTAB, no wheezes, non-labored Abd: SNTND, BS present, no guarding or organomegaly Ext: No edema, warm.  Left knee brace, tender to palpation to both medial and lateral     Joint lines. Neuro: Alert and oriented, Speech clear, No gross deficits  Assessment and plan:  HYPERTENSION, BENIGN ESSENTIAL refilled Cozaar today, recheck 3 months.  Osteoarthritis of left knee, severe continue tramadol, provided refill for this today. he has actually made significant progress in his goal to lose 40 pounds in order to have Ortho consider him for surgery.  Heart burn Some improvement with diet changes, continue zantac.   Severe obesity (BMI >= 40) (HCC) significnat weight loss due to diet changes, congratulated  patient.   Chronic pain of multiple joints Refilled gabapentin and flexeril today.    No orders of the defined types were placed in this encounter.   Meds ordered this encounter  Medications  . cyclobenzaprine (FLEXERIL) 10 MG tablet    Sig: TAKE 1 TABLET BY MOUTH AT BEDTIME AS NEEDED FOR MUSCLE SPASMS    Dispense:  30 tablet    Refill:  1  . gabapentin (NEURONTIN) 300 MG capsule    Sig: Take 1 capsule (300 mg total) by mouth 3 (three) times daily.    Dispense:  90 capsule    Refill:  1  . losartan (COZAAR) 25 MG tablet    Sig: Take 1 tablet (25 mg total) by mouth daily.    Dispense:  90 tablet    Refill:  1  . traMADol (ULTRAM) 50 MG tablet    Sig: Take 1 tablet (50 mg total) by mouth 2 (two) times daily.    Dispense:  120 tablet    Refill:  0    This request is for a new prescription for a controlled substance as required by Federal/State law..  . ranitidine (ZANTAC) 150 MG tablet    Sig: Take 1 tablet (150 mg total) by mouth 2 (two) times daily as needed for heartburn.    Dispense:  60 tablet  Refill:  1     Ralene Ok, MD, PGY3 04/16/2018 2:54 PM

## 2018-04-16 NOTE — Assessment & Plan Note (Signed)
continue tramadol, provided refill for this today. he has actually made significant progress in his goal to lose 40 pounds in order to have Ortho consider him for surgery.

## 2018-04-16 NOTE — Assessment & Plan Note (Addendum)
Refilled gabapentin and flexeril today.

## 2018-04-16 NOTE — Assessment & Plan Note (Signed)
refilled Cozaar today, recheck 3 months.

## 2018-04-16 NOTE — Assessment & Plan Note (Signed)
Some improvement with diet changes, continue zantac.

## 2018-04-16 NOTE — Assessment & Plan Note (Signed)
significnat weight loss due to diet changes, congratulated patient.

## 2018-04-19 ENCOUNTER — Ambulatory Visit (HOSPITAL_COMMUNITY): Payer: Self-pay | Admitting: Psychiatry

## 2018-04-20 ENCOUNTER — Ambulatory Visit (INDEPENDENT_AMBULATORY_CARE_PROVIDER_SITE_OTHER): Payer: Medicaid Other | Admitting: Psychiatry

## 2018-04-20 ENCOUNTER — Encounter (HOSPITAL_COMMUNITY): Payer: Self-pay | Admitting: Psychiatry

## 2018-04-20 DIAGNOSIS — Z818 Family history of other mental and behavioral disorders: Secondary | ICD-10-CM | POA: Diagnosis not present

## 2018-04-20 DIAGNOSIS — R200 Anesthesia of skin: Secondary | ICD-10-CM | POA: Diagnosis not present

## 2018-04-20 DIAGNOSIS — G8929 Other chronic pain: Secondary | ICD-10-CM | POA: Diagnosis not present

## 2018-04-20 DIAGNOSIS — Z811 Family history of alcohol abuse and dependence: Secondary | ICD-10-CM | POA: Diagnosis not present

## 2018-04-20 DIAGNOSIS — F1290 Cannabis use, unspecified, uncomplicated: Secondary | ICD-10-CM

## 2018-04-20 DIAGNOSIS — F17210 Nicotine dependence, cigarettes, uncomplicated: Secondary | ICD-10-CM

## 2018-04-20 NOTE — Progress Notes (Signed)
BH MD/PA/NP OP Progress Note  04/20/2018 9:28 AM Julian Lucas  MRN:  [id number]  Chief Complaint: patient presents for medication management appointment. HPI: Patient returns for medication management appointment. He is being followed by Dr. Adele Schilder who is currently out of office and one covering for.  Patient expresses understanding that he will continue to follow with Dr. Adele Schilder.   He is a 42 year old male, lives with girlfriend, states that he recently returned to work as a Scientist, clinical (histocompatibility and immunogenetics).  He has a history of chronic pain, affecting mostly his lower back and his left knee. He has a history of depression for which she is currently on Cymbalta.  Remote history of cocaine abuse , in remission. History of cannabis abuse . Currently he reports he is doing better.  He is happy that he has been able to return to work.  He is also making a consistent effort to lose weight in preparation for an elective knee surgery.  He states he has already lost 18 pounds and his goal is to "get down below 300 pounds".  He is very motivated in this and states he has made dietary adjustments such as decreasing consumption of sodas and juices. At this time does not endorse anhedonia or persistent sadness and he denies suicidal ideations. He is tolerating Cymbalta well, he is currently taking it at 30 mg twice daily.  Denies side effects. He is also on Neurontin 300 mg 3 times daily prescribed by his primary care doctor.  Visit Diagnosis:    ICD-10-CM   1. Numbness R20.0     Past Psychiatric History:   Past Medical History:  Past Medical History:  Diagnosis Date  . Anxiety   . Bipolar disorder ([place])   . 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 Chronic pain of multiple joints   . DEGENERATIVE JOINT DISEASE, LEFT KNEE 09/09/2010   Qualifier: Diagnosis of  By: Oneida Alar MD, KARL    . Depression   . GERD (gastroesophageal reflux disease)   . HERPES GENITALIS 11/21/2009   Qualifier: Diagnosis  of  By: Jeannine Kitten MD, Rodman Key    . Hypertension   . PULMONARY EMBOLISM, HX OF 11/21/2009   Qualifier: Diagnosis of  By: Tye Savoy MD, Tommi Rumps    . UNEQUAL LEG LENGTH 08/09/2010   Qualifier: Diagnosis of  By: Ernestina Patches MD, Remo Lipps      Past Surgical History:  Procedure Laterality Date  . ANTERIOR CERVICAL DECOMP/DISCECTOMY FUSION N/A 11/23/2017   Procedure: ANTERIOR CERVICAL DECOMPRESSION AND FUSION CERVICAL SIX-SEVEN;  Surgeon: Kary Kos, MD;  Location: [facility];  Service: Neurosurgery;  Laterality: N/A;  anterior  . KNEE SURGERY    . NECK SURGERY    . TONSILECTOMY, ADENOIDECTOMY, BILATERAL MYRINGOTOMY AND TUBES      Family Psychiatric History:   Family History:  Family History  Problem Relation Age of Onset  . Lung cancer Father   . Alcohol abuse Father   . Depression Mother   . Hypertension Mother   . Heart disease Mother     Social History:  Social History   Socioeconomic History  . Marital status: Significant Other    Spouse name: Not on file  . Number of children: 2  . Years of education: Not on file  . Highest education level: High school graduate  Occupational History    Comment: unemployed  Social Needs  . Financial resource strain: Somewhat hard  . Food insecurity:    Worry: Sometimes true    Inability: Sometimes  true  . Transportation needs:    Medical: No    Non-medical: No  Tobacco Use  . Smoking status: Current Some Day Smoker    Types: Cigars  . Smokeless tobacco: Never Used  . Tobacco comment: "smokes every once in a while"  Substance and Sexual Activity  . Alcohol use: Not Currently  . Drug use: Yes    Types: Marijuana    Comment: used today  . Sexual activity: Not Currently  Lifestyle  . Physical activity:    Days per week: 0 days    Minutes per session: 0 min  . Stress: Rather much  Relationships  . Social connections:    Talks on phone: Twice a week    Gets together: Never    Attends religious service: Never    Active member of club or  organization: No    Attends meetings of clubs or organizations: Never    Relationship status: Not on file  Other Topics Concern  . Not on file  Social History Narrative   ** Merged History Encounter **        Allergies: No Known Allergies  Metabolic Disorder Labs: Lab Results  Component Value Date   HGBA1C  11/01/2009    5.1 (NOTE) The ADA recommends the following therapeutic goal for glycemic control related to Hgb A1c measurement: Goal of therapy: <6.5 Hgb A1c  Reference: American Diabetes Association: Clinical Practice Recommendations 2010, Diabetes Care, 2010, 33: (Suppl  1).   MPG 100 11/01/2009   No results found for: PROLACTIN Lab Results  Component Value Date   CHOL 183 11/21/2009   TRIG 97 11/21/2009   HDL 47 11/21/2009   CHOLHDL 3.9 Ratio 11/21/2009   VLDL 19 11/21/2009   LDLCALC 117 (H) 11/21/2009     Therapeutic Level Labs: No results found for: LITHIUM No results found for: VALPROATE No components found for:  CBMZ  Current Medications: Current Outpatient Medications  Medication Sig Dispense Refill  . cyclobenzaprine (FLEXERIL) 10 MG tablet TAKE 1 TABLET BY MOUTH AT BEDTIME AS NEEDED FOR MUSCLE SPASMS 30 tablet 1  . DULoxetine (CYMBALTA) 30 MG capsule Take 1-2 capsules (30-60 mg total) by mouth 2 (two) times daily. Take twice a day 60 capsule 1  . gabapentin (NEURONTIN) 300 MG capsule Take 1 capsule (300 mg total) by mouth 3 (three) times daily. 90 capsule 1  . losartan (COZAAR) 25 MG tablet Take 1 tablet (25 mg total) by mouth daily. 90 tablet 1  . Menthol, Topical Analgesic, (BIOFREEZE EX) Apply 1 application topically daily as needed (pain).    . Menthol, Topical Analgesic, (ICY HOT EX) Apply 1 application topically daily as needed (pain).    . ranitidine (ZANTAC) 150 MG tablet Take 1 tablet (150 mg total) by mouth 2 (two) times daily as needed for heartburn. 60 tablet 1  . traMADol (ULTRAM) 50 MG tablet Take 1 tablet (50 mg total) by mouth 2 (two) times  daily. 120 tablet 0  . Vitamin D, Ergocalciferol, (DRISDOL) 50000 units CAPS capsule TAKE 1 CAPSULE (50,000 UNITS TOTAL) BY MOUTH EVERY 7 (SEVEN) DAYS. 8 capsule 0   No current facility-administered medications for this visit.      Musculoskeletal: Strength & Muscle Tone: within normal limits Gait & Station: Gait is slow/walks with cane Patient leans: N/A  Psychiatric Specialty Exam: ROS reports chronic lower back pain and chronic left knee pain  There were no vitals taken for this visit.There is no height or weight on file to calculate  BMI.  General Appearance: Well Groomed  Eye Contact:  Good  Speech:  Normal Rate  Volume:  Normal  Mood:  Reports improving mood and states he feels better than he did, currently presents euthymic  Affect:  Appropriate and Smiles at times appropriately during session  Thought Process:  Linear and Descriptions of Associations: Intact  Orientation:  Other:  Fully alert and attentive  Thought Content: No hallucinations, no delusions   Suicidal Thoughts:  No-denies suicidal ideations  Homicidal Thoughts:  No  Memory:  Recent and remote grossly intact  Judgement:  Other:  Improving  Insight:  Present  Psychomotor Activity:  Normal  Concentration:  Concentration: Good and Attention Span: Good  Recall:  Good  Fund of Knowledge: Good  Language: Good  Akathisia:  Negative  Handed:  Right  AIMS (if indicated): AIMS test not done today  Assets:  Desire for Improvement Resilience Others:  Employed  ADL's:  Intact  Cognition: WNL  Sleep:  Good   Screenings: PHQ2-9     Office Visit from 02/08/2018 in [facility] Office Visit from 09/10/2017 in [facility] Office Visit from 07/27/2017 in [facility] Office Visit from 04/17/2017 in [facility] Office Visit from 12/29/2016 in [facility]  PHQ-2 Total Score  0  0  0  0  0  PHQ-9 Total Score  -  -  -  -   0       Assessment and Plan: 42 year old male, history of depression, currently on Cymbalta.  Reports improvement, and he describes improving mood.  Currently presents euthymic with a reactive/appropriate affect.  He reports he recently returned to work.  He is also making consistent effort to lose weight in preparation for an elective knee surgery which she hopes will help alleviate his chronic knee pain and presents motivated/future oriented/not anatomic at this time.  He states he is tolerating Cymbalta well, denies side effects.  Currently he is taking 30 mg twice daily.  We discussed option of changing this medication dose to 60 mg once a day but prefers twice daily dosing.  Does not currently need a prescription, as he states he has a refill available to him. Will return in 2 months for follow-up, agrees to contact clinic sooner should to be any worsening or concerns prior. No medication prescribed by me today.   Jenne Campus, MD 04/20/2018, 9:28 AM

## 2018-05-15 ENCOUNTER — Emergency Department (HOSPITAL_COMMUNITY)
Admission: EM | Admit: 2018-05-15 | Discharge: 2018-05-15 | Disposition: A | Discharge status: Home / Self Care | Payer: Medicaid Other | Attending: Emergency Medicine | Admitting: Emergency Medicine

## 2018-05-15 ENCOUNTER — Emergency Department (HOSPITAL_COMMUNITY): Payer: Medicaid Other

## 2018-05-15 ENCOUNTER — Encounter (HOSPITAL_COMMUNITY): Payer: Self-pay

## 2018-05-15 ENCOUNTER — Other Ambulatory Visit: Payer: Self-pay

## 2018-05-15 DIAGNOSIS — M25512 Pain in left shoulder: Secondary | ICD-10-CM | POA: Insufficient documentation

## 2018-05-15 DIAGNOSIS — F17210 Nicotine dependence, cigarettes, uncomplicated: Secondary | ICD-10-CM | POA: Insufficient documentation

## 2018-05-15 DIAGNOSIS — M25562 Pain in left knee: Secondary | ICD-10-CM | POA: Insufficient documentation

## 2018-05-15 DIAGNOSIS — M25571 Pain in right ankle and joints of right foot: Secondary | ICD-10-CM | POA: Diagnosis not present

## 2018-05-15 DIAGNOSIS — I10 Essential (primary) hypertension: Secondary | ICD-10-CM | POA: Insufficient documentation

## 2018-05-15 DIAGNOSIS — G8929 Other chronic pain: Secondary | ICD-10-CM | POA: Insufficient documentation

## 2018-05-15 DIAGNOSIS — Z79899 Other long term (current) drug therapy: Secondary | ICD-10-CM | POA: Insufficient documentation

## 2018-05-15 DIAGNOSIS — W19XXXA Unspecified fall, initial encounter: Secondary | ICD-10-CM | POA: Diagnosis not present

## 2018-05-15 LAB — CBC WITH DIFFERENTIAL/PLATELET
Abs Immature Granulocytes: 0 10*3/uL (ref 0.0–0.1)
BASOS PCT: 0 %
Basophils Absolute: 0 10*3/uL (ref 0.0–0.1)
EOS ABS: 0.1 10*3/uL (ref 0.0–0.7)
Eosinophils Relative: 1 %
HCT: 44.1 % (ref 39.0–52.0)
Hemoglobin: 13.9 g/dL (ref 13.0–17.0)
Immature Granulocytes: 1 %
Lymphocytes Relative: 29 %
Lymphs Abs: 2.4 10*3/uL (ref 0.7–4.0)
MCH: 30.3 pg (ref 26.0–34.0)
MCHC: 31.5 g/dL (ref 30.0–36.0)
MCV: 96.3 fL (ref 78.0–100.0)
MONOS PCT: 12 %
Monocytes Absolute: 1 10*3/uL (ref 0.1–1.0)
Neutro Abs: 4.9 10*3/uL (ref 1.7–7.7)
Neutrophils Relative %: 57 %
PLATELETS: 196 10*3/uL (ref 150–400)
RBC: 4.58 MIL/uL (ref 4.22–5.81)
RDW: 12.2 % (ref 11.5–15.5)
WBC: 8.4 10*3/uL (ref 4.0–10.5)

## 2018-05-15 LAB — COMPREHENSIVE METABOLIC PANEL
ALT: 19 U/L (ref 0–44)
AST: 18 U/L (ref 15–41)
Albumin: 3.8 g/dL (ref 3.5–5.0)
Alkaline Phosphatase: 79 U/L (ref 38–126)
Anion gap: 10 (ref 5–15)
BUN: 22 mg/dL — ABNORMAL HIGH (ref 6–20)
CALCIUM: 9.3 mg/dL (ref 8.9–10.3)
CO2: 23 mmol/L (ref 22–32)
CREATININE: 0.76 mg/dL (ref 0.61–1.24)
Chloride: 108 mmol/L (ref 98–111)
GFR calc non Af Amer: >60 mL/min (ref >60)
Glucose, Bld: 96 mg/dL (ref 70–99)
POTASSIUM: 4 mmol/L (ref 3.5–5.1)
Sodium: 141 mmol/L (ref 135–145)
TOTAL PROTEIN: 7 g/dL (ref 6.5–8.1)
Total Bilirubin: 1.8 mg/dL — ABNORMAL HIGH (ref 0.3–1.2)

## 2018-05-15 MED ORDER — OXYCODONE-ACETAMINOPHEN 5-325 MG PO TABS
1.0000 | ORAL_TABLET | ORAL | Status: DC | PRN
  Administered 2018-05-15: 1 via ORAL
  Filled 2018-05-15: qty 1

## 2018-05-15 MED ORDER — OXYCODONE-ACETAMINOPHEN 5-325 MG PO TABS
1.0000 | ORAL_TABLET | Freq: Once | ORAL | Status: AC
  Administered 2018-05-15: 1 via ORAL
  Filled 2018-05-15: qty 1

## 2018-05-15 MED ORDER — NAPROXEN 500 MG PO TABS: 500.0000 mg | ORAL_TABLET | Freq: Two times a day (BID) | ORAL | 0 refills | Status: AC

## 2018-05-15 MED ORDER — METHOCARBAMOL 500 MG PO TABS: 500.0000 mg | ORAL_TABLET | Freq: Two times a day (BID) | ORAL | 0 refills | Status: AC

## 2018-05-15 NOTE — Discharge Instructions (Signed)
I have provided a referral to an orthopedist please schedule an appointment as needed.  I have also provided pain medication for symptomatic treatment.  Please take as needed.  I will also provide you with a work note until your symptoms improve.

## 2018-05-15 NOTE — ED Provider Notes (Signed)
[facility] EMERGENCY DEPARTMENT Provider Note   CSN: [id number] Arrival date & time: 05/15/18  1158     History   Chief Complaint Chief Complaint  Patient presents with  . Fall  . Ankle Pain  . Foot Pain    HPI Alphonso Gregson is a 42 y.o. male.  42 y/o male with a PMH of Bipolar, HTN, and Cervical disc disorder with myelopathy presents to the ED via EMS with a chief complaint of fall night at 3 AM.  States he fell his left leg and right ankle give out and get week while he was at work.  Patient works at [employer].  Reports left shoulder pain along with left knee pain and right ankle pain.  Had a previous ACDF fusion done on April 2019 by Dr. Saintclair Halsted at [facility].Patient denies hitting his head, or LOC. He states he was told he needed a knee replacement and has "poor ligaments on my right ankle.Patient not tried any medical therapy for his pain.  He denies chest pain, shortness of breath, headache, LOC or other complaints.     Past Medical History:  Diagnosis Date  . Anxiety   . Bipolar disorder ([place])   . 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 Chronic pain of multiple joints   . DEGENERATIVE JOINT DISEASE, LEFT KNEE 09/09/2010   Qualifier: Diagnosis of  By: Oneida Alar MD, KARL    . Depression   . GERD (gastroesophageal reflux disease)   . HERPES GENITALIS 11/21/2009   Qualifier: Diagnosis of  By: Jeannine Kitten MD, Rodman Key    . Hypertension   . PULMONARY EMBOLISM, HX OF 11/21/2009   Qualifier: Diagnosis of  By: Tye Savoy MD, Tommi Rumps    . UNEQUAL LEG LENGTH 08/09/2010   Qualifier: Diagnosis of  By: Ernestina Patches MD, Remo Lipps      Patient Active Problem List   Diagnosis Date Noted  . Osteoarthritis of left knee, severe 02/26/2018  . Abdominal pain, right upper quadrant 02/09/2018  . Myelopathy ([place]) 11/23/2017  . 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 Right foot pain 11/09/2015  . Pain in joint, ankle and foot 11/09/2015  . Heart burn  11/18/2013  . Dysphagia, unspecified(787.20) 11/18/2013  . Cervical disc disorder with radiculopathy of cervical region 07/27/2013  . Sciatica 01/06/2013  . Chronic pain of multiple joints 09/09/2010  . UNEQUAL LEG LENGTH 08/09/2010  . Depression 04/25/2010  . Genital herpes 11/21/2009  . Severe obesity (BMI >= 40) ([place]) 11/21/2009  . TOBACCO ABUSE 11/21/2009  . HYPERTENSION, BENIGN ESSENTIAL 11/21/2009    Past Surgical History:  Procedure Laterality Date  . ANTERIOR CERVICAL DECOMP/DISCECTOMY FUSION N/A 11/23/2017   Procedure: ANTERIOR CERVICAL DECOMPRESSION AND FUSION CERVICAL SIX-SEVEN;  Surgeon: Kary Kos, MD;  Location: [facility];  Service: Neurosurgery;  Laterality: N/A;  anterior  . KNEE SURGERY    . NECK SURGERY    . TONSILECTOMY, ADENOIDECTOMY, BILATERAL MYRINGOTOMY AND TUBES          Home Medications    Prior to Admission medications   Medication Sig Start Date End Date Taking? Authorizing Provider  cyclobenzaprine (FLEXERIL) 10 MG tablet TAKE 1 TABLET BY MOUTH AT BEDTIME AS NEEDED FOR MUSCLE SPASMS 04/13/18   Sela Hilding, MD  DULoxetine (CYMBALTA) 30 MG capsule Take 1-2 capsules (30-60 mg total) by mouth 2 (two) times daily. Take twice a day 03/06/18   Merian Capron, MD  gabapentin (NEURONTIN) 300 MG capsule Take 1 capsule (300 mg total) by mouth  3 (three) times daily. 04/13/18   Sela Hilding, MD  losartan (COZAAR) 25 MG tablet Take 1 tablet (25 mg total) by mouth daily. 04/13/18   Sela Hilding, MD  Menthol, Topical Analgesic, (BIOFREEZE EX) Apply 1 application topically daily as needed (pain).    [provider]  Menthol, Topical Analgesic, (ICY HOT EX) Apply 1 application topically daily as needed (pain).    [provider]  methocarbamol (ROBAXIN) 500 MG tablet Take 1 tablet (500 mg total) by mouth 2 (two) times daily for 7 days. 05/15/18 05/22/18  Janeece Fitting, PA-C  naproxen (NAPROSYN) 500 MG tablet Take 1 tablet (500 mg total) by  mouth 2 (two) times daily for 7 days. 05/15/18 05/22/18  Janeece Fitting, PA-C  ranitidine (ZANTAC) 150 MG tablet Take 1 tablet (150 mg total) by mouth 2 (two) times daily as needed for heartburn. 04/13/18   Sela Hilding, MD  traMADol (ULTRAM) 50 MG tablet Take 1 tablet (50 mg total) by mouth 2 (two) times daily. 04/13/18   Sela Hilding, MD  Vitamin D, Ergocalciferol, (DRISDOL) 50000 units CAPS capsule TAKE 1 CAPSULE (50,000 UNITS TOTAL) BY MOUTH EVERY 7 (SEVEN) DAYS. 12/14/17   Sela Hilding, MD    Family History Family History  Problem Relation Age of Onset  . Lung cancer Father   . Alcohol abuse Father   . Depression Mother   . Hypertension Mother   . Heart disease Mother     Social History Social History   Tobacco Use  . Smoking status: Current Some Day Smoker    Types: Cigars  . Smokeless tobacco: Never Used  . Tobacco comment: "smokes every once in a while"  Substance Use Topics  . Alcohol use: Not Currently  . Drug use: Yes    Types: Marijuana    Comment: used today     Allergies   Patient has no known allergies.   Review of Systems Review of Systems  Constitutional: Negative for chills and fever.  HENT: Negative for sinus pressure.   Respiratory: Negative for shortness of breath.   Cardiovascular: Negative for chest pain and palpitations.  Gastrointestinal: Negative for abdominal pain, diarrhea, rectal pain and vomiting.  Genitourinary: Negative for flank pain and hematuria.  Musculoskeletal: Positive for arthralgias, back pain, joint swelling and myalgias. Negative for neck pain and neck stiffness.  Neurological: Negative for headaches.  All other systems reviewed and are negative.    Physical Exam Updated Vital Signs BP (!) 139/94 (BP Location: Right Arm)   Pulse 72   Temp 98.4 F (36.9 C) (Oral)   Resp 18   Ht 5\' 8"  (1.727 m)   SpO2 98%   BMI 50.78 kg/m   Physical Exam  Constitutional: He is oriented to person, place, and time. He  appears well-developed and well-nourished.  HENT:  Head: Normocephalic and atraumatic.  No racoon's eyes, no battle signs, no Hemotympanum.   Eyes: Pupils are equal, round, and reactive to light.  Neck: Normal range of motion. Neck supple.  Cardiovascular: Normal heart sounds.  Pulses:      Dorsalis pedis pulses are 2+ on the right side.       Posterior tibial pulses are 2+ on the right side.  Pulmonary/Chest: Effort normal and breath sounds normal. He has no wheezes. He exhibits no tenderness.  Abdominal: Soft. Bowel sounds are normal. There is no tenderness.  Musculoskeletal: He exhibits tenderness.       Left knee: He exhibits swelling. He exhibits no deformity, no laceration  and no erythema.       Right ankle: He exhibits decreased range of motion and swelling. He exhibits no deformity, no laceration and normal pulse.       Legs: TTP on lateral aspect of right foot, pulses present. Patient has decreased ROM due to pain. FE,FF 5/5 strength   TTP on left leg above knee.   Neurological: He is alert and oriented to person, place, and time.  Skin: Skin is warm and dry. Abrasion noted. No rash noted. There is erythema.     Multiple abrasions to the left hand.  Nursing note and vitals reviewed.    ED Treatments / Results  Labs (all labs ordered are listed, but only abnormal results are displayed) Labs Reviewed  COMPREHENSIVE METABOLIC PANEL - Abnormal; Notable for the following components:      Result Value   BUN 22 (*)    Total Bilirubin 1.8 (*)    All other components within normal limits  CBC WITH DIFFERENTIAL/PLATELET    EKG None  Radiology Dg Ankle Complete Right  Result Date: 05/15/2018 CLINICAL DATA:  Fall last night.  Right ankle pain. EXAM: RIGHT ANKLE - COMPLETE 3+ VIEW COMPARISON:  None. FINDINGS: There is no evidence of fracture, dislocation, or joint effusion. There is no evidence of arthropathy or other focal bone abnormality. Soft tissues are unremarkable.  IMPRESSION: Negative. Electronically Signed   By: Rolm Baptise M.D.   On: 05/15/2018 16:10   Dg Shoulder Left  Result Date: 05/15/2018 CLINICAL DATA:  Fall, pain EXAM: LEFT SHOULDER - 2+ VIEW COMPARISON:  03/11/2011 FINDINGS: No fracture or dislocation is seen. The joint spaces are preserved. The visualized soft tissues are unremarkable. Visualized left lung is clear. IMPRESSION: Negative. Electronically Signed   By: Julian Hy M.D.   On: 05/15/2018 13:34   Dg Knee Complete 4 Views Left  Result Date: 05/15/2018 CLINICAL DATA:  Fall, pain EXAM: LEFT KNEE - COMPLETE 4+ VIEW COMPARISON:  02/26/2018 FINDINGS: No evidence of acute fracture or dislocation. Old medial tibial plateau fracture is suspected. Healed proximal tibial shaft fracture. Healed proximal fibular shaft fracture. Moderate tricompartmental degenerative changes, most prominent in the patellofemoral compartment. Moderate suprapatellar knee joint effusion. IMPRESSION: Old/healed tibial and fibular fractures, as above. No evidence of acute fracture or dislocation. Moderate tricompartmental degenerative changes, as above. Moderate suprapatellar knee joint effusion. Electronically Signed   By: Julian Hy M.D.   On: 05/15/2018 13:36    Procedures Procedures (including critical care time)  Medications Ordered in ED Medications  oxyCODONE-acetaminophen (PERCOCET/ROXICET) 5-325 MG per tablet 1 tablet (1 tablet Oral Given 05/15/18 1405)  oxyCODONE-acetaminophen (PERCOCET/ROXICET) 5-325 MG per tablet 1 tablet (1 tablet Oral Given 05/15/18 1622)     Initial Impression / Assessment and Plan / ED Course  I have reviewed the triage vital signs and the nursing notes.  Pertinent labs & imaging results that were available during my care of the patient were reviewed by me and considered in my medical decision making (see chart for details).     I have personally reviewed this patient's chart, patient had ACDF in April 2019 by Dr.  Saintclair Halsted, he had a fall this morning at 3 AM at work, he is a Insurance underwriter at [employer].  Reports left shoulder pain along with left knee pain and right ankle pain, all imaging has been negative so far.  Upon examination there is tenderness to palpation of the left knee I will provide patient with a knee sleeve.  Patient  received pain occasion while in the ED, is requesting to be sent home with pain medication.  DG right ankle showed no evidence of fracture, dislocation, joint effusion.  DG knee complete show suprapatellar knee joint effusion.  Old healed tibial and fibular fractures, no evidence of acute fracture or dislocation.  DG left shoulder showed actually dislocation, or acute abnormality. Will have patient ambulate while in the ED I will discharge patient with a referral for orthopedist to follow-up with his shoulder along with provide him a work note until his symptoms and pain resolved.  Reports he has a walker at home and does not need crutches at this time as his left shoulder still hurts, patient requesting a work note until Wednesday as he works with his shoulder and does a lot of lifting.  Patient is ambulatory on his cell phone while in the ED I will discharge him home with pain control and muscle relaxers.  Vitals stable for discharge, patient stable for discharge.  Final Clinical Impressions(s) / ED Diagnoses   Final diagnoses:  Acute pain of left shoulder  Fall, initial encounter  Acute right ankle pain  Chronic pain of left knee    ED Discharge Orders         Ordered    methocarbamol (ROBAXIN) 500 MG tablet  2 times daily     05/15/18 1649    naproxen (NAPROSYN) 500 MG tablet  2 times daily     05/15/18 1649           Janeece Fitting, PA-C 05/15/18 1719    Gareth Morgan, MD 05/16/18 1306

## 2018-05-15 NOTE — ED Notes (Signed)
Discharge instructions discussed with Pt. Pt verbalized understanding. Pt stable and leaving via WC.    

## 2018-05-15 NOTE — ED Notes (Signed)
Pt was able to ambulate with walker.  Pt was complaining of right ankle pain and left shoulder pain.

## 2018-05-15 NOTE — ED Notes (Signed)
Patient transported to X-ray 

## 2018-05-15 NOTE — ED Triage Notes (Signed)
Pt. Stated, I fell from the roof painting, the sun was in my eyes and I fell to ground an landed on my feet. My left foot and ankle is hurting.the roof was 12 feet. high

## 2018-05-15 NOTE — ED Triage Notes (Signed)
Pt from home with ems for mechanical fall at 3am this morning, pt states "my leg gave out". Pt endorsing L shoulder, L back, L knee and right ankle pain. C collar in place and left shoulder splinted by EMS. Pt denies LOC or hitting his head. Given 200 mcg fentanyl en route. VSS, nad pt a.o

## 2018-05-17 ENCOUNTER — Ambulatory Visit (HOSPITAL_COMMUNITY): Payer: Self-pay | Admitting: Licensed Clinical Social Worker

## 2018-05-18 ENCOUNTER — Ambulatory Visit (INDEPENDENT_AMBULATORY_CARE_PROVIDER_SITE_OTHER): Payer: Medicaid Other | Admitting: Licensed Clinical Social Worker

## 2018-05-18 ENCOUNTER — Encounter (HOSPITAL_COMMUNITY): Payer: Self-pay | Admitting: Licensed Clinical Social Worker

## 2018-05-18 DIAGNOSIS — F331 Major depressive disorder, recurrent, moderate: Secondary | ICD-10-CM | POA: Diagnosis not present

## 2018-05-18 NOTE — Progress Notes (Signed)
   THERAPIST PROGRESS NOTE  Session Time: 1:10-2pm  Participation Level: Active  Behavioral Response: CasualAlertDepressed  Type of Therapy: Individual Therapy  Treatment Goals addressed: Improve Psychiatric Symptoms, elevate mood (increased self-esteem, increased self-compassion, increased interaction), improve unhelpful thought patterns, controlled behavior, moderate mood, deliberate speech and thought process(improved social functioning, healthy adjustment to living situation), Learn about diagnosis, healthy coping skills.  Interventions: CBT  Summary: Julian Lucas is a 43 y.o. male who presents for his initial individual counseling session. Spent a considerable amount of time building a trusting therapeutic relationship and gaining background information. Pt lives with his girlfriend who works at [employer] and their 34 month old baby. 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 He works 20 hours per week, trying to get Disability. He has a law group who is helping his with his case. He has chronic pain in his back, knee and ankle. "I am in pain always." Discussed with pt what goals he has for his life. We will discuss this at next session.  Suicidal/Homicidal: no/without plan  Therapist Response: Assessed pt's current functioning and reviewed progress. Assisted pt building a trusting therapeutic relationship.   Plan: Return again in 2-3 weeks.  Diagnosis: Axis I: MDD, recurrent episode moderate        Talyssa Gibas S, LCAS 05/18/2018

## 2018-06-01 ENCOUNTER — Encounter (HOSPITAL_COMMUNITY): Payer: Self-pay | Admitting: Licensed Clinical Social Worker

## 2018-06-01 ENCOUNTER — Ambulatory Visit (INDEPENDENT_AMBULATORY_CARE_PROVIDER_SITE_OTHER): Payer: Medicaid Other | Admitting: Licensed Clinical Social Worker

## 2018-06-01 DIAGNOSIS — F331 Major depressive disorder, recurrent, moderate: Secondary | ICD-10-CM | POA: Diagnosis not present

## 2018-06-01 NOTE — Progress Notes (Signed)
   THERAPIST PROGRESS NOTE  Session Time: 1:10-2pm  Participation Level: Active  Behavioral Response: CasualAlertDepressed  Type of Therapy: Individual Therapy  Treatment Goals addressed: Improve Psychiatric Symptoms, elevate mood (increased self-esteem, increased self-compassion, increased interaction), improve unhelpful thought patterns, controlled behavior, moderate mood, deliberate speech and thought process(improved social functioning, healthy adjustment to living situation), Learn about diagnosis, healthy coping skills.  Interventions: CBT  Summary: Julian Lucas is a 42 y.o. male who presents for his individual counseling session. Pt was limping so bad he could barely walk to my office. Asked open ended questions about his health, disability claim. Pt reports he is frustrated with his girlfriend of 5 years. Asked open ended questions and used empathic reflection. Pt discussed his ED and his sexual needs. Suggested pt make an appt with Urologist. [name] pt name of [facility] and called to confirm they take medicaid. Gave pt information.    Suicidal/Homicidal: no/without plan  Therapist Response: Assessed pt's current functioning and reviewed progress. Assisted pt processing his disability, issues with girlfriend, ED. Assisted pt processing for the management of his stressors.   Plan: Return again in 2-3 weeks.  Diagnosis: Axis I: MDD, recurrent episode moderate        MACKENZIE,LISBETH S, LCAS 06/01/2018

## 2018-06-17 ENCOUNTER — Ambulatory Visit (HOSPITAL_COMMUNITY): Payer: Medicaid Other | Admitting: Psychiatry

## 2018-06-18 ENCOUNTER — Other Ambulatory Visit: Payer: Self-pay | Admitting: Family Medicine

## 2018-06-18 ENCOUNTER — Other Ambulatory Visit (HOSPITAL_COMMUNITY): Payer: Self-pay | Admitting: Psychiatry

## 2018-06-18 DIAGNOSIS — R200 Anesthesia of skin: Secondary | ICD-10-CM

## 2018-06-21 NOTE — Telephone Encounter (Signed)
Please call patient - zantac has been recalled. He can use pepcid OTC instead.

## 2018-06-21 NOTE — Telephone Encounter (Signed)
Pt informed. Julian Lucas, CMA  

## 2018-06-29 ENCOUNTER — Ambulatory Visit (HOSPITAL_COMMUNITY): Payer: Self-pay | Admitting: Licensed Clinical Social Worker

## 2018-06-30 ENCOUNTER — Encounter (HOSPITAL_COMMUNITY): Payer: Self-pay | Admitting: Licensed Clinical Social Worker

## 2018-06-30 ENCOUNTER — Ambulatory Visit (INDEPENDENT_AMBULATORY_CARE_PROVIDER_SITE_OTHER): Payer: Medicaid Other | Admitting: Licensed Clinical Social Worker

## 2018-06-30 DIAGNOSIS — F331 Major depressive disorder, recurrent, moderate: Secondary | ICD-10-CM | POA: Diagnosis not present

## 2018-06-30 NOTE — Progress Notes (Signed)
   THERAPIST PROGRESS NOTE  Session Time: 12:30- 1:20pm  Participation Level: Active  Behavioral Response: CasualAlertDepressed  Type of Therapy: Individual Therapy  Treatment Goals addressed: Improve Psychiatric Symptoms, elevate mood (increased self-esteem, increased self-compassion, increased interaction), improve unhelpful thought patterns, controlled behavior, moderate mood, deliberate speech and thought process(improved social functioning, healthy adjustment to living situation), Learn about diagnosis, healthy coping skills.  Interventions: CBT/Tx plan review  Summary: Julian Lucas is a 42 y.o. male who presents for his individual counseling session. Pt was limping so bad he could barely walk to my office. Asked open ended questions about his health, disability claim. Pt has an atty for his disability claim but has yet to have a hearing. Pt has chronic back, knee and ankle pain. He takes OTC for pain control. He sees different docs for his chronic pain. He cannot see his back doc currently because he owes them a back bill. Discussed budgeting with pt. Pt went to Urologist and was given medication. He and his girlfriend discussed his ED and she is happy there is an intervention available. This has made their relationship better. Asked open ended questions. 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 It is not as busy and he gets more rest during shift for his chronic pain. Discussed with pt alternative ways to assist with/divert his chronic pain.       Suicidal/Homicidal: no/without plan  Therapist Response: Assessed pt's current functioning and reviewed progress. Assisted pt processing his disability, relationship issues with girlfriend, ED, chronic pain, budgeting. Assisted pt processing for the management of his stressors.   Plan: Return again in 2-3 weeks.  Diagnosis: Axis I: MDD, recurrent episode moderate        MACKENZIE,LISBETH S,  LCAS 06/30/2018

## 2018-07-02 ENCOUNTER — Other Ambulatory Visit (HOSPITAL_COMMUNITY): Payer: Self-pay | Admitting: Psychiatry

## 2018-07-02 ENCOUNTER — Ambulatory Visit: Payer: Medicaid Other | Admitting: Family Medicine

## 2018-07-02 ENCOUNTER — Encounter: Payer: Self-pay | Admitting: Family Medicine

## 2018-07-02 VITALS — BP 117/83 | Ht 74.0 in

## 2018-07-02 DIAGNOSIS — R200 Anesthesia of skin: Secondary | ICD-10-CM

## 2018-07-02 DIAGNOSIS — M25571 Pain in right ankle and joints of right foot: Secondary | ICD-10-CM

## 2018-07-02 NOTE — Assessment & Plan Note (Signed)
Patient is referred to Dr. Dennard Nip for discussion of potential weight loss options.  He was told he needs a total knee arthroplasty of his right knee and currently with a BMI of 42 he is unable to do this.  He has tried on several occasions to lose weight however he maintains his usual weight.  He is frustrated with this and is requesting specialist consultation.

## 2018-07-02 NOTE — Patient Instructions (Signed)
Today we referred you to Dr. 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  We also ordered an MRI of your right foot and ankle to rule out OCD lesion as the source of your ongoing pain and instability.  I will see you back in 1 to 2 weeks to discuss these results.

## 2018-07-02 NOTE — Assessment & Plan Note (Signed)
-  Began with an injury that happened back in September -Reviewed x-rays from the emergency department in September 2019 which demonstrated no acute fractures or dislocations -Was initially diagnosed with a lateral ankle sprain however 2 months later he has no significant improvement in his symptoms and now is debilitated with ambulation requiring a cane for ambulation -He also does take anti-inflammatories and tramadol for the pain however continues to have persistent pain -Will order an MRI to rule out an OCD lesion given the mechanism of injury and lack of improvement from time and conservative therapy -He will continue to wear his lace up ASO brace and take anti-inflammatories as needed -We will follow-up to review MRI results in 1 to 2 weeks.  He may be a candidate for tibiotalar injection with cortisone, we discussed this today and he is agreeable to this depending on his MRI findings

## 2018-07-02 NOTE — Progress Notes (Signed)
Male Minish - 42 y.o. male MRN [id number]  Date of birth: [birth year]   Chief complaint: Right ankle pain  SUBJECTIVE:    History of present illness: Julian Lucas is a 42 year old African-American male who presents today with a chief complaint of ongoing right ankle pain.  Back in September, he got off work and had a ankle inversion injury and a mechanical fall.  He went to the emergency room for this and had x-rays of his right ankle which were negative for acute fracture dislocation.  He was diagnosed with a lateral ankle sprain and given a lace up ASO brace for this.  He is also been taking anti-inflammatories and tramadol for the pain.  Now, 2 months later he reports he is no better and is actually worse in terms of pain and disability.  He is ambulating with a cane for balance however he feels like his right ankle is giving out on him.  He also feels weak.  Denies any numbness or tingling.  Denies any ongoing ecchymoses or erythema.  He has never injured his right ankle in the past.  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  Attempts have been made to try and lose weight however he continues to maintain the exact same weight.  He is here today to discuss other options for weight loss.  Denies any groin pain or knee pain on the right.  He states he does have low back pain however he is not getting radicular symptoms currently.  No loss of bowel or bladder function.   Review of systems:  As stated above   Interval past medical history, surgical history, family history, and social history obtained and are unchanged.   Of note, he has osteoarthritis of the left knee severe in nature.  He also has depression, hypertension, reflux, degenerative disc disease and a history of pulmonary embolism.  He is a smoker.  Medications reviewed and unchanged.  Of note, he takes tramadol,  gabapentin, Cymbalta, and anti-inflammatory medications. Allergies reviewed and unchanged.  No known drug allergies.  OBJECTIVE:  Physical exam: Vital signs are reviewed. BP 117/83   Ht 6\' 2"  (1.88 m)   BMI 42.88 kg/m   Gen.: Alert, oriented, appears stated age, in no apparent distress HEENT: Moist oral mucosa Respiratory: Normal respirations, able to speak in full sentences Cardiac: Regular rate, distal pulses 2+ Integumentary: No rashes or ecchymoses Neurologic:  Sensation is intact to light touch L4-S1, he does have discomfort with straight leg raising however no radicular symptoms Gait: Antalgic gait with difficulty bearing weight on both his left knee and his right ankle.  Very slow for movements.  Slow to get up from a seated position. Musculoskeletal: Inspection of the right ankle demonstrates no obvious swelling.  He does have pes planus.  His medial longitudinal arch is collapsed.  He has have exquisite tenderness to palpation over his anterior talar dome as well as over his anterior talofibular ligament.  His ankle is tight secondary to pain and he has restricted range of motion in dorsiflexion past 10 degrees.  He is has full plantarflexion capabilities although painful.  Strength testing is limited secondary to pain.  His EHL and FHL are both intact.  He is not able to go up on his forefeet secondary to pain and feeling of instability.  He is negative anterior drawer.  He  has significant pain with eversion testing.  His dorsalis pedis pulses +2.    ASSESSMENT & PLAN: Ankle pain, right -Began with an injury that happened back in September -Reviewed x-rays from the emergency department in September 2019 which demonstrated no acute fractures or dislocations -Was initially diagnosed with a lateral ankle sprain however 2 months later he has no significant improvement in his symptoms and now is debilitated with ambulation requiring a cane for ambulation -He also does take  anti-inflammatories and tramadol for the pain however continues to have persistent pain -Will order an MRI to rule out an OCD lesion given the mechanism of injury and lack of improvement from time and conservative therapy -He will continue to wear his lace up ASO brace and take anti-inflammatories as needed -We will follow-up to review MRI results in 1 to 2 weeks.  He may be a candidate for tibiotalar injection with cortisone, we discussed this today and he is agreeable to this depending on his MRI findings  Severe obesity (BMI >= 40) (HCC) Patient is referred to Dr. Dennard Nip for discussion of potential weight loss options.  He was told he needs a total knee arthroplasty of his right knee and currently with a BMI of 42 he is unable to do this.  He has tried on several occasions to lose weight however he maintains his usual weight.  He is frustrated with this and is requesting specialist consultation.   Orders Placed This Encounter  Procedures  . MR ANKLE RIGHT WO CONTRAST    Epic order  Wt 326/ ht 6'2/ no claus/ no metal in eyes/  No right ankle sx/ no implants/ cervical fusion / cane  Ins- mcd  Pda/pt     Standing Status:   Future    Standing Expiration Date:   09/02/2019    Order Specific Question:   What is the patient's sedation requirement?    Answer:   No Sedation    Order Specific Question:   Does the patient have a pacemaker or implanted devices?    Answer:   No    Order Specific Question:   Preferred imaging location?    Answer:   [facility] (table limit-550lbs)    Order Specific Question:   Radiology Contrast Protocol - do NOT remove file path    Answer:   [email or web address]  . Ambulatory referral to Family Practice    Referral Priority:   Routine    Referral Type:   Consultation    Referral Reason:   Specialty Services Required    Referred to Provider:   Starlyn Skeans, MD    Requested Specialty:   Family Medicine    Number of Visits  Requested:   [place], DO Sports Medicine Fellow [facility]

## 2018-07-02 NOTE — Progress Notes (Signed)
[facility]: Attending Note: I have reviewed the chart, discussed wit the Sports Medicine Fellow. I agree with assessment and treatment plan as detailed in the [facility] note. I agree with imaging as this has not followed typical course of healing. Concern also as he has TTP anterior talar dome so OCD lesion is a possibility

## 2018-07-11 ENCOUNTER — Ambulatory Visit
Admission: RE | Admit: 2018-07-11 | Discharge: 2018-07-11 | Disposition: A | Discharge status: Home / Self Care | Payer: Medicaid Other | Source: Ambulatory Visit | Attending: Family Medicine | Admitting: Family Medicine

## 2018-07-11 DIAGNOSIS — M25571 Pain in right ankle and joints of right foot: Secondary | ICD-10-CM

## 2018-07-12 ENCOUNTER — Telehealth: Payer: Self-pay | Admitting: Family Medicine

## 2018-07-12 DIAGNOSIS — M25571 Pain in right ankle and joints of right foot: Secondary | ICD-10-CM

## 2018-07-12 NOTE — Telephone Encounter (Signed)
Julian Lucas Please let Julian Lucas know he has a lot of degenerative arthriotis in his ankle. He also has a small cyst (that is probably the new issue). The cyst represents his ankles longstanding issues with arthritis. I would recommend sending him to Dr. Lucia Gaskins at [facility] who is a foot and ankle specialist. See if you can get him an appointment [facility]! Dorcas Mcmurray

## 2018-07-13 ENCOUNTER — Ambulatory Visit (HOSPITAL_COMMUNITY): Payer: Medicaid Other | Admitting: Psychiatry

## 2018-07-13 NOTE — Telephone Encounter (Signed)
Referral placed to [facility]. Pt has appt to see Dr. Lucia Gaskins on 07/15/18 at 4:15 pm.

## 2018-07-16 ENCOUNTER — Ambulatory Visit: Payer: Self-pay | Admitting: Family Medicine

## 2018-07-19 ENCOUNTER — Ambulatory Visit (HOSPITAL_COMMUNITY): Payer: Self-pay | Admitting: Licensed Clinical Social Worker

## 2018-07-19 ENCOUNTER — Encounter (HOSPITAL_COMMUNITY): Payer: Self-pay | Admitting: Licensed Clinical Social Worker

## 2018-07-19 ENCOUNTER — Ambulatory Visit (HOSPITAL_COMMUNITY): Payer: Medicaid Other | Admitting: Licensed Clinical Social Worker

## 2018-07-19 DIAGNOSIS — F331 Major depressive disorder, recurrent, moderate: Secondary | ICD-10-CM

## 2018-07-19 NOTE — Progress Notes (Signed)
   THERAPIST PROGRESS NOTE  Session Time: 4:10-4:25pm  Participation Level: Active  Behavioral Response: CasualAlert/Anxious  Type of Therapy: Individual Therapy  Treatment Goals addressed: Improve Psychiatric Symptoms, elevate mood (increased self-esteem, increased self-compassion, increased interaction), improve unhelpful thought patterns, controlled behavior, moderate mood, deliberate speech and thought process(improved social functioning, healthy adjustment to living situation), Learn about diagnosis, healthy coping skills.  Interventions: CBT  Summary: Julian Lucas is a 42 y.o. male who presents for his individual counseling session on his way to work. Somehow his earlier appointment got canceled so pt only has 15 minutes. He wanted to discuss quickly his family and the upcoming holiday this week. Asked open ended questions and used empathic reflection.      Suicidal/Homicidal: no/without plan  Therapist Response: Assessed pt's current functioning and reviewed progress. Assisted pt processing the upcoming holiday and his family. Assisted pt processing for the management of his stressors.   Plan: Return again in 2-3 weeks.  Diagnosis: Axis I: MDD, recurrent episode moderate        Reilyn Nelson S, LCAS 07/19/2018

## 2018-07-22 ENCOUNTER — Other Ambulatory Visit: Payer: Self-pay | Admitting: Family Medicine

## 2018-08-08 ENCOUNTER — Emergency Department (HOSPITAL_BASED_OUTPATIENT_CLINIC_OR_DEPARTMENT_OTHER)
Admission: EM | Admit: 2018-08-08 | Discharge: 2018-08-08 | Disposition: A | Discharge status: Home / Self Care | Payer: Medicaid Other | Attending: Emergency Medicine | Admitting: Emergency Medicine

## 2018-08-08 ENCOUNTER — Emergency Department (HOSPITAL_BASED_OUTPATIENT_CLINIC_OR_DEPARTMENT_OTHER): Payer: Medicaid Other

## 2018-08-08 ENCOUNTER — Other Ambulatory Visit: Payer: Self-pay

## 2018-08-08 ENCOUNTER — Encounter (HOSPITAL_BASED_OUTPATIENT_CLINIC_OR_DEPARTMENT_OTHER): Payer: Self-pay | Admitting: *Deleted

## 2018-08-08 DIAGNOSIS — Z79899 Other long term (current) drug therapy: Secondary | ICD-10-CM | POA: Insufficient documentation

## 2018-08-08 DIAGNOSIS — W19XXXA Unspecified fall, initial encounter: Secondary | ICD-10-CM | POA: Insufficient documentation

## 2018-08-08 DIAGNOSIS — F17210 Nicotine dependence, cigarettes, uncomplicated: Secondary | ICD-10-CM | POA: Insufficient documentation

## 2018-08-08 DIAGNOSIS — M25562 Pain in left knee: Secondary | ICD-10-CM | POA: Diagnosis present

## 2018-08-08 MED ORDER — FENTANYL CITRATE (PF) 100 MCG/2ML IJ SOLN
INTRAMUSCULAR | Status: AC
  Administered 2018-08-08: 50 ug via INTRAVENOUS
  Filled 2018-08-08: qty 2

## 2018-08-08 MED ORDER — KETOROLAC TROMETHAMINE 60 MG/2ML IM SOLN
60.0000 mg | Freq: Once | INTRAMUSCULAR | Status: DC
  Filled 2018-08-08: qty 2

## 2018-08-08 MED ORDER — DIAZEPAM 5 MG PO TABS
5.0000 mg | ORAL_TABLET | Freq: Once | ORAL | Status: AC
  Administered 2018-08-08: 5 mg via ORAL
  Filled 2018-08-08: qty 1

## 2018-08-08 MED ORDER — KETOROLAC TROMETHAMINE 30 MG/ML IJ SOLN
30.0000 mg | Freq: Once | INTRAMUSCULAR | Status: AC
  Administered 2018-08-08: 30 mg via INTRAVENOUS

## 2018-08-08 MED ORDER — FENTANYL CITRATE (PF) 100 MCG/2ML IJ SOLN
50.0000 ug | INTRAMUSCULAR | Status: DC | PRN
  Administered 2018-08-08: 50 ug via INTRAVENOUS

## 2018-08-08 NOTE — ED Provider Notes (Signed)
[facility] EMERGENCY DEPARTMENT Provider Note   CSN: [id number] Arrival date & time: 08/08/18  1445     History   Chief Complaint Chief Complaint  Patient presents with  . Fall    HPI Julian Lucas is a 42 y.o. male.  HPI   Was walking and knee gave out and fell with knee bent back towards back. Can't put any pressure on knee since the fall.  Happened around 12PM.  Pain severe, now also notes pain in the left hip.  Pain is 12/10.No other areas of pain from the fall.  Got fentanyl here without relief, has not tried anything else.  Pain worse medial portion of knee with radiation to hip.  Reports chronic back pain without change. Chronic numbness of right leg.  Reports left foot has paresthesias.   Past Medical History:  Diagnosis Date  . Anxiety   . Bipolar disorder ([place])   . 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 Chronic pain of multiple joints   . DEGENERATIVE JOINT DISEASE, LEFT KNEE 09/09/2010   Qualifier: Diagnosis of  By: Oneida Alar MD, KARL    . Depression   . GERD (gastroesophageal reflux disease)   . HERPES GENITALIS 11/21/2009   Qualifier: Diagnosis of  By: Jeannine Kitten MD, Rodman Key    . Hypertension   . PULMONARY EMBOLISM, HX OF 11/21/2009   Qualifier: Diagnosis of  By: Tye Savoy MD, Tommi Rumps    . UNEQUAL LEG LENGTH 08/09/2010   Qualifier: Diagnosis of  By: Ernestina Patches MD, Remo Lipps      Patient Active Problem List   Diagnosis Date Noted  . Osteoarthritis of left knee, severe 02/26/2018  . Abdominal pain, right upper quadrant 02/09/2018  . Myelopathy ([place]) 11/23/2017  . 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 Right foot pain 11/09/2015  . Pain in joint, ankle and foot 11/09/2015  . Heart burn 11/18/2013  . Dysphagia, unspecified(787.20) 11/18/2013  . Cervical disc disorder with radiculopathy of cervical region 07/27/2013  . Sciatica 01/06/2013  . Chronic pain of multiple joints 09/09/2010  . UNEQUAL LEG LENGTH 08/09/2010  . Ankle pain,  right 08/02/2010  . Depression 04/25/2010  . Genital herpes 11/21/2009  . Severe obesity (BMI >= 40) ([place]) 11/21/2009  . TOBACCO ABUSE 11/21/2009  . HYPERTENSION, BENIGN ESSENTIAL 11/21/2009    Past Surgical History:  Procedure Laterality Date  . ANTERIOR CERVICAL DECOMP/DISCECTOMY FUSION N/A 11/23/2017   Procedure: ANTERIOR CERVICAL DECOMPRESSION AND FUSION CERVICAL SIX-SEVEN;  Surgeon: Kary Kos, MD;  Location: [facility];  Service: Neurosurgery;  Laterality: N/A;  anterior  . KNEE SURGERY    . NECK SURGERY    . TONSILECTOMY, ADENOIDECTOMY, BILATERAL MYRINGOTOMY AND TUBES          Home Medications    Prior to Admission medications   Medication Sig Start Date End Date Taking? Authorizing Provider  cyclobenzaprine (FLEXERIL) 10 MG tablet TAKE 1 TABLET BY MOUTH AT BEDTIME AS NEEDED FOR MUSCLE SPASMS 04/13/18  Yes Sela Hilding, MD  DULoxetine (CYMBALTA) 30 MG capsule TAKE 1-2 CAPSULES (30-60 MG TOTAL) BY MOUTH 2 (TWO) TIMES DAILY. 06/21/18  Yes Merian Capron, MD  gabapentin (NEURONTIN) 300 MG capsule TAKE 1 CAPSULE BY MOUTH THREE TIMES A DAY 07/26/18  Yes Sela Hilding, MD  losartan (COZAAR) 25 MG tablet Take 1 tablet (25 mg total) by mouth daily. 04/13/18  Yes Sela Hilding, MD  Menthol, Topical Analgesic, (BIOFREEZE EX) Apply 1 application topically daily as needed (pain).   Yes [provider]  Menthol, Topical Analgesic, (ICY HOT EX) Apply 1 application topically daily as needed (pain).   Yes [provider]  ranitidine (ZANTAC) 150 MG tablet Take 1 tablet (150 mg total) by mouth 2 (two) times daily as needed for heartburn. 04/13/18  Yes Sela Hilding, MD  traMADol (ULTRAM) 50 MG tablet Take 1 tablet (50 mg total) by mouth 2 (two) times daily. 04/13/18  Yes Sela Hilding, MD  Vitamin D, Ergocalciferol, (DRISDOL) 50000 units CAPS capsule TAKE 1 CAPSULE (50,000 UNITS TOTAL) BY MOUTH EVERY 7 (SEVEN) DAYS. 12/14/17  Yes Sela Hilding, MD     Family History Family History  Problem Relation Age of Onset  . Lung cancer Father   . Alcohol abuse Father   . Depression Mother   . Hypertension Mother   . Heart disease Mother     Social History Social History   Tobacco Use  . Smoking status: Current Some Day Smoker    Types: Cigars  . Smokeless tobacco: Never Used  . Tobacco comment: "smokes every once in a while"  Substance Use Topics  . Alcohol use: Not Currently  . Drug use: Yes    Types: Marijuana    Comment: used today     Allergies   Patient has no known allergies.   Review of Systems Review of Systems  Constitutional: Negative for fever.  Gastrointestinal: Negative for abdominal pain.  Musculoskeletal: Positive for arthralgias, back pain (unchanged from chronic) and gait problem. Negative for neck pain.  Skin: Negative for rash and wound.  Neurological: Negative for headaches.     Physical Exam Updated Vital Signs BP (!) 133/98 (BP Location: Right Arm)   Pulse 66   Temp 97.9 F (36.6 C) (Oral)   Resp (!) 22   Ht 6\' 2"  (1.88 m)   Wt (!) 149.7 kg   SpO2 98%   BMI 42.37 kg/m   Physical Exam Vitals signs and nursing note reviewed.  Constitutional:      General: He is not in acute distress (pain, holding left leg).    Appearance: He is well-developed. He is not diaphoretic.  HENT:     Head: Normocephalic and atraumatic.  Eyes:     Conjunctiva/sclera: Conjunctivae normal.  Neck:     Musculoskeletal: Normal range of motion.  Cardiovascular:     Rate and Rhythm: Normal rate and regular rhythm.  Pulmonary:     Effort: Pulmonary effort is normal. No respiratory distress.     Breath sounds: Normal breath sounds. No rhonchi.  Abdominal:     General: There is no distension.     Palpations: Abdomen is soft.     Tenderness: There is no abdominal tenderness. There is no guarding.  Musculoskeletal:     Left hip: He exhibits decreased range of motion. He exhibits no laceration.     Left knee:  He exhibits decreased range of motion, swelling and bony tenderness. He exhibits no deformity, no laceration, no erythema, no LCL laxity, normal patellar mobility and no MCL laxity. Tenderness found. Medial joint line, lateral joint line and patellar tendon tenderness noted.  Skin:    General: Skin is warm and dry.  Neurological:     Mental Status: He is alert and oriented to person, place, and time.      ED Treatments / Results  Labs (all labs ordered are listed, but only abnormal results are displayed) Labs Reviewed - No data to display  EKG None  Radiology Dg Knee Complete 4 Views Left  Result  Date: 08/08/2018 CLINICAL DATA:  Patient fell after LEFT knee gave out EXAM: LEFT KNEE - COMPLETE 4+ VIEW COMPARISON:  05/15/2018 FINDINGS: Osseous demineralization. Advanced tricompartmental osteoarthritic changes with joint space narrowing spur formation greatest at medial compartment. Probable old healed fracture of the proximal tibia. Old healed proximal fibular diaphyseal fracture. No acute fracture, dislocation, or bone destruction. Probable calcified loose body suprapatellar 12 mm diameter. IMPRESSION: Osseous demineralization with advanced tricompartmental osteoarthritic changes of LEFT knee. Prior proximal LEFT tibial and fibular fractures. No acute abnormalities. Electronically Signed   By: Lavonia Dana M.D.   On: 08/08/2018 15:55   Dg Hip Unilat W Or Wo Pelvis 2-3 Views Left  Result Date: 08/08/2018 CLINICAL DATA:  Left hip pain after fall. EXAM: DG HIP (WITH OR WITHOUT PELVIS) 2-3V LEFT COMPARISON:  None. FINDINGS: There is no evidence of hip fracture or dislocation. There is no evidence of arthropathy or other focal bone abnormality. IMPRESSION: Negative. Electronically Signed   By: Marijo Conception, M.D.   On: 08/08/2018 15:57    Procedures Procedures (including critical care time)  Medications Ordered in ED Medications  fentaNYL (SUBLIMAZE) injection 50 mcg (50 mcg Intravenous  Given 08/08/18 1510)  diazepam (VALIUM) tablet 5 mg (5 mg Oral Given 08/08/18 1541)  ketorolac (TORADOL) 30 MG/ML injection 30 mg (30 mg Intravenous Given 08/08/18 1554)     Initial Impression / Assessment and Plan / ED Course  I have reviewed the triage vital signs and the nursing notes.  Pertinent labs & imaging results that were available during my care of the patient were reviewed by me and considered in my medical decision making (see chart for details).      42 year old male with a history of severe degenerative disease of the left knee, chronic pain of multiple joints, presents with concern for fall and knee pain.  Denies any other injuries other than pain to the knee and hip. Reports primary knee giving out causing fall, not consistent with CVA or spinal emergency.  Reports he has chronic back pain which is unchanged.  Reports the pain is in his knee and radiates to his hip.  He has a strong bilateral PT and DP pulses, no sign of vascular injury.  He has chronic paresthesias related to his back, and notes paresthesias in his left foot as well.  X-rays do not show any sign of fracture of the knee or hip.  He has severe arthritis.  Patient was given fentanyl, Toradol, and Valium, and has pain that limits exam.  He is able to hold his leg up briefly and I witnessed straightening of leg, however overall when requested he is unable to hold leg up at the knee with severe pain raising concern for patellar tendon injury.  Discussed he does not have instability on my exam but this is limited and other ligamentous or meniscal injury is possible.    Given patient unable to participate in exam to reliably straighten or hold knee in extension, I will place him in knee immobilizer and give him crutches, recommend he is NWB and follow up closely with orthopedic surgery.  Reviewed in drug database, has regular rx for tramadol monthly with last fill one month ago.  Given no fracture present, recommend follow  up with PCP/orhtopedics. Offered rx for muscle relaxant or naproxen however pt states he has these.         Final Clinical Impressions(s) / ED Diagnoses   Final diagnoses:  Fall, initial encounter  Acute pain  of left knee    ED Discharge Orders    None       Gareth Morgan, MD 08/08/18 [phone]

## 2018-08-08 NOTE — ED Notes (Signed)
ED Provider at bedside. 

## 2018-08-08 NOTE — ED Notes (Addendum)
Patient transported to X-ray 

## 2018-08-08 NOTE — ED Triage Notes (Signed)
Pt states his "knee gave out" and he fell, States left knee bent underneath him and he fell backwards. C/o pain in left knee and left hip

## 2018-08-09 ENCOUNTER — Ambulatory Visit (HOSPITAL_COMMUNITY): Payer: Medicaid Other | Admitting: Licensed Clinical Social Worker

## 2018-08-14 ENCOUNTER — Other Ambulatory Visit: Payer: Self-pay | Admitting: Family Medicine

## 2018-08-17 NOTE — Telephone Encounter (Signed)
Patient hasn't seen me in 6 months and presumably hasn't been on either of these medications since then as I have not refilled since August. I will not restart them today. He should be seen if he is having worsening pain.

## 2018-08-23 ENCOUNTER — Ambulatory Visit (HOSPITAL_COMMUNITY): Payer: Medicaid Other | Admitting: Psychiatry

## 2018-09-01 ENCOUNTER — Ambulatory Visit (INDEPENDENT_AMBULATORY_CARE_PROVIDER_SITE_OTHER): Payer: Medicaid Other | Admitting: Licensed Clinical Social Worker

## 2018-09-01 ENCOUNTER — Encounter (HOSPITAL_COMMUNITY): Payer: Self-pay | Admitting: Licensed Clinical Social Worker

## 2018-09-01 DIAGNOSIS — F331 Major depressive disorder, recurrent, moderate: Secondary | ICD-10-CM | POA: Diagnosis not present

## 2018-09-01 DIAGNOSIS — F1210 Cannabis abuse, uncomplicated: Secondary | ICD-10-CM | POA: Diagnosis not present

## 2018-09-01 NOTE — Progress Notes (Signed)
   THERAPIST PROGRESS NOTE  Session Time: 2:40-3:25pm  Participation Level: Active  Behavioral Response: CasualAlert/Anxious  Type of Therapy: Individual Therapy  Treatment Goals addressed: Improve Psychiatric Symptoms, elevate mood (increased self-esteem, increased self-compassion, increased interaction), improve unhelpful thought patterns, controlled behavior, moderate mood, deliberate speech and thought process(improved social functioning, healthy adjustment to living situation), Learn about diagnosis, healthy coping skills.  Interventions: CBT  Summary: Julian Lucas is a 43 y.o. male who presents for his individual counseling session on his way to his dr appt. He reports "Im in so much pain, I sometimes want to go somewhere else and not be bothered with people." I have back, leg, ankle, knee pain." I can't work now." I can't take care of my family." Processed his feelings about his pain and plans of care. He is seeing individual doctors for his chronic pain. Discussed coping tools for dealing with pain. He reports he is not sleeping due to the pain. Pt is using marijuana as a coping tool for pain.   Suicidal/Homicidal: no/without plan  Therapist Response: Assessed pt's current functioning and reviewed progress. Assisted pt processing chronic pain, care plan, coping tool for pain.Assisted pt processing for the management of his stressors.   Plan: Return again in 2-3 weeks.  Diagnosis: Axis I: MDD, recurrent episode moderate        MACKENZIE,LISBETH S, LCAS 09/01/2018

## 2018-09-02 DIAGNOSIS — F1210 Cannabis abuse, uncomplicated: Secondary | ICD-10-CM | POA: Insufficient documentation

## 2018-09-14 ENCOUNTER — Ambulatory Visit (HOSPITAL_COMMUNITY): Payer: Medicaid Other | Admitting: Licensed Clinical Social Worker

## 2018-09-16 ENCOUNTER — Ambulatory Visit (INDEPENDENT_AMBULATORY_CARE_PROVIDER_SITE_OTHER): Payer: Medicaid Other | Admitting: Psychiatry

## 2018-09-16 ENCOUNTER — Encounter (HOSPITAL_COMMUNITY): Payer: Self-pay | Admitting: Psychiatry

## 2018-09-16 VITALS — BP 138/88 | Ht 74.0 in | Wt 324.0 lb

## 2018-09-16 DIAGNOSIS — F331 Major depressive disorder, recurrent, moderate: Secondary | ICD-10-CM

## 2018-09-16 DIAGNOSIS — F0630 Mood disorder due to known physiological condition, unspecified: Secondary | ICD-10-CM

## 2018-09-16 MED ORDER — DULOXETINE HCL 60 MG PO CPEP: 60.0000 mg | ORAL_CAPSULE | ORAL | 1 refills | Status: DC

## 2018-09-16 MED ORDER — QUETIAPINE FUMARATE 25 MG PO TABS: ORAL_TABLET | ORAL | 1 refills | Status: DC

## 2018-09-16 NOTE — Progress Notes (Addendum)
BH MD/PA/NP OP Progress Note  09/16/2018 3:42 PM Julian Lucas  MRN:  [id number]  Chief Complaint: I am not doing good.  I do not sleep.  I fell again in November and had a lot of pain in my leg.  HPI: Gosper is 43 year old African-American currently unemployed male who came for his appointment.  This is the first time I am seeing this patient.  Patient has seen Dr. Paticia Stack there followed by Dr. Parke Poisson in August.  He is taking Cymbalta 30 mg twice a day.  He continues to struggle with anxiety, depression, feeling of hopelessness and worthlessness.  He was working as a Freight forwarder at [employer] however he fell and injured his back neck knee and ankle joint.  He struggled with the pain all day.  He admitted having crying spells, isolated, withdrawn and poor sleep.  Some nights he up until 6 AM in the morning.  He worried about his finances.  He admitted irritability, anger issues and highs and lows.  I reviewed his history.  Patient admitted long history of anger and mood swing.  He used to burn his arm when he was very young.  He admitted getting easily emotional and recall did not take easy whenever break-up with girlfriend.  He admitted heavy use of cocaine and drugs.  He also admitted selling drugs and got arrested and served present from 2003-2009.  However since he came out from the prison he has not using any cocaine.  He still smoke marijuana almost regular basis to calm himself.  He gets easily frustrated.  He admitted having family issues and really seen his sister who lives in [place].  Patient lives with his girlfriend and 14-year-old daughter.  His 61 year old daughter is also visiting from [place] and is staying with him.  Patient denies any paranoia or any hallucination but admitted detached from surroundings.  He is tolerating Cymbalta and he has no tremors shakes or any EPS.  He is also taking gabapentin prescribed by his primary care doctor.  He is seeing orthopedic for his chronic pain.  His energy  level is fair.  He lost 6 pounds since the last visit.  He is trying to watch his calorie intake.  He denies any active suicidal thoughts or homicidal thought.  Visit Diagnosis:    ICD-10-CM   1. Major depressive disorder, recurrent episode, moderate (HCC) F33.1   2. Mood disorder in conditions classified elsewhere F06.30     Past Psychiatric History: Reviewed. History of mood swing, anger, emotions and self abusive behavior by burning his arm in childhood.  History of inpatient at [facility] but denies any suicidal attempt.  History of heavy drinking, using cocaine and drugs answered present time.  Seen on and off psychiatrist and prescribed Zoloft caused sexual side effects.  Past Medical History:  Past Medical History:  Diagnosis Date  . Anxiety   . Bipolar disorder ([place])   . 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 Chronic pain of multiple joints   . DEGENERATIVE JOINT DISEASE, LEFT KNEE 09/09/2010   Qualifier: Diagnosis of  By: Oneida Alar MD, KARL    . Depression   . GERD (gastroesophageal reflux disease)   . HERPES GENITALIS 11/21/2009   Qualifier: Diagnosis of  By: Jeannine Kitten MD, Rodman Key    . Hypertension   . PULMONARY EMBOLISM, HX OF 11/21/2009   Qualifier: Diagnosis of  By: Tye Savoy MD, Tommi Rumps    . UNEQUAL LEG LENGTH 08/09/2010   Qualifier: Diagnosis  of  By: Ernestina Patches MD, Remo Lipps      Past Surgical History:  Procedure Laterality Date  . ANTERIOR CERVICAL DECOMP/DISCECTOMY FUSION N/A 11/23/2017   Procedure: ANTERIOR CERVICAL DECOMPRESSION AND FUSION CERVICAL SIX-SEVEN;  Surgeon: Kary Kos, MD;  Location: [facility];  Service: Neurosurgery;  Laterality: N/A;  anterior  . KNEE SURGERY    . NECK SURGERY    . TONSILECTOMY, ADENOIDECTOMY, BILATERAL MYRINGOTOMY AND TUBES      Family Psychiatric History: Reviewed.  Family History:  Family History  Problem Relation Age of Onset  . Lung cancer Father   . Alcohol abuse Father   . Depression Mother   . Hypertension  Mother   . Heart disease Mother     Social History:  Social History   Socioeconomic History  . Marital status: Significant Other    Spouse name: Not on file  . Number of children: 2  . Years of education: Not on file  . Highest education level: High school graduate  Occupational History    Comment: unemployed  Social Needs  . Financial resource strain: Somewhat hard  . Food insecurity:    Worry: Sometimes true    Inability: Sometimes true  . Transportation needs:    Medical: No    Non-medical: No  Tobacco Use  . Smoking status: Current Some Day Smoker    Types: Cigars  . Smokeless tobacco: Never Used  . Tobacco comment: "smokes every once in a while"  Substance and Sexual Activity  . Alcohol use: Not Currently  . Drug use: Yes    Types: Marijuana    Comment: used today  . Sexual activity: Not Currently  Lifestyle  . Physical activity:    Days per week: 0 days    Minutes per session: 0 min  . Stress: Rather much  Relationships  . Social connections:    Talks on phone: Twice a week    Gets together: Never    Attends religious service: Never    Active member of club or organization: No    Attends meetings of clubs or organizations: Never    Relationship status: Not on file  Other Topics Concern  . Not on file  Social History Narrative   ** Merged History Encounter **        Allergies: No Known Allergies  Metabolic Disorder Labs: No results found for this or any previous visit (from the past 2160 hour(s)). Lab Results  Component Value Date   HGBA1C  11/01/2009    5.1 (NOTE) The ADA recommends the following therapeutic goal for glycemic control related to Hgb A1c measurement: Goal of therapy: <6.5 Hgb A1c  Reference: American Diabetes Association: Clinical Practice Recommendations 2010, Diabetes Care, 2010, 33: (Suppl  1).   MPG 100 11/01/2009   No results found for: PROLACTIN Lab Results  Component Value Date   CHOL 183 11/21/2009   TRIG 97 11/21/2009    HDL 47 11/21/2009   CHOLHDL 3.9 Ratio 11/21/2009   VLDL 19 11/21/2009   LDLCALC 117 (H) 11/21/2009   Lab Results  Component Value Date   TSH 0.551 09/14/2017   TSH  10/31/2009    0.867 (NOTE)  Please note change in reference ranges for ages 65W to 51Y.  Test methodology is 3rd generation TSH    Therapeutic Level Labs: No results found for: LITHIUM No results found for: VALPROATE No components found for:  CBMZ  Current Medications: Current Outpatient Medications  Medication Sig Dispense Refill  . cyclobenzaprine (FLEXERIL) 10  MG tablet TAKE 1 TABLET BY MOUTH AT BEDTIME AS NEEDED FOR MUSCLE SPASMS 30 tablet 1  . DULoxetine (CYMBALTA) 30 MG capsule TAKE 1-2 CAPSULES (30-60 MG TOTAL) BY MOUTH 2 (TWO) TIMES DAILY. 60 capsule 0  . gabapentin (NEURONTIN) 300 MG capsule TAKE 1 CAPSULE BY MOUTH THREE TIMES A DAY 270 capsule 1  . losartan (COZAAR) 25 MG tablet Take 1 tablet (25 mg total) by mouth daily. 90 tablet 1  . Menthol, Topical Analgesic, (BIOFREEZE EX) Apply 1 application topically daily as needed (pain).    . Menthol, Topical Analgesic, (ICY HOT EX) Apply 1 application topically daily as needed (pain).    . ranitidine (ZANTAC) 150 MG tablet Take 1 tablet (150 mg total) by mouth 2 (two) times daily as needed for heartburn. 60 tablet 1  . traMADol (ULTRAM) 50 MG tablet Take 1 tablet (50 mg total) by mouth 2 (two) times daily. 120 tablet 0  . Vitamin D, Ergocalciferol, (DRISDOL) 50000 units CAPS capsule TAKE 1 CAPSULE (50,000 UNITS TOTAL) BY MOUTH EVERY 7 (SEVEN) DAYS. 8 capsule 0   No current facility-administered medications for this visit.      Musculoskeletal: Strength & Muscle Tone: decreased Gait & Station: unsteady, Difficulty walking.  Need cane to help his balance. Patient leans: Right  Psychiatric Specialty Exam: Review of Systems  Constitutional: Positive for malaise/fatigue.  Musculoskeletal: Positive for back pain, joint pain and neck pain.  Neurological:  Positive for tingling.  Psychiatric/Behavioral: Positive for depression. The patient is nervous/anxious and has insomnia.     Blood pressure 138/88, height 6\' 2"  (1.88 m), weight (!) 324 lb (147 kg).Body mass index is 41.6 kg/m.  General Appearance: Fairly Groomed  Eye Contact:  Fair  Speech:  Slow  Volume:  Decreased  Mood:  Depressed, Dysphoric and Irritable  Affect:  Constricted and Depressed  Thought Process:  Goal Directed  Orientation:  Full (Time, Place, and Person)  Thought Content: Rumination   Suicidal Thoughts:  No  Homicidal Thoughts:  No  Memory:  Immediate;   Good Recent;   Good Remote;   Good  Judgement:  Fair  Insight:  Fair  Psychomotor Activity:  Decreased  Concentration:  Concentration: Fair and Attention Span: Fair  Recall:  Good  Fund of Knowledge: Good  Language: Good  Akathisia:  No  Handed:  Right  AIMS (if indicated): not done  Assets:  Communication Skills Desire for Improvement Housing Social Support  ADL's:  Intact  Cognition: WNL  Sleep:  Poor   Screenings: PHQ2-9     Office Visit from 02/08/2018 in [facility] Office Visit from 09/10/2017 in [facility] Office Visit from 07/27/2017 in [facility] Office Visit from 04/17/2017 in [facility] Office Visit from 12/29/2016 in [facility]  PHQ-2 Total Score  0  0  0  0  0  PHQ-9 Total Score  -  -  -  -  0       Assessment and Plan: Emersen is 43 year old African-American male who came for his appointment.  I reviewed his medical record, current medication, blood work results and collateral information from previous visits.  He is taking Cymbalta 60 mg but he continues endorse poor sleep, racing thoughts, depression and irritability.  Most of his symptoms are due to his chronic pain and lack of mobility and unable to go back to work.  He has no tremors shakes or any  EPS.  He is seeing Charolotte Eke for therapy.  We discussed his cannabis use.  He minimizes his use and does not feel that cannabis use causing any issues.  I recommended to try low-dose Seroquel 25 mg at bedtime.  We talked about side effect specially metabolic syndrome.  Patient agreed to give a try.  I encouraged to continue therapy with Charolotte Eke.  He is taking Cymbalta prescribed by other provider.  However sometime he does not take as prescribed.  I encouraged to take the gabapentin as prescribed.  Continue Cymbalta 60 mg in the morning.  Discussed safety risk that anytime having suicidal thoughts or homicidal thought that he need to call 911 or go to [facility].  I will see him again in 2 months.   Kathlee Nations, MD 09/16/2018, 3:42 PM

## 2018-09-28 ENCOUNTER — Encounter (HOSPITAL_COMMUNITY): Payer: Self-pay | Admitting: Licensed Clinical Social Worker

## 2018-09-28 ENCOUNTER — Ambulatory Visit (INDEPENDENT_AMBULATORY_CARE_PROVIDER_SITE_OTHER): Payer: Medicaid Other | Admitting: Licensed Clinical Social Worker

## 2018-09-28 DIAGNOSIS — F331 Major depressive disorder, recurrent, moderate: Secondary | ICD-10-CM | POA: Diagnosis not present

## 2018-09-28 DIAGNOSIS — F1210 Cannabis abuse, uncomplicated: Secondary | ICD-10-CM | POA: Diagnosis not present

## 2018-09-28 NOTE — Progress Notes (Signed)
   THERAPIST PROGRESS NOTE  Session Time: 3:10-4pm  Participation Level: Active  Behavioral Response: CasualAlert/Anxious  Type of Therapy: Individual Therapy  Treatment Goals addressed: Improve Psychiatric Symptoms, elevate mood (increased self-esteem, increased self-compassion, increased interaction), improve unhelpful thought patterns, controlled behavior, moderate mood, deliberate speech and thought process(improved social functioning, healthy adjustment to living situation), Learn about diagnosis, healthy coping skills.  Interventions: CBT/TX plan review  Summary: Julian Lucas is a 43 y.o. male who presents for his individual counseling session on his way to his dr appt. He still reports "Im in so much pain, I sometimes want to go somewhere else and not be bothered with people." I have back, leg, ankle, knee pain." I can't work now." I can't take care of my family." Pt still reports the previous statements. Reviewed tx plan with pt with signature. Pt has seen a neurosurgeon who has a tx plan for his back but medicaid is deciding what they'll pay for. Pt saw Dr. Adele Schilder, psychiatrist for initial appointment. He added Seroquel for sleep and increased Cymbalta to 60 mg. Pt is using marijuana as a coping tool.    Suicidal/Homicidal: no/without plan  Therapist Response: Assessed pt's current functioning and reviewed progress. Assisted pt processing chronic pain, care plan, coping tool for pain.Assisted pt processing for the management of his stressors.   Plan: Return again in 2-3 weeks.  Diagnosis: Axis I: MDD, recurrent episode moderate        MACKENZIE,LISBETH S, LCAS 09/28/2018

## 2018-10-02 ENCOUNTER — Other Ambulatory Visit: Payer: Self-pay | Admitting: Student

## 2018-10-02 DIAGNOSIS — R29898 Other symptoms and signs involving the musculoskeletal system: Secondary | ICD-10-CM

## 2018-10-12 ENCOUNTER — Other Ambulatory Visit: Payer: Self-pay

## 2018-10-12 ENCOUNTER — Ambulatory Visit
Admission: RE | Admit: 2018-10-12 | Discharge: 2018-10-12 | Disposition: A | Discharge status: Home / Self Care | Payer: Medicaid Other | Source: Ambulatory Visit | Attending: Student | Admitting: Student

## 2018-10-12 ENCOUNTER — Encounter: Payer: Self-pay | Admitting: Family Medicine

## 2018-10-12 ENCOUNTER — Ambulatory Visit (INDEPENDENT_AMBULATORY_CARE_PROVIDER_SITE_OTHER): Payer: Medicaid Other | Admitting: Family Medicine

## 2018-10-12 ENCOUNTER — Ambulatory Visit (HOSPITAL_COMMUNITY): Payer: Medicaid Other | Admitting: Licensed Clinical Social Worker

## 2018-10-12 ENCOUNTER — Telehealth: Payer: Self-pay

## 2018-10-12 VITALS — BP 112/70 | HR 76 | Temp 98.1°F | Wt 331.0 lb

## 2018-10-12 DIAGNOSIS — R29898 Other symptoms and signs involving the musculoskeletal system: Secondary | ICD-10-CM

## 2018-10-12 DIAGNOSIS — R0609 Other forms of dyspnea: Secondary | ICD-10-CM | POA: Diagnosis present

## 2018-10-12 MED ORDER — LOSARTAN POTASSIUM 25 MG PO TABS: 25.0000 mg | ORAL_TABLET | Freq: Every day | ORAL | 1 refills | Status: DC

## 2018-10-12 MED ORDER — GABAPENTIN 300 MG PO CAPS: ORAL_CAPSULE | ORAL | 1 refills | Status: DC

## 2018-10-12 MED ORDER — TRAMADOL HCL 50 MG PO TABS: 50.0000 mg | ORAL_TABLET | Freq: Two times a day (BID) | ORAL | 0 refills | Status: DC | PRN

## 2018-10-12 NOTE — Progress Notes (Signed)
   CC: cough, knee pain   HPI  Cough - present for about 3 weeks, worse at night. Has tried niquil and robutissin. Sometimes yellow sputum. No cigarettes, smokes 10 blounts per day. Maybe had a cold about a month ago, with runny nose. No fevers. No chills. More SOB when walking after his second fall, which about November. DOE, needs to stop after walking down the hallway. No CP. Feels "clogged up." no hx of CHF. Had back MRI tonight. Hearing wheezing maybe more at night. Was diagnosed with sleep apnea at about 43 years old. They did T+A, he also had a CPAP machine at this time. Drinks lots of water now. One more pillow than before, although this has 8 months or some. No PND that he can appreciate. No heartburn sxs.   Hypertension: He is taking losartan daily.  Not checking blood pressures at home.  Left knee pain - chronic, using gabapentin TID, out of flexeril, using tramadol QOD when he has to go out and about. No change in quality of pain.   ROS: Denies CP, +SOB, abdominal pain, dysuria, changes in BMs.   CC, SH/smoking status, and VS noted  Objective: BP 112/70   Pulse 76   Temp 98.1 F (36.7 C) (Oral)   Wt (!) 331 lb (150.1 kg)   SpO2 98%   BMI 42.50 kg/m  Gen: NAD, alert, cooperative, and pleasant. HEENT: NCAT, EOMI, PERRL CV: RRR, no murmur Resp: CTAB, no wheezes, non-labored Abd: SNTND, BS present, no guarding or organomegaly Ext: No edema, warm Neuro: Alert and oriented, Speech clear, No gross deficits  Assessment and plan:  Cough: Wide differential for this, possibly post viral with the questionable URI about a month ago.  However, we must also consider lung pathology given his long history of smoking.  Additionally, with his obesity and smoking he is at risk for CHF.  Low suspicion of CHF given lack of lower extremity edema or PND.  However his dyspnea on exertion is concerning.  Will start with CBC, CMP, TSH, chest x-ray.  May need to consider PFTs in the future if all  these are normal.  I will call him with results.  Hypertension: Continue losartan.  Check BMP as above.  Left knee pain: Continue gabapentin, DC Flexeril, continue tramadol.  Database reviewed.  Ralene Ok, MD, PGY3 10/12/2018 1:41 PM

## 2018-10-12 NOTE — Patient Instructions (Signed)
It was a pleasure to see you today! Thank you for choosing [facility] for your primary care. Julian Lucas was seen for cough, blood pressure.   Our plans for today were:  I sent your refills.   We will call you about your blood work.   Get your chest xray tonight.   Best,  Dr. Lindell Noe

## 2018-10-12 NOTE — Telephone Encounter (Signed)
Received fax from [place] requesting prior authorization of Tramadol . Completed PA info in [place] for Tramadol. Status pending.  Will recheck status in 24 hours. Esau Grew, RN

## 2018-10-13 LAB — CBC
Hematocrit: 42.7 % (ref 37.5–51.0)
Hemoglobin: 14.6 g/dL (ref 13.0–17.7)
MCH: 30.7 pg (ref 26.6–33.0)
MCHC: 34.2 g/dL (ref 31.5–35.7)
MCV: 90 fL (ref 79–97)
PLATELETS: 224 10*3/uL (ref 150–450)
RBC: 4.76 x10E6/uL (ref 4.14–5.80)
RDW: 11.9 % (ref 11.6–15.4)
WBC: 5.1 10*3/uL (ref 3.4–10.8)

## 2018-10-13 LAB — CMP14+EGFR
A/G RATIO: 1.4 (ref 1.2–2.2)
ALBUMIN: 4 g/dL (ref 4.0–5.0)
ALK PHOS: 102 IU/L (ref 39–117)
ALT: 17 IU/L (ref 0–44)
AST: 14 IU/L (ref 0–40)
BILIRUBIN TOTAL: 0.7 mg/dL (ref 0.0–1.2)
BUN / CREAT RATIO: 19 (ref 9–20)
BUN: 15 mg/dL (ref 6–24)
CO2: 24 mmol/L (ref 20–29)
CREATININE: 0.81 mg/dL (ref 0.76–1.27)
Calcium: 9.3 mg/dL (ref 8.7–10.2)
Chloride: 105 mmol/L (ref 96–106)
GFR calc Af Amer: 128 mL/min/{1.73_m2} (ref >59)
GFR calc non Af Amer: 110 mL/min/{1.73_m2} (ref >59)
GLOBULIN, TOTAL: 2.9 g/dL (ref 1.5–4.5)
Glucose: 84 mg/dL (ref 65–99)
POTASSIUM: 4.3 mmol/L (ref 3.5–5.2)
SODIUM: 143 mmol/L (ref 134–144)
Total Protein: 6.9 g/dL (ref 6.0–8.5)

## 2018-10-13 LAB — TSH: TSH: 0.838 u[IU]/mL (ref 0.450–4.500)

## 2018-10-13 NOTE — Telephone Encounter (Signed)
Prior approval for Tramadol completed via [employer].  Med approved for 10/12/18 - 04/10/19  Prior approval # [id number]. [facility].  Esau Grew, RN

## 2018-10-15 ENCOUNTER — Ambulatory Visit
Admission: RE | Admit: 2018-10-15 | Discharge: 2018-10-15 | Disposition: A | Discharge status: Home / Self Care | Payer: Medicaid Other | Source: Ambulatory Visit | Attending: Family Medicine | Admitting: Family Medicine

## 2018-10-15 ENCOUNTER — Telehealth: Payer: Self-pay | Admitting: *Deleted

## 2018-10-15 DIAGNOSIS — R0609 Other forms of dyspnea: Principal | ICD-10-CM

## 2018-10-15 NOTE — Telephone Encounter (Signed)
-----   Message from Sela Hilding, MD sent at 10/15/2018  9:51 AM EST ----- Called patient and left generic VM as there are no identifiers. If he returns call:  1. His labs do not show any concerning causes for his cough and are normal.  2. It looks like he didn't end up getting his chest Xray. Can you ask him what happened and if he could go get it soon?

## 2018-10-15 NOTE — Telephone Encounter (Signed)
Informed pt of below and he stated that he thought he had to wait for an appointment to get his xrays, I told him he did not need an appointment and that he could go at his earliest convenience. Routing to PCP as an Pharmacist, hospital. Katharina Caper, Kamden Stanislaw D, [place]

## 2018-10-18 ENCOUNTER — Ambulatory Visit (INDEPENDENT_AMBULATORY_CARE_PROVIDER_SITE_OTHER): Payer: Medicaid Other | Admitting: Licensed Clinical Social Worker

## 2018-10-18 ENCOUNTER — Encounter (HOSPITAL_COMMUNITY): Payer: Self-pay | Admitting: Licensed Clinical Social Worker

## 2018-10-18 DIAGNOSIS — F1210 Cannabis abuse, uncomplicated: Secondary | ICD-10-CM | POA: Diagnosis not present

## 2018-10-18 DIAGNOSIS — F331 Major depressive disorder, recurrent, moderate: Secondary | ICD-10-CM | POA: Diagnosis not present

## 2018-10-18 NOTE — Progress Notes (Signed)
   THERAPIST PROGRESS NOTE  Session Time: 3:10-4pm  Participation Level: Active  Behavioral Response: CasualAlert/Anxious  Type of Therapy: Individual Therapy  Treatment Goals addressed: Improve Psychiatric Symptoms, elevate mood (increased self-esteem, increased self-compassion, increased interaction), improve unhelpful thought patterns, controlled behavior, moderate mood, deliberate speech and thought process(improved social functioning, healthy adjustment to living situation), Learn about diagnosis, healthy coping skills.  Interventions: CBT/TX plan review  Summary: Julian Lucas is a 43 y.o. male who presents for his individual counseling session. He still reports "Im in so much pain!"" I have back, leg, ankle, knee pain." I can't work now." I can't take care of my family." Processed with pt his feelings surrounding not being able to take care of his family."  Discussed respect vs power in his home. Pt reports he goes to the neurologist tomorrow and is hopeful for back surgery, already had MRI. Suggested pt to continue with self care during his period of chronic pain. Pt continues to use marijuana as a coping tool.    Suicidal/Homicidal: no/without plan  Therapist Response: Assessed pt's current functioning and reviewed progress. Assisted pt processing chronic pain, self care, dr appt,, coping tool for pain.Assisted pt processing for the management of his stressors.   Plan: Return again in 2-3 weeks.  Diagnosis: Axis I: MDD, recurrent episode moderate        MACKENZIE,LISBETH S, LCAS 10/18/2018

## 2018-10-19 ENCOUNTER — Other Ambulatory Visit: Payer: Self-pay | Admitting: Neurosurgery

## 2018-10-20 ENCOUNTER — Telehealth: Payer: Self-pay | Admitting: Family Medicine

## 2018-10-20 NOTE — Telephone Encounter (Addendum)
Called patient to review chest x-ray, which was unremarkable.  I do have continued concern for COPD given his copious marijuana use.  I reviewed that our next step would be PFTs with Dr. Valentina Lucks.  Helped him schedule this appointment, will forward to Dr. Valentina Lucks for [facility].

## 2018-10-28 ENCOUNTER — Ambulatory Visit (INDEPENDENT_AMBULATORY_CARE_PROVIDER_SITE_OTHER): Payer: Medicaid Other | Admitting: Psychiatry

## 2018-10-28 ENCOUNTER — Other Ambulatory Visit: Payer: Self-pay

## 2018-10-28 ENCOUNTER — Encounter (HOSPITAL_COMMUNITY): Payer: Self-pay | Admitting: Psychiatry

## 2018-10-28 VITALS — BP 146/100 | HR 61 | Resp 18 | Wt 334.0 lb

## 2018-10-28 DIAGNOSIS — F1210 Cannabis abuse, uncomplicated: Secondary | ICD-10-CM | POA: Diagnosis not present

## 2018-10-28 DIAGNOSIS — F331 Major depressive disorder, recurrent, moderate: Secondary | ICD-10-CM | POA: Diagnosis not present

## 2018-10-28 DIAGNOSIS — F0630 Mood disorder due to known physiological condition, unspecified: Secondary | ICD-10-CM

## 2018-10-28 MED ORDER — DULOXETINE HCL 60 MG PO CPEP: 60.0000 mg | ORAL_CAPSULE | ORAL | 0 refills | Status: DC

## 2018-10-28 MED ORDER — QUETIAPINE FUMARATE 25 MG PO TABS: 25.0000 mg | ORAL_TABLET | Freq: Every day | ORAL | 0 refills | Status: DC
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
## 2018-11-02 ENCOUNTER — Other Ambulatory Visit: Payer: Self-pay

## 2018-11-02 ENCOUNTER — Encounter (HOSPITAL_COMMUNITY)
Admission: RE | Admit: 2018-11-02 | Discharge: 2018-11-02 | Disposition: A | Discharge status: Home / Self Care | Payer: Medicaid Other | Source: Ambulatory Visit | Attending: Neurological Surgery | Admitting: Neurological Surgery

## 2018-11-02 ENCOUNTER — Encounter (HOSPITAL_COMMUNITY): Payer: Self-pay

## 2018-11-02 DIAGNOSIS — Z01812 Encounter for preprocedural laboratory examination: Secondary | ICD-10-CM | POA: Diagnosis present

## 2018-11-02 HISTORY — DX: Family history of other specified conditions: Z84.89

## 2018-11-02 LAB — BASIC METABOLIC PANEL
Anion gap: 7 (ref 5–15)
BUN: 14 mg/dL (ref 6–20)
CO2: 24 mmol/L (ref 22–32)
Calcium: 9.3 mg/dL (ref 8.9–10.3)
Chloride: 109 mmol/L (ref 98–111)
Creatinine, Ser: 0.82 mg/dL (ref 0.61–1.24)
GFR calc Af Amer: >60 mL/min (ref >60)
GFR calc non Af Amer: >60 mL/min (ref >60)
Glucose, Bld: 86 mg/dL (ref 70–99)
Potassium: 3.9 mmol/L (ref 3.5–5.1)
Sodium: 140 mmol/L (ref 135–145)

## 2018-11-02 LAB — CBC
HCT: 44.8 % (ref 39.0–52.0)
Hemoglobin: 14.3 g/dL (ref 13.0–17.0)
MCH: 30 pg (ref 26.0–34.0)
MCHC: 31.9 g/dL (ref 30.0–36.0)
MCV: 93.9 fL (ref 80.0–100.0)
Platelets: 218 10*3/uL (ref 150–400)
RBC: 4.77 MIL/uL (ref 4.22–5.81)
RDW: 12.2 % (ref 11.5–15.5)
WBC: 5.2 10*3/uL (ref 4.0–10.5)
nRBC: 0 % (ref 0.0–0.2)

## 2018-11-02 LAB — SURGICAL PCR SCREEN
MRSA, PCR: NEGATIVE
Staphylococcus aureus: NEGATIVE

## 2018-11-02 NOTE — Pre-Procedure Instructions (Signed)
Julian Lucas  11/02/2018      [id number] [address], [place] [address] Phone: [phone] Fax: [phone]    Your procedure is scheduled on March 18  Report to [place] at 1030 A.M.  Call this number if you have problems the morning of surgery:  [phone]   Remember:  Do not eat or drink after midnight.    Take these medicines the morning of surgery with A SIP OF WATER  cyclobenzaprine (FLEXERIL)  DULoxetine (CYMBALTA) gabapentin (NEURONTIN)  ranitidine (ZANTAC)  If needed traMADol (ULTRAM) if needed  7 days prior to surgery STOP taking any Aspirin (unless otherwise instructed by your surgeon), Aleve, Naproxen, Ibuprofen, Motrin, Advil, Goody's, BC's, all herbal medications, fish oil, and all vitamins.     Do not wear jewelry  Do not wear lotions, powders, or cologne, or deodorant.    Men may shave face and neck.  Do not bring valuables to the hospital.  [facility] is not responsible for any belongings or valuables.  Contacts, dentures or bridgework may not be worn into surgery.  Leave your suitcase in the car.  After surgery it may be brought to your room.  For patients admitted to the hospital, discharge time will be determined by your treatment team.  Patients discharged the day of surgery will not be allowed to drive home.    Special instructions:   [facility]- Preparing For Surgery  Before surgery, you can play an important role. Because skin is not sterile, your skin needs to be as free of germs as possible. You can reduce the number of germs on your skin by washing with CHG (chlorahexidine gluconate) Soap before surgery.  CHG is an antiseptic cleaner which kills germs and bonds with the skin to continue killing germs even after washing.    Oral Hygiene is also important to reduce your risk of infection.  Remember - BRUSH YOUR TEETH THE MORNING OF SURGERY WITH YOUR  REGULAR TOOTHPASTE  Please do not use if you have an allergy to CHG or antibacterial soaps. If your skin becomes reddened/irritated stop using the CHG.  Do not shave (including legs and underarms) for at least 48 hours prior to first CHG shower. It is OK to shave your face.  Please follow these instructions carefully.   1. Shower the NIGHT BEFORE SURGERY and the MORNING OF SURGERY with CHG.   2. If you chose to wash your hair, wash your hair first as usual with your normal shampoo.  3. After you shampoo, rinse your hair and body thoroughly to remove the shampoo.  4. Use CHG as you would any other liquid soap. You can apply CHG directly to the skin and wash gently with a scrungie or a clean washcloth.   5. Apply the CHG Soap to your body ONLY FROM THE NECK DOWN.  Do not use on open wounds or open sores. Avoid contact with your eyes, ears, mouth and genitals (private parts). Wash Face and genitals (private parts)  with your normal soap.  6. Wash thoroughly, paying special attention to the area where your surgery will be performed.  7. Thoroughly rinse your body with warm water from the neck down.  8. DO NOT shower/wash with your normal soap after using and rinsing off the CHG Soap.  9. Pat yourself dry with a CLEAN TOWEL.  10. Wear CLEAN PAJAMAS to bed the night before surgery, wear  comfortable clothes the morning of surgery  11. Place CLEAN SHEETS on your bed the night of your first shower and DO NOT SLEEP WITH PETS.    Day of Surgery:  Do not apply any deodorants/lotions.  Please wear clean clothes to the hospital/surgery center.   Remember to brush your teeth WITH YOUR REGULAR TOOTHPASTE.    Please read over the following fact sheets that you were given.

## 2018-11-02 NOTE — Progress Notes (Signed)
PCP - Elijio Miles Cardiologist - denies  Chest x-ray - 10/15/18 EKG - 10/15/18 Stress Test - denies ECHO - denies Cardiac Cath - denies  Sleep Study - age 43 CPAP - no  Anesthesia review: yes, c/o cough at PAT appointment.  Patient stated that he is seeing his PCP this week before Thursday to be evaluated for the cough.  Patient denies any fevers or chest congestion.     Patient denies shortness of breath, fever, cough and chest pain at PAT appointment   Patient verbalized understanding of instructions that were given to them at the PAT appointment. Patient was also instructed that they will need to review over the PAT instructions again at home before surgery.

## 2018-11-03 ENCOUNTER — Telehealth: Payer: Self-pay | Admitting: *Deleted

## 2018-11-03 MED ORDER — AMBULATORY NON FORMULARY MEDICATION: 0 refills | Status: DC

## 2018-11-03 NOTE — Progress Notes (Addendum)
Anesthesia Chart Review:  Case:  [id number] Date/Time:  11/10/18 1215   Procedure:  Laminectomy and Foraminotomy - L3-L4 - L4-L5 - L5-S1, laminectomy and microdiskectomy T10-T11 - left (Left Back)   Anesthesia type:  General   Pre-op diagnosis:  Stenosis   Location:  [facility] OR ROOM 19 / [place] OR   Surgeon:  Kary Kos, MD      DISCUSSION: Patient is a 43 year old male scheduled for the above procedure.   History includes smoking (tobacco, marijuana), HTN, PE (post-op 11/06/09), GERD, Bipolar disorder. S/p C4-6 fusion 10/31/09, C6-7 ACDF 11/23/17. BMI is consistent with morbid obesity.   He saw his PCP Dr. Lindell Noe on 10/12/18 for persistent cough x3 weeks. Possible post-viral given URI a month prior. She had a low suspicion for CHF given lack of edema and PND. Labs and CXR ordered given DOE. CXR and labs okay, so PFTs ordered and are scheduled for 11/04/18.   Per PAT RN notation, patient still with cough without fever or chest congestion. Anesthesia APP not consulted by RN to evaluate patient, but her notes indicates she thought he was scheduled for re-evaluation by his PCP on 11/04/18. It looks like he is only scheduled for PFTs. I will reach out to Dr. Lindell Noe for preoperative input given his on-going evaluation.    ADDENDUM 11/05/18 2:01 PM:  I have spoken with patient and PCP Dr. Lindell Noe today. Patient reports his coughing has improved. Has a "sporadic" dry cough, and denied any prolonged coughing spells. No known wheezing. He refers to it as a "smoker's cough." His 11/04/18 spirometry was normal.    Per Dr. Lindell Noe, "Regarding his upcoming surgery, Mr. Mcbreen has low pulmonary risk for a moderate risk surgery given his recent reassuring workup regarding his cough. He also has low cardiac risk based on last EKG and my exam. I am also reassured risk wise by the fact that he tolerated a similar surgery within the last year. I am happy to follow up with him after his surgery for cough concerns."  I  have updated [name] at Dr. Windy Carina office. Based on currently available information, I would anticipate that he can proceed as planned if no acute changes.   VS: BP 129/89   Pulse 65   Temp 36.8 C   Resp 20   Ht 6\' 2"  (1.88 m)   Wt (!) 148.4 kg   SpO2 98%   BMI 42.00 kg/m    PROVIDERS: Sela Hilding, MD is PCP. Last visit 10/12/18 for cough and knee pain.  [name], MD is psychiatrist.   LABS: Labs reviewed: Acceptable for surgery. (all labs ordered are listed, but only abnormal results are displayed)  Labs Reviewed  SURGICAL PCR SCREEN  BASIC METABOLIC PANEL  CBC    Spirometry 11/04/18: FVC 4.73 (94.9%) FEV1 3.95 (97.4%) FEV1/FVC 83.5 (102.2%) [id number] 419 (102.2%) Normal spirometry.   IMAGES: CXR 10/15/18: IMPRESSION: No acute abnormality of the lungs.  No focal airspace opacity.  MRI C/T/L Spine 10/12/18: IMPRESSION: 1. Stable degenerative and postoperative changes in the cervical spine aside from new mild degenerative endplate edema at I3-4 and C7-T1. 2. Moderate to severe spinal stenosis at C2-3 and moderate spinal stenosis at C3-4. 3. Moderate neural foraminal stenosis on the right at C6-7 and on the left at C7-T1. 4. Unchanged cervical spinal cord myelomalacia. 5. Thoracic disc degeneration without significant interval change, most notable at T10-11 where there is mild spinal stenosis from a left paracentral disc protrusion. 6. Unchanged lumbar disc degeneration  and posterior element hypertrophy resulting in moderate to severe spinal stenosis at L3-4 and L4-5. 7. Severe left and moderate right neural foraminal stenosis at L5-S1 and moderate biforaminal stenosis at L4-5.   EKG: 11/16/17: Sinus bradycardia Nonspecific T wave abnormality Abnormal ekg Confirmed by Ida Rogue [phone]) on 11/16/2017 10:01:56 PM   CV: N/A   Past Medical History:  Diagnosis Date  . Anxiety   . Bipolar disorder ([employer])   . Cervical disc disorder with  myelopathy of cervical region   . Chronic pain of multiple joints   . DEGENERATIVE JOINT DISEASE, LEFT KNEE 09/09/2010   Qualifier: Diagnosis of  By: Oneida Alar MD, KARL    . Depression   . Family history of adverse reaction to anesthesia    mom takes a long time to wake up  . GERD (gastroesophageal reflux disease)   . HERPES GENITALIS 11/21/2009   Qualifier: Diagnosis of  By: Jeannine Kitten MD, Rodman Key    . Hypertension   . PULMONARY EMBOLISM, HX OF 11/21/2009   Qualifier: Diagnosis of  By: Tye Savoy MD, Tommi Rumps    . UNEQUAL LEG LENGTH 08/09/2010   Qualifier: Diagnosis of  By: Ernestina Patches MD, Remo Lipps      Past Surgical History:  Procedure Laterality Date  . ANTERIOR CERVICAL DECOMP/DISCECTOMY FUSION N/A 11/23/2017   Procedure: ANTERIOR CERVICAL DECOMPRESSION AND FUSION CERVICAL SIX-SEVEN;  Surgeon: Kary Kos, MD;  Location: [place];  Service: Neurosurgery;  Laterality: N/A;  anterior  . KNEE SURGERY    . NECK SURGERY    . TONSILECTOMY, ADENOIDECTOMY, BILATERAL MYRINGOTOMY AND TUBES      MEDICATIONS: . AMBULATORY NON FORMULARY MEDICATION  . cyclobenzaprine (FLEXERIL) 10 MG tablet  . DULoxetine (CYMBALTA) 60 MG capsule  . gabapentin (NEURONTIN) 300 MG capsule  . losartan (COZAAR) 25 MG tablet  . QUEtiapine (SEROQUEL) 25 MG tablet  . ranitidine (ZANTAC) 150 MG tablet  . traMADol (ULTRAM) 50 MG tablet  . Vitamin D, Ergocalciferol, (DRISDOL) 50000 units CAPS capsule   No current facility-administered medications for this encounter.     Myra Gianotti, PA-C Surgical Short Stay/Anesthesiology [facility] Phone [phone] [facility] Phone [phone] 11/03/2018 12:30 PM

## 2018-11-03 NOTE — Telephone Encounter (Signed)
Rx written and left for patient to pick-up

## 2018-11-04 ENCOUNTER — Other Ambulatory Visit: Payer: Self-pay

## 2018-11-04 ENCOUNTER — Ambulatory Visit (INDEPENDENT_AMBULATORY_CARE_PROVIDER_SITE_OTHER): Payer: Medicaid Other | Admitting: Pharmacist

## 2018-11-04 ENCOUNTER — Encounter: Payer: Self-pay | Admitting: Pharmacist

## 2018-11-04 DIAGNOSIS — F17200 Nicotine dependence, unspecified, uncomplicated: Secondary | ICD-10-CM

## 2018-11-04 NOTE — Progress Notes (Signed)
Patient ID: Julian Lucas, male   DOB: [birth year], 43 y.o.   MRN: [id number] Reviewed: Agree with Dr. Graylin Shiver documentation and management.

## 2018-11-04 NOTE — Assessment & Plan Note (Signed)
Patient with persistent cough and concern for COPD given copious marijuana use, mixed with tobacco (Black and Milds). Spirometry evaluation reveals nearl normal lung function.  -no changes in medications  -Educated patient on smoking cessation regarding tobacco products such as cigars, "black and milds" and marijuana.   -Reviewed results of pulmonary function tests. Pt verbalized understanding of results and education.

## 2018-11-04 NOTE — Patient Instructions (Signed)
Thanks for coming in today.   Lung function tests are "normal"  Please strongly consider stopping any tobacco intake.   If your allergies bother you,  Loratadine (Claritin) Cetirizine (Zyrtec)  Fexofenadine (Allegra)'  Follow-up with Dr. Lindell Noe as needed.

## 2018-11-04 NOTE — Progress Notes (Signed)
   S:    Patient arrives with ambulation assistance with cane.    Presents for lung function evaluation.  Patient was referred by PCP Dr. Lindell Noe (referred on 10/20/2018).   Patient reports breathing has been relatively normal, however reports allergies and cough lasting over the past month.   Patient reports adherence to medications  Sleep Apnea Since ~ age 43   O: Physical Exam Psychiatric:        Mood and Affect: Mood normal.     Review of Systems  Respiratory: Positive for cough and shortness of breath.   All other systems reviewed and are negative.  See "scanned report" or Documentation Flowsheet (discrete results - PFTs) for Spirometry results. Patient provided good effort while attempting spirometry.   Lung Age = 31   A/P: Patient with persistent cough and concern for COPD given copious marijuana use, mixed with tobacco (Black and Milds). Spirometry evaluation reveals nearl normal lung function.  -no changes in medications  -Educated patient on smoking cessation regarding tobacco products such as cigars, "black and milds" and marijuana.   -Reviewed results of pulmonary function tests. Pt verbalized understanding of results and education.  Allergy symptoms of congestion and post-nasal drip: Recommended OTC options of loratidine, cetirizine, or fexofenadine as possible options to address allergy needs.      Written pt instructions provided. F/U Clinic visit as needed.    Total time in face to face counseling 45 minutes.  Patient seen with Laretta Bolster PharmD Candidate and Janeann Forehand, PharmD.

## 2018-11-05 ENCOUNTER — Telehealth: Payer: Self-pay | Admitting: Family Medicine

## 2018-11-05 NOTE — Telephone Encounter (Signed)
Called Alison back. I did receive her staff message on 11/03/2018 regarding this question, but I was awaiting his PFT results from last afternoon. I will route this back to her as well.   Regarding his upcoming surgery, Mr. Saha has low pulmonary risk for a moderate risk surgery given his recent reassuring workup regarding his cough. He also has low cardiac risk based on last EKG and my exam. I am also reassured risk wise by the fact that he tolerated a similar surgery within the last year. I am happy to follow up with him after his surgery for cough concerns.   Ralene Ok, MD

## 2018-11-05 NOTE — Anesthesia Preprocedure Evaluation (Addendum)
Anesthesia Evaluation  Patient identified by MRN, date of birth, ID band Patient awake    Reviewed: Allergy & Precautions, H&P , NPO status , Patient's Chart, lab work & pertinent test results  Airway Mallampati: II  TM Distance: >3 FB Neck ROM: Full    Dental no notable dental hx. (+) Teeth Intact, Dental Advisory Given   Pulmonary Current Smoker,    Pulmonary exam normal breath sounds clear to auscultation       Cardiovascular hypertension, Pt. on medications  Rhythm:Regular Rate:Normal     Neuro/Psych Anxiety Depression Bipolar Disorder negative neurological ROS     GI/Hepatic Neg liver ROS, GERD  Medicated and Controlled,  Endo/Other  Morbid obesity  Renal/GU negative Renal ROS  negative genitourinary   Musculoskeletal  (+) Arthritis , Osteoarthritis,    Abdominal   Peds  Hematology negative hematology ROS (+)   Anesthesia Other Findings   Reproductive/Obstetrics negative OB ROS                           Anesthesia Physical Anesthesia Plan  ASA: III  Anesthesia Plan: General   Post-op Pain Management:    Induction: Intravenous  PONV Risk Score and Plan: 2 and Ondansetron, Dexamethasone and Midazolam  Airway Management Planned: Oral ETT  Additional Equipment:   Intra-op Plan:   Post-operative Plan: Extubation in OR  Informed Consent: I have reviewed the patients History and Physical, chart, labs and discussed the procedure including the risks, benefits and alternatives for the proposed anesthesia with the patient or authorized representative who has indicated his/her understanding and acceptance.     Dental advisory given  Plan Discussed with: CRNA  Anesthesia Plan Comments: (PAT note written 11/05/2018 by Myra Gianotti, PA-C. )       Anesthesia Quick Evaluation

## 2018-11-05 NOTE — Telephone Encounter (Signed)
Julian Lucas with [facility] anest[facility] is calling and would like for Dr. Lindell Noe to call her. She would like to discuss this pt's last appointment and his pft before he has his back surgery next week 11/10/18.   The best call back number is [phone].

## 2018-11-08 ENCOUNTER — Encounter (HOSPITAL_COMMUNITY): Payer: Self-pay | Admitting: Licensed Clinical Social Worker

## 2018-11-08 ENCOUNTER — Ambulatory Visit (INDEPENDENT_AMBULATORY_CARE_PROVIDER_SITE_OTHER): Payer: Medicaid Other | Admitting: Licensed Clinical Social Worker

## 2018-11-08 ENCOUNTER — Other Ambulatory Visit: Payer: Self-pay

## 2018-11-08 DIAGNOSIS — F1210 Cannabis abuse, uncomplicated: Secondary | ICD-10-CM

## 2018-11-08 DIAGNOSIS — F331 Major depressive disorder, recurrent, moderate: Secondary | ICD-10-CM | POA: Diagnosis not present

## 2018-11-08 NOTE — Progress Notes (Signed)
   THERAPIST PROGRESS NOTE  Session Time: 3:10-3:40pm  Participation Level: Active  Behavioral Response: CasualAlert/Anxious  Type of Therapy: Individual Therapy  Treatment Goals addressed: Improve Psychiatric Symptoms, elevate mood (increased self-esteem, increased self-compassion, increased interaction), improve unhelpful thought patterns, controlled behavior, moderate mood, deliberate speech and thought process(improved social functioning, healthy adjustment to living situation), Learn about diagnosis, healthy coping skills.  Interventions: CBT/TX plan review  Summary: Julian Lucas is a 43 y.o. male who presents for his individual counseling session. Pt discussed his psychiatric symptoms and current life events. Pt was anxious but excited. "Im having surgery on Wednesday!" Pt is very hopeful for the relief he will feel after surgery. Discussed his upcoming surgery. Pt had a lung function evaluation before he could be cleared for therapy. Discussed tobacco use vs cigar use vs marijuana use. Educated pt on all tobacco uses. Pt listened and asked questions but said, "Im still gonna smoke." Pt reports his daughter will be in the home to help with his recovery.His girlfriend got a new job as a Hydrographic surveyor. Pt will not make another appt until after his recovery from his surgery.      Suicidal/Homicidal: no/without plan  Therapist Response: Assessed pt's current functioning and reviewed progress. Assisted pt processing upcoming surgery, smoking effects on lung function, current life events. Assisted pt processing for the management of his stressors.   Plan: Return again in 2-3 weeks.  Diagnosis: Axis I: MDD, recurrent episode moderate        Mattis Featherly S, LCAS 11/08/2018

## 2018-11-09 MED ORDER — DEXTROSE 5 % IV SOLN
3.0000 g | INTRAVENOUS | Status: AC
  Administered 2018-11-10: 3 g via INTRAVENOUS
  Filled 2018-11-09: qty 3

## 2018-11-09 NOTE — Progress Notes (Signed)
Pt contacted. Patient denies domestic or international travel in the past month. Denies cough, cold, flu like symptom.  Pt reports support person is also absent of symptoms and had not traveled domestic or international in the past month.

## 2018-11-10 ENCOUNTER — Encounter (HOSPITAL_COMMUNITY): Payer: Self-pay | Admitting: Certified Registered Nurse Anesthetist

## 2018-11-10 ENCOUNTER — Inpatient Hospital Stay (HOSPITAL_COMMUNITY): Payer: Medicaid Other | Admitting: Certified Registered Nurse Anesthetist

## 2018-11-10 ENCOUNTER — Inpatient Hospital Stay (HOSPITAL_COMMUNITY)
Admission: RE | Admit: 2018-11-10 | Discharge: 2018-11-12 | DRG: 519 | Disposition: A | Discharge status: Home Health | Payer: Medicaid Other | Attending: Neurosurgery | Admitting: Neurosurgery

## 2018-11-10 ENCOUNTER — Inpatient Hospital Stay (HOSPITAL_COMMUNITY): Payer: Medicaid Other | Admitting: Vascular Surgery

## 2018-11-10 ENCOUNTER — Encounter (HOSPITAL_COMMUNITY)
Admission: RE | Disposition: A | Discharge status: Home Health | Payer: Self-pay | Source: Home / Self Care | Attending: Neurosurgery

## 2018-11-10 ENCOUNTER — Other Ambulatory Visit: Payer: Self-pay

## 2018-11-10 ENCOUNTER — Inpatient Hospital Stay (HOSPITAL_COMMUNITY): Payer: Medicaid Other

## 2018-11-10 DIAGNOSIS — F319 Bipolar disorder, unspecified: Secondary | ICD-10-CM | POA: Diagnosis present

## 2018-11-10 DIAGNOSIS — Z86711 Personal history of pulmonary embolism: Secondary | ICD-10-CM | POA: Diagnosis not present

## 2018-11-10 DIAGNOSIS — M5010 Cervical disc disorder with radiculopathy, unspecified cervical region: Secondary | ICD-10-CM

## 2018-11-10 DIAGNOSIS — M48061 Spinal stenosis, lumbar region without neurogenic claudication: Secondary | ICD-10-CM | POA: Diagnosis present

## 2018-11-10 DIAGNOSIS — F419 Anxiety disorder, unspecified: Secondary | ICD-10-CM | POA: Diagnosis present

## 2018-11-10 DIAGNOSIS — G959 Disease of spinal cord, unspecified: Secondary | ICD-10-CM | POA: Diagnosis present

## 2018-11-10 DIAGNOSIS — F17290 Nicotine dependence, other tobacco product, uncomplicated: Secondary | ICD-10-CM | POA: Diagnosis present

## 2018-11-10 DIAGNOSIS — M5000 Cervical disc disorder with myelopathy, unspecified cervical region: Secondary | ICD-10-CM | POA: Diagnosis present

## 2018-11-10 DIAGNOSIS — Z79899 Other long term (current) drug therapy: Secondary | ICD-10-CM | POA: Diagnosis not present

## 2018-11-10 DIAGNOSIS — M4804 Spinal stenosis, thoracic region: Secondary | ICD-10-CM | POA: Diagnosis present

## 2018-11-10 DIAGNOSIS — Z6841 Body Mass Index (BMI) 40.0 and over, adult: Secondary | ICD-10-CM | POA: Diagnosis not present

## 2018-11-10 DIAGNOSIS — Z818 Family history of other mental and behavioral disorders: Secondary | ICD-10-CM

## 2018-11-10 DIAGNOSIS — G8929 Other chronic pain: Secondary | ICD-10-CM | POA: Diagnosis present

## 2018-11-10 DIAGNOSIS — M4802 Spinal stenosis, cervical region: Secondary | ICD-10-CM | POA: Diagnosis present

## 2018-11-10 DIAGNOSIS — I10 Essential (primary) hypertension: Secondary | ICD-10-CM | POA: Diagnosis present

## 2018-11-10 DIAGNOSIS — Z419 Encounter for procedure for purposes other than remedying health state, unspecified: Secondary | ICD-10-CM

## 2018-11-10 DIAGNOSIS — M545 Low back pain: Secondary | ICD-10-CM | POA: Diagnosis present

## 2018-11-10 DIAGNOSIS — Z8249 Family history of ischemic heart disease and other diseases of the circulatory system: Secondary | ICD-10-CM | POA: Diagnosis not present

## 2018-11-10 DIAGNOSIS — M4807 Spinal stenosis, lumbosacral region: Secondary | ICD-10-CM | POA: Diagnosis present

## 2018-11-10 DIAGNOSIS — K219 Gastro-esophageal reflux disease without esophagitis: Secondary | ICD-10-CM | POA: Diagnosis present

## 2018-11-10 DIAGNOSIS — M1712 Unilateral primary osteoarthritis, left knee: Secondary | ICD-10-CM | POA: Diagnosis present

## 2018-11-10 DIAGNOSIS — R29818 Other symptoms and signs involving the nervous system: Secondary | ICD-10-CM

## 2018-11-10 HISTORY — PX: LUMBAR LAMINECTOMY/DECOMPRESSION MICRODISCECTOMY: SHX5026

## 2018-11-10 SURGERY — LUMBAR LAMINECTOMY/DECOMPRESSION MICRODISCECTOMY 4 LEVEL
Anesthesia: General | Site: Back | Laterality: Left

## 2018-11-10 MED ORDER — HYDROMORPHONE HCL 1 MG/ML IJ SOLN
0.5000 mg | INTRAMUSCULAR | Status: DC | PRN
  Administered 2018-11-10: 0.5 mg via INTRAVENOUS
  Filled 2018-11-10: qty 0.5

## 2018-11-10 MED ORDER — PROPOFOL 10 MG/ML IV BOLUS
INTRAVENOUS | Status: DC | PRN
  Administered 2018-11-10: 200 mg via INTRAVENOUS

## 2018-11-10 MED ORDER — FENTANYL CITRATE (PF) 100 MCG/2ML IJ SOLN
INTRAMUSCULAR | Status: DC | PRN
  Administered 2018-11-10 (×4): 50 ug via INTRAVENOUS
  Administered 2018-11-10: 100 ug via INTRAVENOUS
  Administered 2018-11-10 (×2): 25 ug via INTRAVENOUS
  Administered 2018-11-10: 50 ug via INTRAVENOUS

## 2018-11-10 MED ORDER — QUETIAPINE FUMARATE 25 MG PO TABS
25.0000 mg | ORAL_TABLET | Freq: Every day | ORAL | Status: DC
  Administered 2018-11-10 – 2018-11-11 (×2): 25 mg via ORAL
  Filled 2018-11-10 (×2): qty 1

## 2018-11-10 MED ORDER — TRAMADOL HCL 50 MG PO TABS
50.0000 mg | ORAL_TABLET | Freq: Four times a day (QID) | ORAL | Status: DC
  Administered 2018-11-10 – 2018-11-12 (×4): 50 mg via ORAL
  Filled 2018-11-10 (×4): qty 1

## 2018-11-10 MED ORDER — EPHEDRINE SULFATE-NACL 50-0.9 MG/10ML-% IV SOSY
PREFILLED_SYRINGE | INTRAVENOUS | Status: DC | PRN
  Administered 2018-11-10: 5 mg via INTRAVENOUS

## 2018-11-10 MED ORDER — LIDOCAINE-EPINEPHRINE 1 %-1:100000 IJ SOLN
INTRAMUSCULAR | Status: DC | PRN
  Administered 2018-11-10: 10 mL

## 2018-11-10 MED ORDER — GABAPENTIN 300 MG PO CAPS
300.0000 mg | ORAL_CAPSULE | Freq: Three times a day (TID) | ORAL | Status: DC
  Administered 2018-11-10 – 2018-11-12 (×6): 300 mg via ORAL
  Filled 2018-11-10 (×6): qty 1

## 2018-11-10 MED ORDER — SODIUM CHLORIDE 0.9% FLUSH: 3.0000 mL | Freq: Two times a day (BID) | INTRAVENOUS | Status: DC

## 2018-11-10 MED ORDER — PANTOPRAZOLE SODIUM 40 MG IV SOLR
40.0000 mg | Freq: Every day | INTRAVENOUS | Status: DC
  Administered 2018-11-10: 40 mg via INTRAVENOUS
  Filled 2018-11-10: qty 40

## 2018-11-10 MED ORDER — DEXAMETHASONE SODIUM PHOSPHATE 10 MG/ML IJ SOLN
INTRAMUSCULAR | Status: AC
  Filled 2018-11-10: qty 1

## 2018-11-10 MED ORDER — ALUM & MAG HYDROXIDE-SIMETH 200-200-20 MG/5ML PO SUSP
30.0000 mL | Freq: Four times a day (QID) | ORAL | Status: DC | PRN
  Administered 2018-11-11: 30 mL via ORAL
  Filled 2018-11-10: qty 30

## 2018-11-10 MED ORDER — FENTANYL CITRATE (PF) 250 MCG/5ML IJ SOLN
INTRAMUSCULAR | Status: AC
  Filled 2018-11-10: qty 5

## 2018-11-10 MED ORDER — SODIUM CHLORIDE 0.9 % IV SOLN
INTRAVENOUS | Status: DC | PRN
  Administered 2018-11-10: 13:00:00

## 2018-11-10 MED ORDER — THROMBIN 20000 UNITS EX SOLR
OROMUCOSAL | Status: DC | PRN
  Administered 2018-11-10: 13:00:00 via TOPICAL

## 2018-11-10 MED ORDER — LOSARTAN POTASSIUM 25 MG PO TABS
25.0000 mg | ORAL_TABLET | Freq: Every day | ORAL | Status: DC
  Administered 2018-11-10 – 2018-11-12 (×3): 25 mg via ORAL
  Filled 2018-11-10 (×3): qty 1

## 2018-11-10 MED ORDER — CYCLOBENZAPRINE HCL 10 MG PO TABS: 10.0000 mg | ORAL_TABLET | Freq: Three times a day (TID) | ORAL | Status: DC | PRN

## 2018-11-10 MED ORDER — 0.9 % SODIUM CHLORIDE (POUR BTL) OPTIME
TOPICAL | Status: DC | PRN
  Administered 2018-11-10: 1000 mL

## 2018-11-10 MED ORDER — PHENYLEPHRINE 40 MCG/ML (10ML) SYRINGE FOR IV PUSH (FOR BLOOD PRESSURE SUPPORT)
PREFILLED_SYRINGE | INTRAVENOUS | Status: DC | PRN
  Administered 2018-11-10 (×2): 80 ug via INTRAVENOUS
  Administered 2018-11-10: 40 ug via INTRAVENOUS
  Administered 2018-11-10: 80 ug via INTRAVENOUS

## 2018-11-10 MED ORDER — LIDOCAINE 2% (20 MG/ML) 5 ML SYRINGE
INTRAMUSCULAR | Status: DC | PRN
  Administered 2018-11-10: 60 mg via INTRAVENOUS

## 2018-11-10 MED ORDER — CHLORHEXIDINE GLUCONATE CLOTH 2 % EX PADS: 6.0000 | MEDICATED_PAD | Freq: Once | CUTANEOUS | Status: DC

## 2018-11-10 MED ORDER — SUCCINYLCHOLINE CHLORIDE 20 MG/ML IJ SOLN
INTRAMUSCULAR | Status: DC | PRN
  Administered 2018-11-10: 120 mg via INTRAVENOUS

## 2018-11-10 MED ORDER — MENTHOL 3 MG MT LOZG: 1.0000 | LOZENGE | OROMUCOSAL | Status: DC | PRN

## 2018-11-10 MED ORDER — ROCURONIUM BROMIDE 50 MG/5ML IV SOSY
PREFILLED_SYRINGE | INTRAVENOUS | Status: AC
  Filled 2018-11-10: qty 15

## 2018-11-10 MED ORDER — CYCLOBENZAPRINE HCL 10 MG PO TABS
ORAL_TABLET | ORAL | Status: AC
  Administered 2018-11-10: 10 mg via ORAL
  Filled 2018-11-10: qty 1

## 2018-11-10 MED ORDER — CEFAZOLIN SODIUM-DEXTROSE 2-4 GM/100ML-% IV SOLN
2.0000 g | Freq: Three times a day (TID) | INTRAVENOUS | Status: AC
  Administered 2018-11-10 – 2018-11-11 (×2): 2 g via INTRAVENOUS
  Filled 2018-11-10 (×2): qty 100

## 2018-11-10 MED ORDER — DULOXETINE HCL 60 MG PO CPEP
60.0000 mg | ORAL_CAPSULE | ORAL | Status: DC
  Administered 2018-11-11 – 2018-11-12 (×2): 60 mg via ORAL
  Filled 2018-11-10: qty 1
  Filled 2018-11-10: qty 2
  Filled 2018-11-10: qty 1

## 2018-11-10 MED ORDER — PHENOL 1.4 % MT LIQD: 1.0000 | OROMUCOSAL | Status: DC | PRN

## 2018-11-10 MED ORDER — LIDOCAINE 2% (20 MG/ML) 5 ML SYRINGE
INTRAMUSCULAR | Status: AC
  Filled 2018-11-10: qty 5

## 2018-11-10 MED ORDER — BUPIVACAINE HCL (PF) 0.25 % IJ SOLN
INTRAMUSCULAR | Status: AC
  Filled 2018-11-10: qty 30

## 2018-11-10 MED ORDER — OXYCODONE HCL 5 MG PO TABS
10.0000 mg | ORAL_TABLET | ORAL | Status: DC | PRN
  Administered 2018-11-10 – 2018-11-12 (×11): 10 mg via ORAL
  Filled 2018-11-10 (×10): qty 2

## 2018-11-10 MED ORDER — FAMOTIDINE 10 MG PO TABS
10.0000 mg | ORAL_TABLET | Freq: Two times a day (BID) | ORAL | Status: DC
  Administered 2018-11-10 – 2018-11-12 (×4): 10 mg via ORAL
  Filled 2018-11-10 (×4): qty 1

## 2018-11-10 MED ORDER — ACETAMINOPHEN 500 MG PO TABS
1000.0000 mg | ORAL_TABLET | Freq: Once | ORAL | Status: AC
  Administered 2018-11-10: 1000 mg via ORAL
  Filled 2018-11-10: qty 2

## 2018-11-10 MED ORDER — SODIUM CHLORIDE 0.9% FLUSH: 3.0000 mL | INTRAVENOUS | Status: DC | PRN

## 2018-11-10 MED ORDER — PROPOFOL 10 MG/ML IV BOLUS
INTRAVENOUS | Status: AC
  Filled 2018-11-10: qty 20

## 2018-11-10 MED ORDER — ALBUMIN HUMAN 5 % IV SOLN
INTRAVENOUS | Status: DC | PRN
  Administered 2018-11-10: 15:00:00 via INTRAVENOUS

## 2018-11-10 MED ORDER — ONDANSETRON HCL 4 MG/2ML IJ SOLN: 4.0000 mg | Freq: Four times a day (QID) | INTRAMUSCULAR | Status: DC | PRN

## 2018-11-10 MED ORDER — HYDROMORPHONE HCL 1 MG/ML IJ SOLN: 0.2500 mg | INTRAMUSCULAR | Status: DC | PRN

## 2018-11-10 MED ORDER — LIDOCAINE-EPINEPHRINE 1 %-1:100000 IJ SOLN
INTRAMUSCULAR | Status: AC
  Filled 2018-11-10: qty 1

## 2018-11-10 MED ORDER — THROMBIN 20000 UNITS EX SOLR
CUTANEOUS | Status: AC
  Filled 2018-11-10: qty 20000

## 2018-11-10 MED ORDER — ONDANSETRON HCL 4 MG/2ML IJ SOLN
INTRAMUSCULAR | Status: DC | PRN
  Administered 2018-11-10: 4 mg via INTRAVENOUS

## 2018-11-10 MED ORDER — ONDANSETRON HCL 4 MG/2ML IJ SOLN
INTRAMUSCULAR | Status: AC
  Filled 2018-11-10: qty 2

## 2018-11-10 MED ORDER — MIDAZOLAM HCL 2 MG/2ML IJ SOLN
INTRAMUSCULAR | Status: AC
  Filled 2018-11-10: qty 2

## 2018-11-10 MED ORDER — ONDANSETRON HCL 4 MG PO TABS: 4.0000 mg | ORAL_TABLET | Freq: Four times a day (QID) | ORAL | Status: DC | PRN

## 2018-11-10 MED ORDER — LACTATED RINGERS IV SOLN
INTRAVENOUS | Status: DC
  Administered 2018-11-10 (×3): via INTRAVENOUS

## 2018-11-10 MED ORDER — SODIUM CHLORIDE 0.9 % IV SOLN: 250.0000 mL | INTRAVENOUS | Status: DC

## 2018-11-10 MED ORDER — SUGAMMADEX SODIUM 500 MG/5ML IV SOLN
INTRAVENOUS | Status: AC
  Filled 2018-11-10: qty 5

## 2018-11-10 MED ORDER — OXYCODONE HCL 5 MG PO TABS
ORAL_TABLET | ORAL | Status: AC
  Administered 2018-11-10: 10 mg via ORAL
  Filled 2018-11-10: qty 2

## 2018-11-10 MED ORDER — ROCURONIUM BROMIDE 50 MG/5ML IV SOSY
PREFILLED_SYRINGE | INTRAVENOUS | Status: AC
  Filled 2018-11-10: qty 5

## 2018-11-10 MED ORDER — ROCURONIUM BROMIDE 10 MG/ML (PF) SYRINGE
PREFILLED_SYRINGE | INTRAVENOUS | Status: DC | PRN
  Administered 2018-11-10: 10 mg via INTRAVENOUS
  Administered 2018-11-10: 20 mg via INTRAVENOUS
  Administered 2018-11-10: 50 mg via INTRAVENOUS
  Administered 2018-11-10: 30 mg via INTRAVENOUS

## 2018-11-10 MED ORDER — MIDAZOLAM HCL 5 MG/5ML IJ SOLN
INTRAMUSCULAR | Status: DC | PRN
  Administered 2018-11-10: 2 mg via INTRAVENOUS

## 2018-11-10 MED ORDER — CYCLOBENZAPRINE HCL 10 MG PO TABS
10.0000 mg | ORAL_TABLET | Freq: Three times a day (TID) | ORAL | Status: DC | PRN
  Administered 2018-11-10 – 2018-11-12 (×5): 10 mg via ORAL
  Filled 2018-11-10 (×5): qty 1

## 2018-11-10 MED ORDER — SUGAMMADEX SODIUM 500 MG/5ML IV SOLN
INTRAVENOUS | Status: DC | PRN
  Administered 2018-11-10: 300 mg via INTRAVENOUS

## 2018-11-10 MED ORDER — BUPIVACAINE HCL (PF) 0.25 % IJ SOLN
INTRAMUSCULAR | Status: DC | PRN
  Administered 2018-11-10: 10 mL

## 2018-11-10 MED ORDER — ACETAMINOPHEN 650 MG RE SUPP: 650.0000 mg | RECTAL | Status: DC | PRN

## 2018-11-10 MED ORDER — SUCCINYLCHOLINE CHLORIDE 200 MG/10ML IV SOSY
PREFILLED_SYRINGE | INTRAVENOUS | Status: AC
  Filled 2018-11-10: qty 10

## 2018-11-10 MED ORDER — ACETAMINOPHEN 325 MG PO TABS
650.0000 mg | ORAL_TABLET | ORAL | Status: DC | PRN
  Administered 2018-11-10 – 2018-11-12 (×4): 650 mg via ORAL
  Filled 2018-11-10 (×4): qty 2

## 2018-11-10 MED ORDER — DEXAMETHASONE SODIUM PHOSPHATE 10 MG/ML IJ SOLN
10.0000 mg | INTRAMUSCULAR | Status: AC
  Administered 2018-11-10: 10 mg via INTRAVENOUS
  Filled 2018-11-10: qty 1

## 2018-11-10 SURGICAL SUPPLY — 62 items
BAG DECANTER FOR FLEXI CONT (MISCELLANEOUS) ×3 IMPLANT
BENZOIN TINCTURE PRP APPL 2/3 (GAUZE/BANDAGES/DRESSINGS) ×3 IMPLANT
BLADE CLIPPER SURG (BLADE) ×6 IMPLANT
BLADE SURG 11 STRL SS (BLADE) ×3 IMPLANT
BUR CUTTER 7.0 ROUND (BURR) ×3 IMPLANT
BUR MATCHSTICK NEURO 3.0 LAGG (BURR) ×3 IMPLANT
CANISTER SUCT 3000ML PPV (MISCELLANEOUS) ×3 IMPLANT
CARTRIDGE OIL MAESTRO DRILL (MISCELLANEOUS) ×1 IMPLANT
CLOSURE WOUND 1/2 X4 (GAUZE/BANDAGES/DRESSINGS) ×2
COVER MAYO STAND STRL (DRAPES) ×6 IMPLANT
COVER WAND RF STERILE (DRAPES) IMPLANT
DECANTER SPIKE VIAL GLASS SM (MISCELLANEOUS) ×3 IMPLANT
DERMABOND ADVANCED (GAUZE/BANDAGES/DRESSINGS) ×2
DERMABOND ADVANCED .7 DNX12 (GAUZE/BANDAGES/DRESSINGS) ×1 IMPLANT
DIFFUSER DRILL AIR PNEUMATIC (MISCELLANEOUS) ×3 IMPLANT
DRAPE HALF SHEET 40X57 (DRAPES) ×3 IMPLANT
DRAPE LAPAROTOMY 100X72X124 (DRAPES) ×3 IMPLANT
DRAPE MICROSCOPE LEICA (MISCELLANEOUS) ×3 IMPLANT
DRAPE SURG 17X23 STRL (DRAPES) ×3 IMPLANT
DRSG OPSITE 4X5.5 SM (GAUZE/BANDAGES/DRESSINGS) ×3 IMPLANT
DRSG OPSITE POSTOP 3X4 (GAUZE/BANDAGES/DRESSINGS) ×3 IMPLANT
DRSG OPSITE POSTOP 4X6 (GAUZE/BANDAGES/DRESSINGS) ×3 IMPLANT
DURAPREP 26ML APPLICATOR (WOUND CARE) ×3 IMPLANT
ELECT BLADE 4.0 EZ CLEAN MEGAD (MISCELLANEOUS) ×3
ELECT REM PT RETURN 9FT ADLT (ELECTROSURGICAL) ×3
ELECTRODE BLDE 4.0 EZ CLN MEGD (MISCELLANEOUS) ×1 IMPLANT
ELECTRODE REM PT RTRN 9FT ADLT (ELECTROSURGICAL) ×1 IMPLANT
EVACUATOR 1/8 PVC DRAIN (DRAIN) ×3 IMPLANT
GAUZE 4X4 16PLY RFD (DISPOSABLE) ×6 IMPLANT
GAUZE SPONGE 4X4 12PLY STRL (GAUZE/BANDAGES/DRESSINGS) ×3 IMPLANT
GAUZE SPONGE 4X4 12PLY STRL LF (GAUZE/BANDAGES/DRESSINGS) ×3 IMPLANT
GLOVE BIO SURGEON STRL SZ7 (GLOVE) IMPLANT
GLOVE BIO SURGEON STRL SZ8 (GLOVE) ×3 IMPLANT
GLOVE BIOGEL PI IND STRL 6.5 (GLOVE) ×4 IMPLANT
GLOVE BIOGEL PI IND STRL 7.0 (GLOVE) IMPLANT
GLOVE BIOGEL PI INDICATOR 6.5 (GLOVE) ×8
GLOVE BIOGEL PI INDICATOR 7.0 (GLOVE)
GLOVE ECLIPSE 7.5 STRL STRAW (GLOVE) IMPLANT
GLOVE EXAM NITRILE XL STR (GLOVE) IMPLANT
GLOVE INDICATOR 8.5 STRL (GLOVE) ×3 IMPLANT
GOWN STRL REUS W/ TWL LRG LVL3 (GOWN DISPOSABLE) ×1 IMPLANT
GOWN STRL REUS W/ TWL XL LVL3 (GOWN DISPOSABLE) ×2 IMPLANT
GOWN STRL REUS W/TWL 2XL LVL3 (GOWN DISPOSABLE) IMPLANT
GOWN STRL REUS W/TWL LRG LVL3 (GOWN DISPOSABLE) ×2
GOWN STRL REUS W/TWL XL LVL3 (GOWN DISPOSABLE) ×4
KIT BASIN OR (CUSTOM PROCEDURE TRAY) ×3 IMPLANT
KIT TURNOVER KIT B (KITS) ×3 IMPLANT
NEEDLE HYPO 22GX1.5 SAFETY (NEEDLE) ×3 IMPLANT
NEEDLE SPNL 22GX3.5 QUINCKE BK (NEEDLE) ×3 IMPLANT
NS IRRIG 1000ML POUR BTL (IV SOLUTION) ×3 IMPLANT
OIL CARTRIDGE MAESTRO DRILL (MISCELLANEOUS) ×3
PACK LAMINECTOMY NEURO (CUSTOM PROCEDURE TRAY) ×3 IMPLANT
RUBBERBAND STERILE (MISCELLANEOUS) ×6 IMPLANT
SPONGE SURGIFOAM ABS GEL SZ50 (HEMOSTASIS) ×3 IMPLANT
STRIP CLOSURE SKIN 1/2X4 (GAUZE/BANDAGES/DRESSINGS) ×4 IMPLANT
SUT VIC AB 0 CT1 18XCR BRD8 (SUTURE) ×2 IMPLANT
SUT VIC AB 0 CT1 8-18 (SUTURE) ×4
SUT VIC AB 2-0 CT1 18 (SUTURE) ×6 IMPLANT
SUT VIC AB 4-0 PS2 27 (SUTURE) ×6 IMPLANT
TOWEL GREEN STERILE (TOWEL DISPOSABLE) ×3 IMPLANT
TOWEL GREEN STERILE FF (TOWEL DISPOSABLE) ×3 IMPLANT
WATER STERILE IRR 1000ML POUR (IV SOLUTION) ×3 IMPLANT

## 2018-11-10 NOTE — Op Note (Signed)
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  EBL: Minimal  History of present illness:43 year old gentleman with long-standing issues with his neck and his back 7 chronic cervical myelopathy have progress worsening pain in his back and legs and difficulty walking and workup revealed severe cord compression at T10-11 as well as severe lumbar spinal stenosis at L3-4, L4-5, L5-S1. Due to patient's progression of clinical syndrome imaging findings and failure conservative treatment I recommended transpedicular discectomy thoracic laminectomy at T10-11 as well as decompressive lumbar laminectomies at L3-4, L4-5, L5-S1. I extensively went over the risks and benefits of the operation with him as well as perioperative course expectations of outcome and alternatives surgery and he understood and agreed to proceed forward.  Operative procedure: Patient brought into the or was induced on general anesthesia positioned prone on the Wilson frame his back was prepped and draped in routine sterile fashion preoperative x-ray localize the T10-11 disc space so after infiltration of 10 mL lidocaine with epi a midline incision was made and Bovie light cautery was used to calcification subperiosteal dissections care lamina of T10-11 on the left side. Interoperative x-ray confirmed identification appropriate level so utilizing a high-speed drill the inferior aspect of T10 medial facet complex super aspect of T11 was drilled down laminotomy was begun  with a 2 mm Kerrison punch. Leg was identified and removed in piecemeal fashion there was some dorsal spur that was removed identify the T11 pedicle drilled and the pedicle identify the disc space there was a very large free fragment disc herniation causing severe cord compression extending to the left this was incised and removed with a nerve hook and micropituitary's. I then drilled from the T11 pedicle and the disc space further to gain access to the lateral aspect the disc space and cleaned out large amount of additional disc material herniating towards the spinal cord from central compartment. At the end the discectomy was no further stenosis on thecal sac the wound scope was irrigated meticulous he states was maintained and this wound was closed in layers with after Vicryl running 4 subcuticular Dermabond benzo and Steri-Strips. Attention was then taken to the lumbar incision after infiltration of 10 mL lidocaine lidocaine with epi a midline incision made and Bovie light cautery was used to take down subcutaneous tissues and subperiosteal dissections care lamina of L3, L4, L5 and S1. Interoperative x-ray confirmed with desiccation appropriate level so the spinous processes at L3-L4 and L5 were removed central decompression was begun there was marked hourglass compression of the thecal sac at L3-4, L4-5 and L5-S1 this was all teased off of the dura dissected free and with under biting the medial gutter marching down both sides I decompressed and unroofed all of foramina at L3, L4, L5 and S1 bilaterally. At the end of decompression was no further stenosis either centrally or foraminally the wounds and copiously irrigated meticulous hemostasis was maintained Gelfoam was overlaid top of the dura the muscle fascia pressure layers with after Vicryl the skin was closed running 4 subcuticular Dermabond benzo and Steri-Strips and a sterile dressing was applied and patient went covered in stable  condition. At the end  the case all needle counts sponge counts were correct.

## 2018-11-10 NOTE — H&P (Signed)
Julian Lucas is an 43 y.o. male.   Chief Complaint: back pain and bilateral leg pain numbness tingling weakness urinary retention HPI: 43 year old gentleman with long-standing issues with myelopathy from cervical stenosis is a multiple procedures on his neck however he's had progressive worsening mid and lower back pain with progressive difficulty walking ambulation some urinary retention numbness and tingling in his legs and feet. Workup revealed severe cord compression spinal stenosis from disc herniation at as well as severe spinal stenosis thecal sac compression from lumbar spinal stenosis L3-S1. Due to patient's significant thecal sac compression spinal cord compression both levels I recommended transpedicular discectomy at T10-11 as well as decompressive lumbar lateral to be L3-S1. I've extensively gone over the risks and benefits of that operation with him as well as perioperative course expectations of outcome and alternatives to surgery and he understands and agrees to proceed forward.  Past Medical History:  Diagnosis Date  . Anxiety   . Bipolar disorder ([place])   . 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 Chronic pain of multiple joints   . DEGENERATIVE JOINT DISEASE, LEFT KNEE 09/09/2010   Qualifier: Diagnosis of  By: Oneida Alar MD, KARL    . Depression   . Family history of adverse reaction to anesthesia    mom takes a long time to wake up  . GERD (gastroesophageal reflux disease)   . HERPES GENITALIS 11/21/2009   Qualifier: Diagnosis of  By: Jeannine Kitten MD, Rodman Key    . Hypertension   . PULMONARY EMBOLISM, HX OF 11/21/2009   Qualifier: Diagnosis of  By: Tye Savoy MD, Tommi Rumps    . UNEQUAL LEG LENGTH 08/09/2010   Qualifier: Diagnosis of  By: Ernestina Patches MD, Remo Lipps      Past Surgical History:  Procedure Laterality Date  . ANTERIOR CERVICAL DECOMP/DISCECTOMY FUSION N/A 11/23/2017   Procedure: ANTERIOR CERVICAL DECOMPRESSION AND FUSION CERVICAL SIX-SEVEN;  Surgeon: Kary Kos, MD;   Location: [facility];  Service: Neurosurgery;  Laterality: N/A;  anterior  . KNEE SURGERY    . NECK SURGERY    . TONSILECTOMY, ADENOIDECTOMY, BILATERAL MYRINGOTOMY AND TUBES      Family History  Problem Relation Age of Onset  . Lung cancer Father   . Alcohol abuse Father   . Depression Mother   . Hypertension Mother   . Heart disease Mother    Social History:  reports that he has been smoking cigars. He has never used smokeless tobacco. He reports previous alcohol use. He reports current drug use. Drug: Marijuana.  Allergies: No Known Allergies  Medications Prior to Admission  Medication Sig Dispense Refill  . cyclobenzaprine (FLEXERIL) 10 MG tablet Take 10 mg by mouth 3 (three) times daily as needed for muscle spasms.    . DULoxetine (CYMBALTA) 60 MG capsule Take 1 capsule (60 mg total) by mouth every morning. 90 capsule 0  . gabapentin (NEURONTIN) 300 MG capsule TAKE 1 CAPSULE BY MOUTH THREE TIMES A DAY (Patient taking differently: Take 300 mg by mouth 3 (three) times daily. ) 270 capsule 1  . losartan (COZAAR) 25 MG tablet Take 1 tablet (25 mg total) by mouth daily. 90 tablet 1  . QUEtiapine (SEROQUEL) 25 MG tablet Take 1 tablet (25 mg total) by mouth at bedtime. 90 tablet 0  . ranitidine (ZANTAC) 150 MG tablet Take 1 tablet (150 mg total) by mouth 2 (two) times daily as needed for heartburn. 60 tablet 1  . traMADol (ULTRAM) 50 MG tablet Take 1 tablet (50  mg total) by mouth every 12 (twelve) hours as needed. 60 tablet 0  . AMBULATORY NON FORMULARY MEDICATION Patient needs a heavy duty walking cane 1 Units 0  . Vitamin D, Ergocalciferol, (DRISDOL) 50000 units CAPS capsule TAKE 1 CAPSULE (50,000 UNITS TOTAL) BY MOUTH EVERY 7 (SEVEN) DAYS. 8 capsule 0    No results found for this or any previous visit (from the past 48 hour(s)). No results found.  Review of Systems  Musculoskeletal: Positive for back pain and neck pain.  Neurological: Positive for tingling, sensory change and focal  weakness.    Blood pressure (!) 135/91, pulse 69, temperature 98.2 F (36.8 C), temperature source Oral, resp. rate 20, height 6\' 2"  (1.88 m), weight (!) 147.4 kg, SpO2 99 %. Physical Exam  Constitutional: He is oriented to person, place, and time. He appears well-developed.  HENT:  Head: Normocephalic.  Eyes: Pupils are equal, round, and reactive to light.  Neck: Normal range of motion.  Respiratory: Effort normal.  GI: Soft. Bowel sounds are normal.  Neurological: He is alert and oriented to person, place, and time. He has normal strength. GCS eye subscore is 4. GCS verbal subscore is 5. GCS motor subscore is 6.  Strength is 5 of 5 iliopsoas, quads, hamstrings, gastrocs, and tibialis, EHL.  Skin: Skin is warm and dry.     Assessment/Plan 43 year old gentleman presents for thoracic laminectomy transpedicular discectomy T10-11 and decompressive thoracic laminectomy L3-S1  Irais Mottram P, MD 11/10/2018, 11:26 AM

## 2018-11-10 NOTE — Anesthesia Procedure Notes (Signed)
Procedure Name: Intubation Date/Time: 11/10/2018 12:08 PM Performed by: Verdie Drown, CRNA Pre-anesthesia Checklist: Patient identified, Emergency Drugs available, Suction available and Patient being monitored Patient Re-evaluated:Patient Re-evaluated prior to induction Oxygen Delivery Method: Circle System Utilized Preoxygenation: Pre-oxygenation with 100% oxygen Induction Type: IV induction Ventilation: Mask ventilation without difficulty Laryngoscope Size: Mac and 4 Grade View: Grade II Tube type: Oral Tube size: 7.5 mm Number of attempts: 1 Airway Equipment and Method: Stylet and Oral airway Placement Confirmation: ETT inserted through vocal cords under direct vision,  positive ETCO2 and breath sounds checked- equal and bilateral Secured at: 24 cm Tube secured with: Tape Dental Injury: Teeth and Oropharynx as per pre-operative assessment

## 2018-11-10 NOTE — Transfer of Care (Signed)
Immediate Anesthesia Transfer of Care Note  Patient: Julian Lucas  Procedure(s) Performed: Laminectomy and Foraminotomy - Lumbar Three-Lumbar Four - Lumbar Four-Lumbar Five - Lumbar Five-Sacral One, laminectomy and microdiskectomy Thoracic Ten-Thoracic Eleven - left (Left Back)  Patient Location: PACU  Anesthesia Type:General  Level of Consciousness: awake, patient cooperative and responds to stimulation  Airway & Oxygen Therapy: Patient Spontanous Breathing and Patient connected to face mask oxygen  Post-op Assessment: Report given to RN and Post -op Vital signs reviewed and stable  Post vital signs: Reviewed and stable  Last Vitals:  Vitals Value Taken Time  BP 113/81 11/10/2018  3:42 PM  Temp    Pulse 55 11/10/2018  3:44 PM  Resp 18 11/10/2018  3:44 PM  SpO2 96 % 11/10/2018  3:44 PM  Vitals shown include unvalidated device data.  Last Pain:  Vitals:   11/10/18 1540  TempSrc:   PainSc: (P) Asleep      Patients Stated Pain Goal: 2 (18/55/01 5868)  Complications: No apparent anesthesia complications

## 2018-11-10 NOTE — Anesthesia Postprocedure Evaluation (Signed)
Anesthesia Post Note  Patient: Julian Lucas  Procedure(s) Performed: Laminectomy and Foraminotomy - Lumbar Three-Lumbar Four - Lumbar Four-Lumbar Five - Lumbar Five-Sacral One, laminectomy and microdiskectomy Thoracic Ten-Thoracic Eleven - left (Left Back)     Patient location during evaluation: PACU Anesthesia Type: General Level of consciousness: awake and alert Pain management: pain level controlled Vital Signs Assessment: post-procedure vital signs reviewed and stable Respiratory status: spontaneous breathing, nonlabored ventilation, respiratory function stable and patient connected to nasal cannula oxygen Cardiovascular status: blood pressure returned to baseline and stable Postop Assessment: no apparent nausea or vomiting Anesthetic complications: no    Last Vitals:  Vitals:   11/10/18 1733 11/10/18 1917  BP: (!) 149/96 (!) 153/95  Pulse: (!) 56 (!) 55  Resp: (!) 21 20  Temp:  36.6 C  SpO2:  98%    Last Pain:  Vitals:   11/10/18 1919  TempSrc:   PainSc: 5                  Ardeth Repetto COKER

## 2018-11-11 ENCOUNTER — Encounter (HOSPITAL_COMMUNITY): Payer: Self-pay | Admitting: Neurosurgery

## 2018-11-11 MED ORDER — PANTOPRAZOLE SODIUM 40 MG PO TBEC
40.0000 mg | DELAYED_RELEASE_TABLET | Freq: Every day | ORAL | Status: DC
  Administered 2018-11-11: 40 mg via ORAL
  Filled 2018-11-11: qty 1

## 2018-11-11 MED ORDER — DEXAMETHASONE SODIUM PHOSPHATE 10 MG/ML IJ SOLN: 10.0000 mg | Freq: Four times a day (QID) | INTRAMUSCULAR | Status: DC

## 2018-11-11 MED ORDER — DEXAMETHASONE 4 MG PO TABS
10.0000 mg | ORAL_TABLET | Freq: Four times a day (QID) | ORAL | Status: DC
  Administered 2018-11-11 – 2018-11-12 (×5): 10 mg via ORAL
  Filled 2018-11-11 (×5): qty 3

## 2018-11-11 NOTE — Evaluation (Signed)
Physical Therapy Evaluation Patient Details Name: Julian Lucas MRN: [id number] DOB: [birth year] Today's Date: 11/11/2018   History of Present Illness  Pt is a 43 yo male s/p thoracic laminectomy partial medial facectomy and foraminal L3, L4, L5 and S1 B/L with decompressive lumbar laminectomy. Pmhx: back surgeries.  Clinical Impression  Pt admitted with above diagnosis. Pt currently with functional limitations due to the deficits listed below (see PT Problem List). At the time of PT eval pt was limited by pain. Overall pt demonstrated a great rehab effort however was obviously in significant pain. We were not able to attempt stair training this date, but will need to practice prior to d/c home as he has 2 flights to enter his apartment. Pt was educated on precautions, car transfer, activity progression and positioning recommendations. Pt will benefit from skilled PT to increase their independence and safety with mobility to allow discharge to the venue listed below.       Follow Up Recommendations No PT follow up;Supervision - Intermittent    Equipment Recommendations  None recommended by PT    Recommendations for Other Services       Precautions / Restrictions Precautions Precautions: Back Precaution Booklet Issued: Yes (comment) Precaution Comments: Reviewed handout. Pt was cued for precautions during functional mobility.  Restrictions Weight Bearing Restrictions: No      Mobility  Bed Mobility Overal bed mobility: Needs Assistance Bed Mobility: Rolling;Sidelying to Sit Rolling: Supervision Sidelying to sit: Supervision       General bed mobility comments: Heavy use of rails for support  Transfers Overall transfer level: Needs assistance Equipment used: Rolling walker (2 wheeled) Transfers: Sit to/from Stand Sit to Stand: Min guard         General transfer comment: Close guard for safety as pt with increased pain on power-up to full stand. Bed elevated for comfort.    Ambulation/Gait Ambulation/Gait assistance: Min guard Gait Distance (Feet): 75 Feet Assistive device: Rolling walker (2 wheeled) Gait Pattern/deviations: Step-through pattern;Decreased stride length;Trunk flexed Gait velocity: Decreased Gait velocity interpretation: <1.8 ft/sec, indicate of risk for recurrent falls General Gait Details: Very slow and guarded due to pain. Pt requiring frequent standing rest breaks as well due to pain.   Stairs         General stair comments: Unable to tolerate due to pain.   Wheelchair Mobility    Modified Rankin (Stroke Patients Only)       Balance Overall balance assessment: Needs assistance   Sitting balance-Leahy Scale: Fair       Standing balance-Leahy Scale: Fair Standing balance comment: requiring RW for UE support                             Pertinent Vitals/Pain Pain Assessment: Faces Pain Score: 7  Faces Pain Scale: Hurts whole lot Pain Location: low back Pain Descriptors / Indicators: Burning;Throbbing Pain Intervention(s): Monitored during session;Limited activity within patient's tolerance;Repositioned    Home Living Family/patient expects to be discharged to:: Private residence Living Arrangements: Spouse/significant other;Children Available Help at Discharge: Family;Available PRN/intermittently Type of Home: Apartment Home Access: Stairs to enter Entrance Stairs-Rails: (R first flight, L second flight) Entrance Stairs-Number of Steps: 2 flights Home Layout: One level Home Equipment: Walker - 2 wheels;Cane - single point Additional Comments: Pt reports wife is working as a Environmental education officer be available 24 hours    Prior Function Level of Independence: Independent with assistive device(s)  Comments: using SPC for mobility     Hand Dominance   Dominant Hand: Right    Extremity/Trunk Assessment   Upper Extremity Assessment Upper Extremity Assessment: Generalized weakness     Lower Extremity Assessment Lower Extremity Assessment: Generalized weakness(Consistent with pre-op diagnosis)    Cervical / Trunk Assessment Cervical / Trunk Assessment: Other exceptions Cervical / Trunk Exceptions: s/p surgery  Communication   Communication: No difficulties  Cognition Arousal/Alertness: Awake/alert Behavior During Therapy: WFL for tasks assessed/performed Overall Cognitive Status: Within Functional Limits for tasks assessed                                        General Comments General comments (skin integrity, edema, etc.): Pt reports increased pain in low back s/p this current back surgery compared to previous surgeries.    Exercises     Assessment/Plan    PT Assessment Patient needs continued PT services  PT Problem List Decreased strength;Decreased activity tolerance;Decreased balance;Decreased mobility;Decreased knowledge of use of DME;Decreased safety awareness;Decreased knowledge of precautions;Pain       PT Treatment Interventions DME instruction;Gait training;Stair training;Functional mobility training;Therapeutic activities;Therapeutic exercise;Neuromuscular re-education;Patient/family education    PT Goals (Current goals can be found in the Care Plan section)  Acute Rehab PT Goals Patient Stated Goal: to go home safely; decrease pain PT Goal Formulation: With patient Time For Goal Achievement: 11/18/18 Potential to Achieve Goals: Good    Frequency Min 5X/week   Barriers to discharge        Co-evaluation               AM-PAC PT "6 Clicks" Mobility  Outcome Measure Help needed turning from your back to your side while in a flat bed without using bedrails?: None Help needed moving from lying on your back to sitting on the side of a flat bed without using bedrails?: None Help needed moving to and from a bed to a chair (including a wheelchair)?: A Little Help needed standing up from a chair using your arms (e.g.,  wheelchair or bedside chair)?: A Little Help needed to walk in hospital room?: A Little Help needed climbing 3-5 steps with a railing? : A Little 6 Click Score: 20    End of Session Equipment Utilized During Treatment: Gait belt Activity Tolerance: Patient limited by pain Patient left: with call bell/phone within reach(Sitting EOB awaiting OT) Nurse Communication: Mobility status PT Visit Diagnosis: Unsteadiness on feet (R26.81);Pain;Other symptoms and signs involving the nervous system (R29.898) Pain - part of body: (back, quads)    Time: 4259-5638 PT Time Calculation (min) (ACUTE ONLY): 16 min   Charges:   PT Evaluation $PT Eval Moderate Complexity: 1 Mod          Julian Lucas, PT, DPT Acute Rehabilitation Services Pager: [phone] Office: [phone]   Julian Lucas 11/11/2018, 10:29 AM

## 2018-11-11 NOTE — Evaluation (Addendum)
Occupational Therapy Evaluation Patient Details Name: Julian Lucas MRN: [id number] DOB: 1975/11/04 Today's Date: 11/11/2018    History of Present Illness Pt is a 43 yo male s/p transpedicular thoracic discectomy T10-T11 with L3-S1 decompressive lumbar laminectomy. Pmhx: back surgeries.   Clinical Impression   Pt PTA: living with family limited by pain and inability to mobilize well. Pt lives with family in apartment with low commodes, using SPC. Pt currently, limited by pain, but relieved with mobilization with RW and fair balance for light grooming at sink. Back handout provided and reviewed adls in detail. Pt educated on: clothing between brace, never sleep in brace, set an alarm at night for medication, avoid sitting for long periods of time, correct bed positioning for sleeping, correct sequence for bed mobility, avoiding lifting more than 5 pounds and never wash directly over incision. All education is complete and patient indicates understanding. Pt tolerating session well performing LB dressing with no AE at this time. Pt requires 3in1 for low commodes at home and shower chair for tub shower at home. Pt would benefit from continue OT skilled services for ADL, mobility and safety in [facility] setting- if pt feels it is required. OT to follow acutely.      Follow Up Recommendations  Home health OT;Supervision - Intermittent    Equipment Recommendations  3 in 1 bedside commode;Tub/shower seat    Recommendations for Other Services       Precautions / Restrictions Precautions Precautions: Back Precaution Booklet Issued: Yes (comment) Precaution Comments: verbally went over Restrictions Weight Bearing Restrictions: No      Mobility Bed Mobility Overal bed mobility: Modified Independent                Transfers Overall transfer level: Modified independent Equipment used: Rolling walker (2 wheeled)             General transfer comment: requires 3in1 commode as pt's  commode is lower than the current commode in pt's room    Balance Overall balance assessment: Needs assistance   Sitting balance-Leahy Scale: Fair       Standing balance-Leahy Scale: Fair Standing balance comment: requiring RW for UE support                           ADL either performed or assessed with clinical judgement   ADL Overall ADL's : At baseline                                       General ADL Comments: Pt able to dress LB with no AE nor any AD. Pt with long arms to reach back for toilet hygiene and pull pants to waist with no assist. Pt set-upA for UB ADL.     Vision Baseline Vision/History: Wears glasses;No visual deficits Wears Glasses: At all times Vision Assessment?: No apparent visual deficits     Perception Perception Perception Tested?: Yes   Praxis      Pertinent Vitals/Pain Pain Assessment: 0-10 Pain Score: 7  Pain Location: low back Pain Descriptors / Indicators: Burning;Throbbing     Hand Dominance Right   Extremity/Trunk Assessment Upper Extremity Assessment Upper Extremity Assessment: Generalized weakness   Lower Extremity Assessment Lower Extremity Assessment: Generalized weakness   Cervical / Trunk Assessment Cervical / Trunk Assessment: Normal   Communication Communication Communication: No difficulties   Cognition Arousal/Alertness: Awake/alert Behavior During  Therapy: WFL for tasks assessed/performed Overall Cognitive Status: Within Functional Limits for tasks assessed                                     General Comments  Pt reports increased pain in low back s/p this current back surgery compared to previous surgeries.    Exercises     Shoulder Instructions      Home Living Family/patient expects to be discharged to:: Private residence Living Arrangements: Spouse/significant other;Children                                      Prior Functioning/Environment  Level of Independence: Independent with assistive device(s)        Comments: using SPC for mobility        OT Problem List: Decreased strength;Decreased activity tolerance;Impaired balance (sitting and/or standing);Pain      OT Treatment/Interventions: Self-care/ADL training;Therapeutic exercise;Neuromuscular education;Energy conservation;Balance training;DME and/or AE instruction    OT Goals(Current goals can be found in the care plan section) Acute Rehab OT Goals Patient Stated Goal: to go home safely OT Goal Formulation: With patient Time For Goal Achievement: 11/25/18 Potential to Achieve Goals: Good ADL Goals Pt Will Perform Lower Body Dressing: with modified independence;with adaptive equipment Additional ADL Goal #1: Pt will perform transfers from elevated surface with Modified independence.  OT Frequency: Min 2X/week   Barriers to D/C: Decreased caregiver support  Spouse works a lot.       Co-evaluation              AM-PAC OT "6 Clicks" Daily Activity     Outcome Measure Help from another person eating meals?: None Help from another person taking care of personal grooming?: None Help from another person toileting, which includes using toliet, bedpan, or urinal?: None Help from another person bathing (including washing, rinsing, drying)?: A Little Help from another person to put on and taking off regular upper body clothing?: None Help from another person to put on and taking off regular lower body clothing?: A Little 6 Click Score: 22   End of Session Equipment Utilized During Treatment: Rolling walker;Gait belt Nurse Communication: Mobility status  Activity Tolerance: Patient limited by pain Patient left: in chair;with call bell/phone within reach  OT Visit Diagnosis: Unsteadiness on feet (R26.81);Muscle weakness (generalized) (M62.81)                Time: 5462-7035 OT Time Calculation (min): 19 min Charges:  OT General Charges $OT Visit: 1 Visit OT  Evaluation $OT Eval Moderate Complexity: 1 Mod  Darryl Nestle) Marsa Aris OTR/L Acute Rehabilitation Services Pager: [phone] Office: [phone]   Fredda Hammed 11/11/2018, 9:05 AM

## 2018-11-11 NOTE — Progress Notes (Signed)
Subjective: Patient reports overall is doing okay no significant change and numbness in his lower abdomen and legs although no new numbness or tingling.  Objective: Vital signs in last 24 hours: Temp:  [97.5 F (36.4 C)-98.2 F (36.8 C)] 97.9 F (36.6 C) (03/19 0747) Pulse Rate:  [53-69] 57 (03/19 0747) Resp:  [14-21] 19 (03/19 0747) BP: (111-153)/(77-96) 111/77 (03/19 0747) SpO2:  [94 %-99 %] 98 % (03/19 0747) Weight:  [147.4 kg] 147.4 kg (03/18 1021)  Intake/Output from previous day: 03/18 0701 - 03/19 0700 In: 2910.2 [I.V.:2300; IV Piggyback:610.2] Out: 495 [Urine:270; Drains:125; Blood:100] Intake/Output this shift: No intake/output data recorded.  strength 5 out of 5 wound is clean dry and intact drain output minimal  Lab Results: No results for input(s): WBC, HGB, HCT, PLT in the last 72 hours. BMET No results for input(s): NA, K, CL, CO2, GLUCOSE, BUN, CREATININE, CALCIUM in the last 72 hours.  Studies/Results: Dg Thoracolumabar Spine  Result Date: 11/10/2018 CLINICAL DATA:  Intraoperative localization for T10-11 laminectomy. EXAM: THORACOLUMBAR SPINE 1V COMPARISON:  MRI of the thoracic spine on 10/12/2018 FINDINGS: In the frontal projection, first intraoperative film demonstrates a radiopaque object transversely oriented at what appears to be the T11-12 level. A second film demonstrates probe, retractor and sponge along the superior aspect of the T11 vertebral body at the expected position of the T10-11 disc space. IMPRESSION: Intraoperative localization of the T10-11 level during thoracic spinal surgery. Electronically Signed   By: Aletta Edouard M.D.   On: 11/10/2018 14:23   Dg Lumbar Spine 1 View  Result Date: 11/10/2018 CLINICAL DATA:  Lumbar spine surgery EXAM: LUMBAR SPINE - 1 VIEW COMPARISON:  Portable cross-table lateral intraoperative image at 1346 hours compared to MRI of 10/12/2018 FINDINGS: Prior MRI labeled with 5 lumbar vertebra. Curved metallic probe via  dorsal approach projects dorsal to the upper to mid L5 level. Tissue spreader is present at inferior L4 to mid L5 level. IMPRESSION: Metallic probe projects dorsal to the upper to mid L5 level. Electronically Signed   By: Lavonia Dana M.D.   On: 11/10/2018 15:39    Assessment/Plan: Postop day 1 from 2 large operations 1 being a transpedicular thoracic discectomy at T10-11 to being L3-S1 decompressive laminectomy. Overall she's doing fairly well I explained to him that his myelopathy is currently be slow to improve and may not noticed dramatic changes right away. Pain is still not controlled patient not really go home yet. We'll continue to work with physical and occupational therapy a full discharge tomorrow  LOS: 1 day     [place] 11/11/2018, 9:09 AM

## 2018-11-12 NOTE — Progress Notes (Signed)
Occupational Therapy Treatment Patient Details Name: Julian Lucas MRN: [id number] DOB: 05-29-1976 Today's Date: 11/12/2018    History of present illness Pt is a 43 yo male s/p thoracic laminectomy partial medial facectomy and foraminal L3, L4, L5 and S1 B/L with decompressive lumbar laminectomy. Pmhx: back surgeries.   OT comments  Pt performing sit to stand with bed elevated as it is at home. Pt performing tub transfer simulation with minguardA for support. Pt recalled correct information from tub transfer since last back surgery. Pt continues to be able to perform hip hiking and figure 4 technique to perform LB dressing without AE. Pt able to adhere to back precautions without verbal cueing. Pt supervision overall for mobility with RW and pain has decreased since last treatment session. Pt would benefit from continued OT skilled services in [facility] setting. OT signing off.     Follow Up Recommendations  Home health OT;Supervision - Intermittent    Equipment Recommendations  3 in 1 bedside commode;Tub/shower seat    Recommendations for Other Services      Precautions / Restrictions Precautions Precautions: Back Precaution Booklet Issued: Yes (comment) Precaution Comments: Reviewed handout. Pt was cued for precautions during functional mobility.  Restrictions Weight Bearing Restrictions: No       Mobility Bed Mobility Overal bed mobility: Needs Assistance             General bed mobility comments: Pt at EOB upon arrival  Transfers Overall transfer level: Needs assistance Equipment used: Rolling walker (2 wheeled) Transfers: Sit to/from Stand Sit to Stand: Supervision         General transfer comment: Supervision. Bed elevated for comfort.     Balance Overall balance assessment: Needs assistance Sitting-balance support: Bilateral upper extremity supported Sitting balance-Leahy Scale: Fair       Standing balance-Leahy Scale: Fair Standing balance comment:  requiring RW for UE support                           ADL either performed or assessed with clinical judgement   ADL Overall ADL's : At baseline                                       General ADL Comments: Practiced tub transfer and pt able to perform with simulation and 3in1 has been delivered. Pt able to reach BLEs for LB dressing.     Vision       Perception     Praxis      Cognition Arousal/Alertness: Awake/alert Behavior During Therapy: WFL for tasks assessed/performed Overall Cognitive Status: Within Functional Limits for tasks assessed                                          Exercises     Shoulder Instructions       General Comments Pt able to perform LB dressing without AE.    Pertinent Vitals/ Pain       Pain Assessment: Faces Faces Pain Scale: Hurts little more Pain Intervention(s): Monitored during session  Home Living  Prior Functioning/Environment              Frequency  Min 2X/week        Progress Toward Goals  OT Goals(current goals can now be found in the care plan section)  Progress towards OT goals: Progressing toward goals  Acute Rehab OT Goals Patient Stated Goal: to go home safely; decrease pain OT Goal Formulation: With patient Time For Goal Achievement: 11/25/18 Potential to Achieve Goals: Good ADL Goals Pt Will Perform Lower Body Dressing: with modified independence;with adaptive equipment Additional ADL Goal #1: Pt will perform transfers from elevated surface with Modified independence.  Plan Discharge plan remains appropriate    Co-evaluation                 AM-PAC OT "6 Clicks" Daily Activity     Outcome Measure   Help from another person eating meals?: None Help from another person taking care of personal grooming?: None Help from another person toileting, which includes using toliet, bedpan, or urinal?:  None Help from another person bathing (including washing, rinsing, drying)?: A Little Help from another person to put on and taking off regular upper body clothing?: None Help from another person to put on and taking off regular lower body clothing?: A Little 6 Click Score: 22    End of Session Equipment Utilized During Treatment: Rolling walker  OT Visit Diagnosis: Unsteadiness on feet (R26.81);Muscle weakness (generalized) (M62.81)   Activity Tolerance Patient tolerated treatment well;Patient limited by pain   Patient Left in bed;with call bell/phone within reach   Nurse Communication Mobility status        Time: 0810-0825 OT Time Calculation (min): 15 min  Charges: OT General Charges $OT Visit: 1 Visit OT Treatments $Self Care/Home Management : 8-22 mins  Darryl Nestle) Marsa Aris OTR/L Acute Rehabilitation Services Pager: [phone] Office: [phone]   Fredda Hammed 11/12/2018, 8:39 AM

## 2018-11-12 NOTE — Progress Notes (Signed)
Physical Therapy Treatment Patient Details Name: Julian Lucas MRN: [id number] DOB: 07-Sep-1975 Today's Date: 11/12/2018    History of Present Illness Pt is a 43 yo male s/p thoracic laminectomy partial medial facectomy and foraminal L3, L4, L5 and S1 B/L with decompressive lumbar laminectomy. Pmhx: back surgeries.    PT Comments    Pt progressing towards physical therapy goals. Was able to perform transfers and ambulation with gross supervision for safety and RW for support. Pt was educated on stair negotiation, car transfer, precautions, and activity progression. Will continue to follow.     Follow Up Recommendations  No PT follow up;Supervision - Intermittent     Equipment Recommendations  None recommended by PT    Recommendations for Other Services       Precautions / Restrictions Precautions Precautions: Back Precaution Booklet Issued: Yes (comment) Precaution Comments: Reviewed handout. Pt was cued for precautions during functional mobility.  Restrictions Weight Bearing Restrictions: No    Mobility  Bed Mobility Overal bed mobility: Needs Assistance Bed Mobility: Rolling;Sidelying to Sit Rolling: Supervision Sidelying to sit: Supervision       General bed mobility comments: Pt completed with HOB flat and rails lowered to simulate home environment.   Transfers Overall transfer level: Needs assistance Equipment used: Rolling walker (2 wheeled) Transfers: Sit to/from Stand Sit to Stand: Supervision         General transfer comment: Close supervision for safety. Bed elevated for comfort.  Ambulation/Gait Ambulation/Gait assistance: Supervision Gait Distance (Feet): 250 Feet Assistive device: Rolling walker (2 wheeled) Gait Pattern/deviations: Step-through pattern;Decreased stride length;Trunk flexed Gait velocity: Decreased Gait velocity interpretation: <1.8 ft/sec, indicate of risk for recurrent falls General Gait Details: Pt continued to be slow and  guarded due to pain however overall appears improved from yesterday.    Stairs Stairs: Yes Stairs assistance: Supervision Stair Management: One rail Right;Step to pattern;Forwards Number of Stairs: 10 General stair comments: VC's for sequencing and general safety. No assist required however supervision provided for safety.    Wheelchair Mobility    Modified Rankin (Stroke Patients Only)       Balance Overall balance assessment: Needs assistance Sitting-balance support: Bilateral upper extremity supported Sitting balance-Leahy Scale: Fair       Standing balance-Leahy Scale: Fair Standing balance comment: requiring RW for UE support                            Cognition Arousal/Alertness: Awake/alert Behavior During Therapy: WFL for tasks assessed/performed Overall Cognitive Status: Within Functional Limits for tasks assessed                                        Exercises      General Comments General comments (skin integrity, edema, etc.): Pt able to perform LB dressing without AE.      Pertinent Vitals/Pain Pain Assessment: 0-10 Pain Score: 6  Faces Pain Scale: Hurts little more Pain Location: low back Pain Descriptors / Indicators: Operative site guarding;Aching Pain Intervention(s): Monitored during session;Repositioned    Home Living                      Prior Function            PT Goals (current goals can now be found in the care plan section) Acute Rehab PT Goals Patient Stated Goal: to go  home safely; decrease pain PT Goal Formulation: With patient Time For Goal Achievement: 11/18/18 Potential to Achieve Goals: Good Progress towards PT goals: Progressing toward goals    Frequency    Min 5X/week      PT Plan Current plan remains appropriate    Co-evaluation              AM-PAC PT "6 Clicks" Mobility   Outcome Measure  Help needed turning from your back to your side while in a flat bed  without using bedrails?: None Help needed moving from lying on your back to sitting on the side of a flat bed without using bedrails?: None Help needed moving to and from a bed to a chair (including a wheelchair)?: A Little Help needed standing up from a chair using your arms (e.g., wheelchair or bedside chair)?: A Little Help needed to walk in hospital room?: A Little Help needed climbing 3-5 steps with a railing? : A Little 6 Click Score: 20    End of Session Equipment Utilized During Treatment: Gait belt Activity Tolerance: Patient limited by pain Patient left: with call bell/phone within reach(Sitting EOB awaiting OT) Nurse Communication: Mobility status PT Visit Diagnosis: Unsteadiness on feet (R26.81);Pain;Other symptoms and signs involving the nervous system (R29.898) Pain - part of body: (back, quads)     Time: 5189-8421 PT Time Calculation (min) (ACUTE ONLY): 14 min  Charges:  $Gait Training: 8-22 mins                     Rolinda Roan, PT, DPT Acute Rehabilitation Services Pager: [phone] Office: [phone]    Thelma Comp 11/12/2018, 9:25 AM

## 2018-11-12 NOTE — TOC Initial Note (Signed)
Transition of Care [facility]) - Initial/Assessment Note    Patient Details  Name: Julian Lucas MRN: [id number] Date of Birth: [birth year]  [facility]) CM/SW Contact:    Pollie Friar, RN Phone Number: 11/12/2018, 9:28 AM  Clinical Narrative:                   Expected Discharge Plan: [place] Barriers to Discharge: No Barriers Identified   Patient Goals and CMS Choice   CMS Medicare.gov Compare Post Acute Care list provided to:: Patient Choice offered to / list presented to : Patient  Expected Discharge Plan and Services Expected Discharge Plan: [place]   Discharge Planning Services: CM Consult Post Acute Care Choice: Home Health, Durable Medical Equipment   Expected Discharge Date: 11/12/18               DME Arranged: 3-N-1 DME Agency: [facility] HH Arranged: RN, OT HH Agency: [facility]  Prior Living Arrangements/Services   Lives with:: Spouse Patient language and need for interpreter reviewed:: Yes(no needs) Do you feel safe going back to the place where you live?: Yes      Need for Family Participation in Patient Care: Yes (Comment)(intermittent supervision) Care giver support system in place?: Yes (comment)(spouse can provide the supervision)   Criminal Activity/Legal Involvement Pertinent to Current Situation/Hospitalization: No - Comment as needed  Activities of Daily Living Home Assistive Devices/Equipment: Eyeglasses ADL Screening (condition at time of admission) Patient's cognitive ability adequate to safely complete daily activities?: Yes Is the patient deaf or have difficulty hearing?: No Does the patient have difficulty seeing, even when wearing glasses/contacts?: No Does the patient have difficulty concentrating, remembering, or making decisions?: No Patient able to express need for assistance with ADLs?: Yes Does the patient have difficulty dressing or bathing?: Yes Independently  performs ADLs?: No Communication: Independent Dressing (OT): Needs assistance Is this a change from baseline?: Change from baseline, expected to last <3days Grooming: Independent Feeding: Independent Bathing: Needs assistance Is this a change from baseline?: Change from baseline, expected to last <3 days Toileting: Needs assistance Is this a change from baseline?: Change from baseline, expected to last <3 days In/Out Bed: Needs assistance Is this a change from baseline?: Change from baseline, expected to last <3 days Does the patient have difficulty walking or climbing stairs?: Yes Weakness of Legs: Both Weakness of Arms/Hands: Both  Permission Sought/Granted                  Emotional Assessment Appearance:: Appears older than stated age Attitude/Demeanor/Rapport: Gracious Affect (typically observed): Accepting, Pleasant, Appropriate Orientation: : Oriented to Self, Oriented to Place, Oriented to  Time, Oriented to Situation   Psych Involvement: No (comment)  Admission diagnosis:  Stenosis Patient Active Problem List   Diagnosis Date Noted  . Cannabis use disorder, mild, abuse 09/02/2018  . Osteoarthritis of left knee, severe 02/26/2018  . Abdominal pain, right upper quadrant 02/09/2018  . Myelopathy ([place]) 11/23/2017  . 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 Right foot pain 11/09/2015  . Pain in joint, ankle and foot 11/09/2015  . Heart burn 11/18/2013  . Dysphagia, unspecified(787.20) 11/18/2013  . Cervical disc disorder with radiculopathy of cervical region 07/27/2013  . Sciatica 01/06/2013  . Chronic pain of multiple joints 09/09/2010  . UNEQUAL LEG LENGTH 08/09/2010  . Ankle pain, right 08/02/2010  . Depression 04/25/2010  . Genital herpes 11/21/2009  . Severe obesity (BMI >= 40) (HCC)  11/21/2009  . TOBACCO ABUSE 11/21/2009  . HYPERTENSION, BENIGN ESSENTIAL 11/21/2009   PCP:  Sela Hilding, MD Pharmacy:   [id number] - [place], [place] [address] Phone: [phone] Fax: [phone]     Social Determinants of Health (SDOH) Interventions    Readmission Risk Interventions No flowsheet data found.

## 2018-11-12 NOTE — Discharge Instructions (Signed)

## 2018-11-12 NOTE — Discharge Summary (Signed)
  Physician Discharge Summary  Patient ID: GEMAYEL MASCIO MRN: [id number] DOB/AGE: 1976/04/17 43 y.o. Estimated body mass index is 41.73 kg/m as calculated from the following:   Height as of this encounter: 6\' 2"  (1.88 m).   Weight as of this encounter: 147.4 kg.   Admit date: 11/10/2018 Discharge date: 11/12/2018  Admission Diagnoses:myelopathy thoracic stenosis and lumbar spinal stenosis  Discharge Diagnoses: same Active Problems:   Myelopathy [facility])   Discharged Condition: good  Hospital Course: patient admitted hospital underwent decompressive and for thoracic laminectomy and transpedicular discectomy T10-11 as well as lumbar decompressive laminectomy L3 to S1. Postop patient did very well recovered in the floor on the floor was angling and voiding spontaneously tolerating regular diet stable for discharge home. Patient will be discharged her scheduled follow-up in one to 2 weeks.  Consults: Significant Diagnostic Studies: Treatments:decompressive thoracic laminectomy with transpedicular discectomy T10-11 and decompressive lumbar laminectomy L3-S1 Discharge Exam: Blood pressure 136/86, pulse 62, temperature 97.7 F (36.5 C), temperature source Oral, resp. rate 19, height 6\' 2"  (1.88 m), weight (!) 147.4 kg, SpO2 97 %. Strength 5 out of 5 wound clean dry and intact  Disposition: home   Allergies as of 11/12/2018   No Known Allergies     Medication List    TAKE these medications   AMBULATORY NON FORMULARY MEDICATION Patient needs a heavy duty walking cane   cyclobenzaprine 10 MG tablet Commonly known as:  FLEXERIL Take 10 mg by mouth 3 (three) times daily as needed for muscle spasms.   DULoxetine 60 MG capsule Commonly known as:  CYMBALTA Take 1 capsule (60 mg total) by mouth every morning.   gabapentin 300 MG capsule Commonly known as:  NEURONTIN TAKE 1 CAPSULE BY MOUTH THREE TIMES A DAY What changed:    how much to take  how to take this  when to take  this  additional instructions   losartan 25 MG tablet Commonly known as:  COZAAR Take 1 tablet (25 mg total) by mouth daily.   QUEtiapine 25 MG tablet Commonly known as:  SEROQUEL Take 1 tablet (25 mg total) by mouth at bedtime.   ranitidine 150 MG tablet Commonly known as:  ZANTAC Take 1 tablet (150 mg total) by mouth 2 (two) times daily as needed for heartburn.   traMADol 50 MG tablet Commonly known as:  ULTRAM Take 1 tablet (50 mg total) by mouth every 12 (twelve) hours as needed.   Vitamin D (Ergocalciferol) 1.25 MG (50000 UT) Caps capsule Commonly known as:  DRISDOL TAKE 1 CAPSULE (50,000 UNITS TOTAL) BY MOUTH EVERY 7 (SEVEN) DAYS.        Signed: Jezabella Schriever P 11/12/2018, 8:25 AM

## 2018-11-12 NOTE — Progress Notes (Signed)
Patient is discharged from room 3C09 at this time. Alert and in stable condition. IV site d/c'd and instructions read to patient with understanding verbalized. Left unit via wheelchair with all belongings at side.  

## 2018-11-12 NOTE — TOC Transition Note (Signed)
Transition of Care [facility]) - CM/SW Discharge Note   Patient Details  Name: Julian Lucas MRN: [id number] Date of Birth: [birth year]  Transition of Care [facility]) CM/SW Contact:  Pollie Friar, RN Phone Number: 11/12/2018, 9:28 AM   Clinical Narrative:    Wife to provide transport home   Final next level of care: Home w Home Health Services Barriers to Discharge: No Barriers Identified   Patient Goals and CMS Choice   CMS Medicare.gov Compare Post Acute Care list provided to:: Patient Choice offered to / list presented to : Patient  Discharge Placement                       Discharge Plan and Services   Discharge Planning Services: CM Consult Post Acute Care Choice: Home Health, Durable Medical Equipment          DME Arranged: 3-N-1 DME Agency: [facility] HH Arranged: RN, OT [place] Agency: [place] with Alvis Lemmings accepted the referral   Social Determinants of Health (SDOH) Interventions     Readmission Risk Interventions No flowsheet data found.

## 2018-11-22 ENCOUNTER — Ambulatory Visit (HOSPITAL_COMMUNITY): Payer: Medicaid Other | Admitting: Licensed Clinical Social Worker

## 2018-11-24 ENCOUNTER — Other Ambulatory Visit: Payer: Self-pay

## 2018-11-25 MED ORDER — RANITIDINE HCL 150 MG PO TABS: 150.0000 mg | ORAL_TABLET | Freq: Two times a day (BID) | ORAL | 1 refills | Status: DC | PRN

## 2018-11-26 ENCOUNTER — Other Ambulatory Visit: Payer: Self-pay | Admitting: Family Medicine

## 2018-11-30 ENCOUNTER — Other Ambulatory Visit: Payer: Self-pay | Admitting: Student

## 2018-11-30 DIAGNOSIS — R0781 Pleurodynia: Secondary | ICD-10-CM

## 2018-12-02 ENCOUNTER — Ambulatory Visit
Admission: RE | Admit: 2018-12-02 | Discharge: 2018-12-02 | Disposition: A | Discharge status: Home / Self Care | Payer: Medicaid Other | Source: Ambulatory Visit | Attending: Student | Admitting: Student

## 2018-12-02 ENCOUNTER — Other Ambulatory Visit: Payer: Self-pay

## 2018-12-02 DIAGNOSIS — R0781 Pleurodynia: Secondary | ICD-10-CM

## 2018-12-15 ENCOUNTER — Other Ambulatory Visit: Payer: Self-pay | Admitting: Student

## 2018-12-15 DIAGNOSIS — M5414 Radiculopathy, thoracic region: Secondary | ICD-10-CM

## 2018-12-15 DIAGNOSIS — R29898 Other symptoms and signs involving the musculoskeletal system: Secondary | ICD-10-CM

## 2018-12-23 ENCOUNTER — Ambulatory Visit
Admission: RE | Admit: 2018-12-23 | Discharge: 2018-12-23 | Disposition: A | Discharge status: Home / Self Care | Payer: Medicaid Other | Source: Ambulatory Visit | Attending: Student | Admitting: Student

## 2018-12-23 ENCOUNTER — Other Ambulatory Visit: Payer: Self-pay

## 2018-12-23 DIAGNOSIS — R29898 Other symptoms and signs involving the musculoskeletal system: Secondary | ICD-10-CM

## 2018-12-23 DIAGNOSIS — M5414 Radiculopathy, thoracic region: Secondary | ICD-10-CM

## 2018-12-23 MED ORDER — GADOBUTROL 1 MMOL/ML IV SOLN
10.0000 mL | Freq: Once | INTRAVENOUS | Status: AC | PRN
  Administered 2018-12-23: 14:00:00 10 mL via INTRAVENOUS

## 2018-12-24 ENCOUNTER — Other Ambulatory Visit: Payer: Self-pay

## 2018-12-24 ENCOUNTER — Encounter (HOSPITAL_COMMUNITY): Payer: Self-pay | Admitting: Emergency Medicine

## 2018-12-24 ENCOUNTER — Emergency Department (HOSPITAL_BASED_OUTPATIENT_CLINIC_OR_DEPARTMENT_OTHER): Payer: Medicaid Other

## 2018-12-24 ENCOUNTER — Emergency Department (HOSPITAL_COMMUNITY)
Admission: EM | Admit: 2018-12-24 | Discharge: 2018-12-24 | Disposition: A | Discharge status: Home / Self Care | Payer: Medicaid Other | Attending: Emergency Medicine | Admitting: Emergency Medicine

## 2018-12-24 DIAGNOSIS — F1290 Cannabis use, unspecified, uncomplicated: Secondary | ICD-10-CM | POA: Diagnosis not present

## 2018-12-24 DIAGNOSIS — M79609 Pain in unspecified limb: Secondary | ICD-10-CM

## 2018-12-24 DIAGNOSIS — Z86711 Personal history of pulmonary embolism: Secondary | ICD-10-CM | POA: Insufficient documentation

## 2018-12-24 DIAGNOSIS — Z79899 Other long term (current) drug therapy: Secondary | ICD-10-CM | POA: Insufficient documentation

## 2018-12-24 DIAGNOSIS — F319 Bipolar disorder, unspecified: Secondary | ICD-10-CM | POA: Insufficient documentation

## 2018-12-24 DIAGNOSIS — F17210 Nicotine dependence, cigarettes, uncomplicated: Secondary | ICD-10-CM | POA: Diagnosis not present

## 2018-12-24 DIAGNOSIS — M7989 Other specified soft tissue disorders: Secondary | ICD-10-CM

## 2018-12-24 DIAGNOSIS — I82A11 Acute embolism and thrombosis of right axillary vein: Secondary | ICD-10-CM | POA: Insufficient documentation

## 2018-12-24 DIAGNOSIS — I10 Essential (primary) hypertension: Secondary | ICD-10-CM | POA: Insufficient documentation

## 2018-12-24 DIAGNOSIS — M25511 Pain in right shoulder: Secondary | ICD-10-CM | POA: Diagnosis present

## 2018-12-24 LAB — BASIC METABOLIC PANEL
Anion gap: 9 (ref 5–15)
BUN: 15 mg/dL (ref 6–20)
CO2: 24 mmol/L (ref 22–32)
Calcium: 9.4 mg/dL (ref 8.9–10.3)
Chloride: 107 mmol/L (ref 98–111)
Creatinine, Ser: 0.7 mg/dL (ref 0.61–1.24)
GFR calc Af Amer: >60 mL/min (ref >60)
GFR calc non Af Amer: >60 mL/min (ref >60)
Glucose, Bld: 105 mg/dL — ABNORMAL HIGH (ref 70–99)
Potassium: 4.7 mmol/L (ref 3.5–5.1)
Sodium: 140 mmol/L (ref 135–145)

## 2018-12-24 LAB — CBC WITH DIFFERENTIAL/PLATELET
Abs Immature Granulocytes: 0.05 10*3/uL (ref 0.00–0.07)
Basophils Absolute: 0 10*3/uL (ref 0.0–0.1)
Basophils Relative: 1 %
Eosinophils Absolute: 0.3 10*3/uL (ref 0.0–0.5)
Eosinophils Relative: 4 %
HCT: 43 % (ref 39.0–52.0)
Hemoglobin: 13.9 g/dL (ref 13.0–17.0)
Immature Granulocytes: 1 %
Lymphocytes Relative: 33 %
Lymphs Abs: 2.8 10*3/uL (ref 0.7–4.0)
MCH: 30.5 pg (ref 26.0–34.0)
MCHC: 32.3 g/dL (ref 30.0–36.0)
MCV: 94.5 fL (ref 80.0–100.0)
Monocytes Absolute: 1.1 10*3/uL — ABNORMAL HIGH (ref 0.1–1.0)
Monocytes Relative: 13 %
Neutro Abs: 4.2 10*3/uL (ref 1.7–7.7)
Neutrophils Relative %: 48 %
Platelets: 200 10*3/uL (ref 150–400)
RBC: 4.55 MIL/uL (ref 4.22–5.81)
RDW: 12.4 % (ref 11.5–15.5)
WBC: 8.5 10*3/uL (ref 4.0–10.5)
nRBC: 0 % (ref 0.0–0.2)

## 2018-12-24 MED ORDER — APIXABAN 5 MG PO TABS
10.0000 mg | ORAL_TABLET | Freq: Once | ORAL | Status: AC
  Administered 2018-12-24: 10 mg via ORAL
  Filled 2018-12-24: qty 2

## 2018-12-24 MED ORDER — MORPHINE SULFATE (PF) 4 MG/ML IV SOLN
4.0000 mg | Freq: Once | INTRAVENOUS | Status: AC
  Administered 2018-12-24: 4 mg via INTRAVENOUS
  Filled 2018-12-24: qty 1

## 2018-12-24 MED ORDER — ELIQUIS 5 MG VTE STARTER PACK: ORAL_TABLET | ORAL | 0 refills | Status: DC

## 2018-12-24 MED ORDER — OXYCODONE HCL 10 MG PO TABS: 10.0000 mg | ORAL_TABLET | Freq: Four times a day (QID) | ORAL | 0 refills | Status: AC

## 2018-12-24 MED FILL — ELIQUIS STARTER PACK 5 MG T: 5 | 30 days supply | Qty: 74 | Fill #0

## 2018-12-24 NOTE — ED Triage Notes (Signed)
Pt in with c/o R shoulder pain x a few days. States he fell 2 wks ago, but pain only recently began.

## 2018-12-24 NOTE — ED Notes (Signed)
Vascular Tech at bedside.  

## 2018-12-24 NOTE — ED Notes (Signed)
Patient verbalizes understanding of discharge instructions. Opportunity for questioning and answers were provided. Armband removed by staff, pt discharged from ED ambulatory to home.  

## 2018-12-24 NOTE — ED Notes (Signed)
Patient reports right shoulder pain X few days. He states he's wife said its "swollen and he needs to get it checked out."

## 2018-12-24 NOTE — Discharge Instructions (Signed)
Please call your doctor today or Monday to follow-up with them regarding your recently diagnosed upper extremity DVT.  They will need to follow-up with this and provide further refills of your medication.  Please return to the emergency department if you develop any new or worsening symptoms including chest pain, shortness of breath, or passing out.  Information on my medicine - ELIQUIS (apixaban)  This medication education was reviewed with me or my healthcare representative as part of my discharge preparation.  The pharmacist that spoke with me during my hospital stay was:  Wyatt Haste, [place]? Eliquis was prescribed to treat blood clots that may have been found in the veins of your legs (deep vein thrombosis) or in your lungs (pulmonary embolism) and to reduce the risk of them occurring again.  WHAT DO YOU NEED TO KNOW ABOUT ELIQUIS ? The starting dose is 10 mg (two 5 mg tablets) taken TWICE daily for the FIRST SEVEN (7) DAYS, then on (enter date)  5/9  the dose is reduced to ONE 5 mg tablet taken TWICE daily.  Eliquis may be taken with or without food.   Try to take the dose about the same time in the morning and in the evening. If you have difficulty swallowing the tablet whole please discuss with your pharmacist how to take the medication safely.  Take Eliquis exactly as prescribed and DO NOT stop taking Eliquis without talking to the doctor who prescribed the medication.  Stopping may increase your risk of developing a new blood clot.  Refill your prescription before you run out.  After discharge, you should have regular check-up appointments with your healthcare provider that is prescribing your Eliquis.    WHAT DO YOU DO IF YOU MISS A DOSE? If a dose of ELIQUIS is not taken at the scheduled time, take it as soon as possible on the same day and twice-daily administration should be resumed. The dose should not be doubled to make up for a  missed dose.  IMPORTANT SAFETY INFORMATION A possible side effect of Eliquis is bleeding. You should call your healthcare provider right away if you experience any of the following: Bleeding from an injury or your nose that does not stop. Unusual colored urine (red or dark brown) or unusual colored stools (red or black). Unusual bruising for unknown reasons. A serious fall or if you hit your head (even if there is no bleeding).  Some medicines may interact with Eliquis and might increase your risk of bleeding or clotting while on Eliquis. To help avoid this, consult your healthcare provider or pharmacist prior to using any new prescription or non-prescription medications, including herbals, vitamins, non-steroidal anti-inflammatory drugs (NSAIDs) and supplements.  This website has more information on Eliquis (apixaban): http://www.eliquis.com/eliquis/home  Do not drink alcohol, drive, operate machinery or participate in any other potentially dangerous activities while taking opiate pain medication as it may make you sleepy. Do not take this medication with any other sedating medications, either prescription or over-the-counter. If you were prescribed Percocet or Vicodin, do not take these with acetaminophen (Tylenol) as it is already contained within these medications and overdose of Tylenol is dangerous.   This medication is an opiate (or narcotic) pain medication and can be habit forming.  Use it as little as possible to achieve adequate pain control.  Do not use or use it with extreme caution if you have a history of opiate abuse or dependence. This medication is intended  for your use only - do not give any to anyone else and keep it in a secure place where nobody else, especially children, have access to it. It will also cause or worsen constipation, so you may want to consider taking an over-the-counter stool softener while you are taking this medication.

## 2018-12-24 NOTE — ED Provider Notes (Signed)
[facility] EMERGENCY DEPARTMENT Provider Note   CSN: [id number] Arrival date & time: 12/24/18  0901    History   Chief Complaint Chief Complaint  Patient presents with   Shoulder Pain    HPI Julian Lucas is a 43 y.o. male with history of hypertension, PE, recent laminectomy and foraminotomy on 11/10/2018 who presents with a 3 to 4-day history of swelling at the base of the right side of the neck.  He reports associated pain.  It radiates toward his shoulder and has trouble lifting his shoulder, however denies any joint pain.  He reports he fell 2 weeks ago and does not remember exactly how he fell, however he ports he did not have pain until a few days ago.  He does not think it was related to the fall.  He denies any numbness or tingling.  He denies any significant posterior neck pain.  Patient has been taking oxycodone and Flexeril at home for his back pain.  Patient had an MRI of his thoracic and lumbar regions yesterday which showed no acute findings and expected postop findings.     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    Chronic pain of multiple joints    DEGENERATIVE JOINT DISEASE, LEFT KNEE 09/09/2010   Qualifier: Diagnosis of  By: Oneida Alar MD, KARL     Depression    Family history of adverse reaction to anesthesia    mom takes a long time to wake up   GERD (gastroesophageal reflux disease)    HERPES GENITALIS 11/21/2009   Qualifier: Diagnosis of  By: Jeannine Kitten MD, Matthew     Hypertension    PULMONARY EMBOLISM, HX OF 11/21/2009   Qualifier: Diagnosis of  By: Tye Savoy MD, Bertram [place] LEG LENGTH 08/09/2010   Qualifier: Diagnosis of  By: Ernestina Patches MD, Remo Lipps      Patient Active Problem List   Diagnosis Date Noted   Cannabis use disorder, mild, abuse 09/02/2018   Osteoarthritis of left knee, severe 02/26/2018   Abdominal pain, right  upper quadrant 02/09/2018   Myelopathy ([place]) 11/23/2017   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   Right foot pain 11/09/2015   Pain in joint, ankle and foot 11/09/2015   Heart burn 11/18/2013   Dysphagia, unspecified(787.20) 11/18/2013   Cervical disc disorder with radiculopathy of cervical region 07/27/2013   Sciatica 01/06/2013   Chronic pain of multiple joints 09/09/2010   UNEQUAL LEG LENGTH 08/09/2010   Ankle pain, right 08/02/2010   Depression 04/25/2010   Genital herpes 11/21/2009   Severe obesity (BMI >= 40) ([place]) 11/21/2009   TOBACCO ABUSE 11/21/2009   HYPERTENSION, BENIGN ESSENTIAL 11/21/2009    Past Surgical History:  Procedure Laterality Date   ANTERIOR CERVICAL DECOMP/DISCECTOMY FUSION N/A 11/23/2017   Procedure: ANTERIOR CERVICAL DECOMPRESSION AND FUSION CERVICAL SIX-SEVEN;  Surgeon: Kary Kos, MD;  Location: [facility];  Service: Neurosurgery;  Laterality: N/A;  anterior   KNEE SURGERY     LUMBAR LAMINECTOMY/DECOMPRESSION MICRODISCECTOMY Left 11/10/2018   Procedure: Laminectomy and Foraminotomy - Lumbar Three-Lumbar Four - Lumbar Four-Lumbar Five - Lumbar Five-Sacral One, laminectomy and microdiskectomy Thoracic Ten-Thoracic Eleven - left;  Surgeon: Kary Kos, MD;  Location: [facility];  Service: Neurosurgery;  Laterality: Left;  Laminectomy and Foraminotomy - Lumbar Three-Lumbar Four - Lumbar Four-Lumbar Five - Lumbar Five-Sacral One, laminectomy and m   NECK  SURGERY     TONSILECTOMY, ADENOIDECTOMY, BILATERAL MYRINGOTOMY AND TUBES          Home Medications    Prior to Admission medications   Medication Sig Start Date End Date Taking? Authorizing Provider  cyclobenzaprine (FLEXERIL) 10 MG tablet Take 10 mg by mouth 3 (three) times daily as needed for muscle spasms.   Yes [provider]  DULoxetine (CYMBALTA) 60 MG capsule Take 1 capsule (60 mg total) by mouth every morning. 10/28/18  Yes Arfeen, Arlyce Harman, MD  gabapentin (NEURONTIN) 300 MG  capsule TAKE 1 CAPSULE BY MOUTH THREE TIMES A DAY Patient taking differently: Take 300 mg by mouth 3 (three) times daily.  10/12/18  Yes Sela Hilding, MD  losartan (COZAAR) 25 MG tablet Take 1 tablet (25 mg total) by mouth daily. 10/12/18  Yes Sela Hilding, MD  QUEtiapine (SEROQUEL) 25 MG tablet Take 1 tablet (25 mg total) by mouth at bedtime. 10/28/18  Yes Arfeen, Arlyce Harman, MD  AMBULATORY NON FORMULARY MEDICATION Patient needs a heavy duty walking cane 11/03/18   Dickie La, MD  Eliquis DVT/PE Starter Pack [facility] STARTER PACK) 5 MG TABS Take as directed on package: start with two-5mg  tablets twice daily for 7 days. On day 8, switch to one-5mg  tablet twice daily. 12/24/18   Matas Burrows, Bea Graff, PA-C  Oxycodone HCl 10 MG TABS Take 1 tablet (10 mg total) by mouth every 6 (six) hours. 12/24/18   Brynja Marker, Bea Graff, PA-C  ranitidine (ZANTAC) 150 MG tablet TAKE 1 TABLET (150 MG TOTAL) BY MOUTH 2 (TWO) TIMES DAILY AS NEEDED FOR HEARTBURN. Patient not taking: Reported on 12/24/2018 11/26/18   Sela Hilding, MD  traMADol (ULTRAM) 50 MG tablet Take 1 tablet (50 mg total) by mouth every 12 (twelve) hours as needed. Patient not taking: Reported on 12/24/2018 10/12/18   Sela Hilding, MD  Vitamin D, Ergocalciferol, (DRISDOL) 50000 units CAPS capsule TAKE 1 CAPSULE (50,000 UNITS TOTAL) BY MOUTH EVERY 7 (SEVEN) DAYS. Patient not taking: Reported on 12/24/2018 12/14/17   Sela Hilding, MD    Family History Family History  Problem Relation Age of Onset   Lung cancer Father    Alcohol abuse Father    Depression Mother    Hypertension Mother    Heart disease Mother     Social History Social History   Tobacco Use   Smoking status: Current Every Day Smoker    Types: Cigars   Smokeless tobacco: Never Used   Tobacco comment: 1-2 daily "Black and Milds"  Substance Use Topics   Alcohol use: Not Currently   Drug use: Yes    Types: Marijuana    Comment: 11/02/18     Allergies     Patient has no known allergies.   Review of Systems Review of Systems  Constitutional: Negative for chills and fever.  HENT: Negative for facial swelling and sore throat.   Respiratory: Negative for shortness of breath.   Cardiovascular: Negative for chest pain.  Gastrointestinal: Positive for abdominal pain (chronic). Negative for nausea and vomiting.  Genitourinary: Negative for dysuria.  Musculoskeletal: Positive for arthralgias, back pain and joint swelling.  Skin: Negative for rash and wound.  Neurological: Negative for headaches.  Psychiatric/Behavioral: The patient is not nervous/anxious.      Physical Exam Updated Vital Signs BP (!) 123/92    Pulse (!) 105    Temp 98.9 F (37.2 C) (Oral)    Resp 18    Wt (!) 147.4 kg    SpO2  97%    BMI 41.72 kg/m   Physical Exam Vitals signs and nursing note reviewed.  Constitutional:      General: He is not in acute distress.    Appearance: He is well-developed. He is not diaphoretic.  HENT:     Head: Normocephalic and atraumatic.     Mouth/Throat:     Pharynx: No oropharyngeal exudate.  Eyes:     General: No scleral icterus.       Right eye: No discharge.        Left eye: No discharge.     Conjunctiva/sclera: Conjunctivae normal.     Pupils: Pupils are equal, round, and reactive to light.  Neck:     Musculoskeletal: Normal range of motion and neck supple.     Thyroid: No thyromegaly.  Cardiovascular:     Rate and Rhythm: Regular rhythm.     Heart sounds: Normal heart sounds. No murmur. No friction rub. No gallop.   Pulmonary:     Effort: Pulmonary effort is normal. No respiratory distress.     Breath sounds: Normal breath sounds. No stridor. No wheezing or rales.  Abdominal:     General: Bowel sounds are normal. There is no distension.     Palpations: Abdomen is soft.     Tenderness: There is no abdominal tenderness. There is no guarding or rebound.  Musculoskeletal:     Comments: Swelling and tenderness in the  supraclavicular space extending into the right anterior aspect of the neck and towards the right axilla; no bony tenderness to the right shoulder; patient has pain in the swollen area when he lifts his arm  Lymphadenopathy:     Cervical: No cervical adenopathy.  Skin:    General: Skin is warm and dry.     Coloration: Skin is not pale.     Findings: No rash.  Neurological:     Mental Status: He is alert.     Coordination: Coordination normal.      ED Treatments / Results  Labs (all labs ordered are listed, but only abnormal results are displayed) Labs Reviewed  BASIC METABOLIC PANEL - Abnormal; Notable for the following components:      Result Value   Glucose, Bld 105 (*)    All other components within normal limits  CBC WITH DIFFERENTIAL/PLATELET - Abnormal; Notable for the following components:   Monocytes Absolute 1.1 (*)    All other components within normal limits    EKG None  Radiology Mr Thoracic Spine W Wo Contrast  Result Date: 12/23/2018 CLINICAL DATA:  Fall.  Pain.  Post surgical surveillance. EXAM: MRI THORACIC AND LUMBAR SPINE WITHOUT AND WITH CONTRAST TECHNIQUE: Multiplanar and multiecho pulse sequences of the thoracic and lumbar spine were obtained without and with intravenous contrast. CONTRAST:  Gadavist 10 mL. COMPARISON:  10/10/2018. FINDINGS: MRI THORACIC SPINE FINDINGS Alignment: Unchanged mild thoracic levoscoliosis. Straightening of the normal thoracic kyphosis. No anterolisthesis. Vertebrae: Diffusely diminished bone marrow T1 signal, non-specific. Cord:  Normal signal and morphology. Paraspinal and other soft tissues: RIGHT hepatic lesion is stable. Disc levels: Unchanged minor thoracic disc pathology from T1 through T10, and unremarkable T11-T12 At T10-11, satisfactory appearance status post LEFT laminectomy and transpedicular discectomy. Minor endplate enhancement is non worrisome. MRI LUMBAR SPINE FINDINGS Segmentation:  Standard. Alignment: Mild lumbar  levoscoliosis. Trace retrolisthesis noted previously is stable. Vertebrae: Diffusely diminished bone marrow signal intensity, stable. Conus medullaris: Extends to the L1 level and appears normal. Paraspinal and other soft tissues: Unremarkable appearing postsurgical  change in the dorsal soft tissues. Disc levels: T12-L1: Normal. L1-L2:  Bulky osteophytes.  No posterior disc protrusion. L2-L3: Disc bulging, LEFT extraforaminal disc herniation, along with central protrusion, stable. LEFT L2 neural impingement is possible. L3-L4: Advanced disc space narrowing. Central and rightward protrusion with posterior element hypertrophy. Mild stenosis is stable to slightly improved. Foraminal narrowing is improved. L4-L5: Moderate disc space narrowing, with central protrusion and posterior element hypertrophy. Satisfactory appearing dorsal laminectomy. Improved stenosis and subarticular zone narrowing. Noncompressive foraminal narrowing is improved. L5-S1: Moderate disc space narrowing with central protrusion and osseous spurring. Central laminectomy. Improved stenosis and subarticular zone narrowing. Moderate BILATERAL foraminal narrowing bilaterally, although improved. IMPRESSION: Satisfactory appearance status post LEFT T10-T11 laminectomy and discectomy. Improved appearance status post central laminectomy L4 and L5 along with BILATERAL foraminotomies. No concerning postsurgical features. No concerning interval development, specifically no disc herniation, compression fracture, spinal infection, or new areas of stenosis/disc herniation. Electronically Signed   By: Staci Righter M.D.   On: 12/23/2018 15:02   Mr Lumbar Spine W Wo Contrast  Result Date: 12/23/2018 CLINICAL DATA:  Fall.  Pain.  Post surgical surveillance. EXAM: MRI THORACIC AND LUMBAR SPINE WITHOUT AND WITH CONTRAST TECHNIQUE: Multiplanar and multiecho pulse sequences of the thoracic and lumbar spine were obtained without and with intravenous contrast.  CONTRAST:  Gadavist 10 mL. COMPARISON:  10/10/2018. FINDINGS: MRI THORACIC SPINE FINDINGS Alignment: Unchanged mild thoracic levoscoliosis. Straightening of the normal thoracic kyphosis. No anterolisthesis. Vertebrae: Diffusely diminished bone marrow T1 signal, non-specific. Cord:  Normal signal and morphology. Paraspinal and other soft tissues: RIGHT hepatic lesion is stable. Disc levels: Unchanged minor thoracic disc pathology from T1 through T10, and unremarkable T11-T12 At T10-11, satisfactory appearance status post LEFT laminectomy and transpedicular discectomy. Minor endplate enhancement is non worrisome. MRI LUMBAR SPINE FINDINGS Segmentation:  Standard. Alignment: Mild lumbar levoscoliosis. Trace retrolisthesis noted previously is stable. Vertebrae: Diffusely diminished bone marrow signal intensity, stable. Conus medullaris: Extends to the L1 level and appears normal. Paraspinal and other soft tissues: Unremarkable appearing postsurgical change in the dorsal soft tissues. Disc levels: T12-L1: Normal. L1-L2:  Bulky osteophytes.  No posterior disc protrusion. L2-L3: Disc bulging, LEFT extraforaminal disc herniation, along with central protrusion, stable. LEFT L2 neural impingement is possible. L3-L4: Advanced disc space narrowing. Central and rightward protrusion with posterior element hypertrophy. Mild stenosis is stable to slightly improved. Foraminal narrowing is improved. L4-L5: Moderate disc space narrowing, with central protrusion and posterior element hypertrophy. Satisfactory appearing dorsal laminectomy. Improved stenosis and subarticular zone narrowing. Noncompressive foraminal narrowing is improved. L5-S1: Moderate disc space narrowing with central protrusion and osseous spurring. Central laminectomy. Improved stenosis and subarticular zone narrowing. Moderate BILATERAL foraminal narrowing bilaterally, although improved. IMPRESSION: Satisfactory appearance status post LEFT T10-T11 laminectomy and  discectomy. Improved appearance status post central laminectomy L4 and L5 along with BILATERAL foraminotomies. No concerning postsurgical features. No concerning interval development, specifically no disc herniation, compression fracture, spinal infection, or new areas of stenosis/disc herniation. Electronically Signed   By: Staci Righter M.D.   On: 12/23/2018 15:02   Ue Venous Duplex ([facility] And [facility] Only)  Result Date: 12/24/2018 UPPER VENOUS STUDY  Indications: Swelling, and Pain Performing Technologist: Abram Sander RVS  Examination Guidelines: A complete evaluation includes B-mode imaging, spectral Doppler, color Doppler, and power Doppler as needed of all accessible portions of each vessel. Bilateral testing is considered an integral part of a complete examination. Limited examinations for reoccurring indications may be performed as noted.  Right Findings: +----------+------------+---------+-----------+----------+-----------------+  RIGHT      Compressible Phasicity Spontaneous Properties      Summary       +----------+------------+---------+-----------+----------+-----------------+  IJV            Full        Yes        Yes                                   +----------+------------+---------+-----------+----------+-----------------+  Subclavian     Full        Yes        Yes                                   +----------+------------+---------+-----------+----------+-----------------+  Axillary       None                                      Age Indeterminate  +----------+------------+---------+-----------+----------+-----------------+  Brachial       None                                      Age Indeterminate  +----------+------------+---------+-----------+----------+-----------------+  Radial         None                                      Age Indeterminate  +----------+------------+---------+-----------+----------+-----------------+  Ulnar          Full                                                          +----------+------------+---------+-----------+----------+-----------------+  Cephalic       Full                                                         +----------+------------+---------+-----------+----------+-----------------+  Basilic        Full                                                         +----------+------------+---------+-----------+----------+-----------------+  Left Findings: +----------+------------+---------+-----------+----------+-------+  LEFT       Compressible Phasicity Spontaneous Properties Summary  +----------+------------+---------+-----------+----------+-------+  Subclavian                 Yes        Yes                         +----------+------------+---------+-----------+----------+-------+  Summary:  Right: Findings consistent with age indeterminate deep vein thrombosis involving the right brachial veins, right radial veins and right axillary vein.  Left: No evidence  of thrombosis in the subclavian.  *See table(s) above for measurements and observations.  Diagnosing physician: Curt Jews MD Electronically signed by Curt Jews MD on 12/24/2018 at 12:00:03 PM.    Final     Procedures Procedures (including critical care time)  Medications Ordered in ED Medications  morphine 4 MG/ML injection 4 mg (4 mg Intravenous Given 12/24/18 1227)  apixaban (ELIQUIS) tablet 10 mg (10 mg Oral Given 12/24/18 1428)     Initial Impression / Assessment and Plan / ED Course  I have reviewed the triage vital signs and the nursing notes.  Pertinent labs & imaging results that were available during my care of the patient were reviewed by me and considered in my medical decision making (see chart for details).        Patient found to have right upper extremity DVT.  Patient has history of pulmonary embolism, however is no longer anticoagulated.  It was thought that this was postoperative after his last back surgery.  Patient denies any chest pain or shortness of breath at this time. Will  start Eliquis after discussion with vascular surgeon, Dr. Donnetta Hutching.  He did not feel patient needed to be admitted for IV anticoagulation.  I also discussed patient case with Margo Aye, NP with neurosurgery who gave clearance to start anticoagulation following patient surgery on 11/10/2018.  Pharmacy arrange for patient to have first 30 days given by transitions of care pharmacy.  Pharmacy also discussed anticoagulation directions and risks.  Patient will follow-up with PCP for further management of his DVT.  Patient given strict return precautions including chest pain, shortness of breath, syncope.  He understands and agrees with plan.  Patient also given a few days of oxycodone, as he has run out and was unable to contact his surgeon for refill.  Patient vitals stable throughout ED course and discharged in satisfactory condition.  Final Clinical Impressions(s) / ED Diagnoses   Final diagnoses:  Acute deep vein thrombosis (DVT) of axillary vein of right upper extremity [facility])    ED Discharge Orders         Ordered    Eliquis DVT/PE Starter Pack (ELIQUIS STARTER PACK) 5 MG TABS     12/24/18 1410    Oxycodone HCl 10 MG TABS  Every 6 hours     12/24/18 1444           Seanpatrick Maisano, Mona, PA-C 12/24/18 1802    Hayden Rasmussen, MD 12/25/18 [phone]

## 2018-12-24 NOTE — Progress Notes (Signed)
Upper extremity venous has been completed.   Preliminary results in CV Proc.   Abram Sander 12/24/2018 11:22 AM

## 2018-12-28 ENCOUNTER — Emergency Department (HOSPITAL_COMMUNITY): Payer: Medicaid Other

## 2018-12-28 ENCOUNTER — Other Ambulatory Visit: Payer: Self-pay

## 2018-12-28 ENCOUNTER — Emergency Department (HOSPITAL_COMMUNITY)
Admission: EM | Admit: 2018-12-28 | Discharge: 2018-12-28 | Disposition: A | Discharge status: Home / Self Care | Payer: Medicaid Other | Attending: Emergency Medicine | Admitting: Emergency Medicine

## 2018-12-28 DIAGNOSIS — M545 Low back pain: Secondary | ICD-10-CM | POA: Diagnosis not present

## 2018-12-28 DIAGNOSIS — F1290 Cannabis use, unspecified, uncomplicated: Secondary | ICD-10-CM | POA: Insufficient documentation

## 2018-12-28 DIAGNOSIS — F17290 Nicotine dependence, other tobacco product, uncomplicated: Secondary | ICD-10-CM | POA: Insufficient documentation

## 2018-12-28 DIAGNOSIS — R0602 Shortness of breath: Secondary | ICD-10-CM | POA: Diagnosis not present

## 2018-12-28 DIAGNOSIS — M79601 Pain in right arm: Secondary | ICD-10-CM | POA: Diagnosis not present

## 2018-12-28 DIAGNOSIS — Z79899 Other long term (current) drug therapy: Secondary | ICD-10-CM | POA: Diagnosis not present

## 2018-12-28 DIAGNOSIS — F319 Bipolar disorder, unspecified: Secondary | ICD-10-CM | POA: Insufficient documentation

## 2018-12-28 DIAGNOSIS — R0789 Other chest pain: Secondary | ICD-10-CM | POA: Diagnosis present

## 2018-12-28 DIAGNOSIS — Z86711 Personal history of pulmonary embolism: Secondary | ICD-10-CM | POA: Diagnosis not present

## 2018-12-28 DIAGNOSIS — M25511 Pain in right shoulder: Secondary | ICD-10-CM

## 2018-12-28 DIAGNOSIS — R079 Chest pain, unspecified: Secondary | ICD-10-CM

## 2018-12-28 LAB — COMPREHENSIVE METABOLIC PANEL
ALT: 10 U/L (ref 0–44)
AST: 15 U/L (ref 15–41)
Albumin: 3.2 g/dL — ABNORMAL LOW (ref 3.5–5.0)
Alkaline Phosphatase: 77 U/L (ref 38–126)
Anion gap: 9 (ref 5–15)
BUN: 13 mg/dL (ref 6–20)
CO2: 22 mmol/L (ref 22–32)
Calcium: 8.9 mg/dL (ref 8.9–10.3)
Chloride: 111 mmol/L (ref 98–111)
Creatinine, Ser: 0.64 mg/dL (ref 0.61–1.24)
GFR calc Af Amer: >60 mL/min (ref >60)
GFR calc non Af Amer: >60 mL/min (ref >60)
Glucose, Bld: 95 mg/dL (ref 70–99)
Potassium: 3.9 mmol/L (ref 3.5–5.1)
Sodium: 142 mmol/L (ref 135–145)
Total Bilirubin: 0.9 mg/dL (ref 0.3–1.2)
Total Protein: 6.5 g/dL (ref 6.5–8.1)

## 2018-12-28 LAB — CBC WITH DIFFERENTIAL/PLATELET
Abs Immature Granulocytes: 0.02 10*3/uL (ref 0.00–0.07)
Basophils Absolute: 0 10*3/uL (ref 0.0–0.1)
Basophils Relative: 1 %
Eosinophils Absolute: 0.2 10*3/uL (ref 0.0–0.5)
Eosinophils Relative: 2 %
HCT: 37.2 % — ABNORMAL LOW (ref 39.0–52.0)
Hemoglobin: 11.8 g/dL — ABNORMAL LOW (ref 13.0–17.0)
Immature Granulocytes: 0 %
Lymphocytes Relative: 42 %
Lymphs Abs: 2.8 10*3/uL (ref 0.7–4.0)
MCH: 30.3 pg (ref 26.0–34.0)
MCHC: 31.7 g/dL (ref 30.0–36.0)
MCV: 95.6 fL (ref 80.0–100.0)
Monocytes Absolute: 0.9 10*3/uL (ref 0.1–1.0)
Monocytes Relative: 13 %
Neutro Abs: 2.8 10*3/uL (ref 1.7–7.7)
Neutrophils Relative %: 42 %
Platelets: 216 10*3/uL (ref 150–400)
RBC: 3.89 MIL/uL — ABNORMAL LOW (ref 4.22–5.81)
RDW: 12.6 % (ref 11.5–15.5)
WBC: 6.6 10*3/uL (ref 4.0–10.5)
nRBC: 0 % (ref 0.0–0.2)

## 2018-12-28 LAB — TROPONIN I: Troponin I: <0.03 ng/mL (ref <0.03)

## 2018-12-28 MED ORDER — IOHEXOL 350 MG/ML SOLN
100.0000 mL | Freq: Once | INTRAVENOUS | Status: AC | PRN
  Administered 2018-12-28: 100 mL via INTRAVENOUS

## 2018-12-28 MED ORDER — MORPHINE SULFATE (PF) 4 MG/ML IV SOLN
4.0000 mg | Freq: Once | INTRAVENOUS | Status: AC
  Administered 2018-12-28: 4 mg via INTRAVENOUS
  Filled 2018-12-28: qty 1

## 2018-12-28 MED ORDER — TRAMADOL HCL 50 MG PO TABS
50.0000 mg | ORAL_TABLET | Freq: Once | ORAL | Status: AC
  Administered 2018-12-28: 50 mg via ORAL
  Filled 2018-12-28: qty 1

## 2018-12-28 NOTE — ED Notes (Signed)
Lab to send troponin result to ED, awaiting resultt

## 2018-12-28 NOTE — ED Notes (Signed)
Troponin result of <0.03, given to Ashok Cordia, MD.

## 2018-12-28 NOTE — ED Provider Notes (Signed)
[facility] EMERGENCY DEPARTMENT Provider Note   CSN: [id number] Arrival date & time: 12/28/18  1335    History   Chief Complaint Chief Complaint  Patient presents with   Arm Pain    HPI Julian Lucas is a 43 y.o. male.     The history is provided by the patient and medical records. No language interpreter was used.  Chest Pain  Pain location:  R lateral chest and R chest Pain quality: aching, crushing, pressure and sharp   Pain radiates to:  R shoulder and R arm Pain severity:  Severe Onset quality:  Gradual Duration:  2 days Timing:  Constant Progression:  Unchanged Chronicity:  New Context: breathing and at rest   Relieved by:  Nothing Worsened by:  Deep breathing and exertion Ineffective treatments:  None tried Associated symptoms: back pain and shortness of breath   Associated symptoms: no cough, no diaphoresis, no fatigue, no fever, no headache, no nausea, no palpitations and no vomiting   Risk factors: prior DVT/PE     Past Medical History:  Diagnosis Date   Anxiety    Bipolar disorder ([place])    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    Chronic pain of multiple joints    DEGENERATIVE JOINT DISEASE, LEFT KNEE 09/09/2010   Qualifier: Diagnosis of  By: Oneida Alar MD, KARL     Depression    Family history of adverse reaction to anesthesia    mom takes a long time to wake up   GERD (gastroesophageal reflux disease)    HERPES GENITALIS 11/21/2009   Qualifier: Diagnosis of  By: Jeannine Kitten MD, Matthew     Hypertension    PULMONARY EMBOLISM, HX OF 11/21/2009   Qualifier: Diagnosis of  By: Tye Savoy MD, Bertram [place] LEG LENGTH 08/09/2010   Qualifier: Diagnosis of  By: Ernestina Patches MD, Remo Lipps      Patient Active Problem List   Diagnosis Date Noted   Cannabis use disorder, mild, abuse 09/02/2018   Osteoarthritis of left knee, severe 02/26/2018   Abdominal pain, right upper quadrant 02/09/2018   Myelopathy ([place])  11/23/2017   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   Right foot pain 11/09/2015   Pain in joint, ankle and foot 11/09/2015   Heart burn 11/18/2013   Dysphagia, unspecified(787.20) 11/18/2013   Cervical disc disorder with radiculopathy of cervical region 07/27/2013   Sciatica 01/06/2013   Chronic pain of multiple joints 09/09/2010   UNEQUAL LEG LENGTH 08/09/2010   Ankle pain, right 08/02/2010   Depression 04/25/2010   Genital herpes 11/21/2009   Severe obesity (BMI >= 40) ([place]) 11/21/2009   TOBACCO ABUSE 11/21/2009   HYPERTENSION, BENIGN ESSENTIAL 11/21/2009    Past Surgical History:  Procedure Laterality Date   ANTERIOR CERVICAL DECOMP/DISCECTOMY FUSION N/A 11/23/2017   Procedure: ANTERIOR CERVICAL DECOMPRESSION AND FUSION CERVICAL SIX-SEVEN;  Surgeon: Kary Kos, MD;  Location: [place];  Service: Neurosurgery;  Laterality: N/A;  anterior   KNEE SURGERY     LUMBAR LAMINECTOMY/DECOMPRESSION MICRODISCECTOMY Left 11/10/2018   Procedure: Laminectomy and Foraminotomy - Lumbar Three-Lumbar Four - Lumbar Four-Lumbar Five - Lumbar Five-Sacral One, laminectomy and microdiskectomy Thoracic Ten-Thoracic Eleven - left;  Surgeon: Kary Kos, MD;  Location: [facility];  Service: Neurosurgery;  Laterality: Left;  Laminectomy and Foraminotomy - Lumbar Three-Lumbar Four - Lumbar Four-Lumbar Five - Lumbar Five-Sacral One, laminectomy and m   NECK SURGERY     TONSILECTOMY, ADENOIDECTOMY, BILATERAL MYRINGOTOMY AND TUBES  Home Medications    Prior to Admission medications   Medication Sig Start Date End Date Taking? Authorizing Provider  AMBULATORY NON FORMULARY MEDICATION Patient needs a heavy duty walking cane 11/03/18   Dickie La, MD  cyclobenzaprine (FLEXERIL) 10 MG tablet Take 10 mg by mouth 3 (three) times daily as needed for muscle spasms.    [provider]  DULoxetine (CYMBALTA) 60 MG capsule Take 1 capsule (60 mg total) by mouth every morning. 10/28/18    Arfeen, Arlyce Harman, MD  Eliquis DVT/PE Starter Pack (ELIQUIS STARTER PACK) 5 MG TABS Take as directed on package: start with two-5mg  tablets twice daily for 7 days. On day 8, switch to one-5mg  tablet twice daily. 12/24/18   Law, Bea Graff, PA-C  gabapentin (NEURONTIN) 300 MG capsule TAKE 1 CAPSULE BY MOUTH THREE TIMES A DAY Patient taking differently: Take 300 mg by mouth 3 (three) times daily.  10/12/18   Sela Hilding, MD  losartan (COZAAR) 25 MG tablet Take 1 tablet (25 mg total) by mouth daily. 10/12/18   Sela Hilding, MD  Oxycodone HCl 10 MG TABS Take 1 tablet (10 mg total) by mouth every 6 (six) hours. 12/24/18   Law, Bea Graff, PA-C  QUEtiapine (SEROQUEL) 25 MG tablet Take 1 tablet (25 mg total) by mouth at bedtime. 10/28/18   Arfeen, Arlyce Harman, MD  ranitidine (ZANTAC) 150 MG tablet TAKE 1 TABLET (150 MG TOTAL) BY MOUTH 2 (TWO) TIMES DAILY AS NEEDED FOR HEARTBURN. Patient not taking: Reported on 12/24/2018 11/26/18   Sela Hilding, MD  traMADol (ULTRAM) 50 MG tablet Take 1 tablet (50 mg total) by mouth every 12 (twelve) hours as needed. Patient not taking: Reported on 12/24/2018 10/12/18   Sela Hilding, MD  Vitamin D, Ergocalciferol, (DRISDOL) 50000 units CAPS capsule TAKE 1 CAPSULE (50,000 UNITS TOTAL) BY MOUTH EVERY 7 (SEVEN) DAYS. Patient not taking: Reported on 12/24/2018 12/14/17   Sela Hilding, MD    Family History Family History  Problem Relation Age of Onset   Lung cancer Father    Alcohol abuse Father    Depression Mother    Hypertension Mother    Heart disease Mother     Social History Social History   Tobacco Use   Smoking status: Current Every Day Smoker    Types: Cigars   Smokeless tobacco: Never Used   Tobacco comment: 1-2 daily "Black and Milds"  Substance Use Topics   Alcohol use: Not Currently   Drug use: Yes    Types: Marijuana    Comment: 11/02/18     Allergies   Patient has no known allergies.   Review of Systems Review of  Systems  Constitutional: Negative for chills, diaphoresis, fatigue and fever.  HENT: Negative for congestion.   Respiratory: Positive for shortness of breath. Negative for cough, choking, chest tightness and wheezing.   Cardiovascular: Positive for chest pain. Negative for palpitations and leg swelling.  Gastrointestinal: Negative for constipation, diarrhea, nausea and vomiting.  Genitourinary: Negative for flank pain.  Musculoskeletal: Positive for back pain. Negative for neck pain and neck stiffness.  Skin: Negative for rash and wound.  Neurological: Negative for light-headedness and headaches.  Psychiatric/Behavioral: Negative for agitation.     Physical Exam Updated Vital Signs BP 118/86    Pulse 79    Temp 98.2 F (36.8 C) (Oral)    Resp 19    SpO2 98%   Physical Exam Vitals signs and nursing note reviewed.  Constitutional:  General: He is not in acute distress.    Appearance: He is well-developed. He is not ill-appearing, toxic-appearing or diaphoretic.  HENT:     Head: Normocephalic and atraumatic.  Eyes:     Conjunctiva/sclera: Conjunctivae normal.     Pupils: Pupils are equal, round, and reactive to light.  Neck:     Musculoskeletal: Neck supple.  Cardiovascular:     Rate and Rhythm: Normal rate and regular rhythm.     Pulses: Normal pulses.     Heart sounds: No murmur.  Pulmonary:     Effort: Pulmonary effort is normal. No respiratory distress.     Breath sounds: Normal breath sounds.  Abdominal:     General: Abdomen is flat.     Palpations: Abdomen is soft.     Tenderness: There is no abdominal tenderness.  Musculoskeletal:        General: Tenderness present. No signs of injury.     Lumbar back: He exhibits tenderness.       Back:     Right lower leg: No edema.     Left lower leg: No edema.  Skin:    General: Skin is warm and dry.     Capillary Refill: Capillary refill takes less than 2 seconds.     Findings: No erythema.  Neurological:      General: No focal deficit present.     Mental Status: He is alert.  Psychiatric:        Mood and Affect: Mood normal.      ED Treatments / Results  Labs (all labs ordered are listed, but only abnormal results are displayed) Labs Reviewed  CBC WITH DIFFERENTIAL/PLATELET  COMPREHENSIVE METABOLIC PANEL  TROPONIN I  TROPONIN I    EKG None  Radiology No results found.  Procedures Procedures (including critical care time)  Medications Ordered in ED Medications  morphine 4 MG/ML injection 4 mg (4 mg Intravenous Given 12/28/18 1418)  iohexol (OMNIPAQUE) 350 MG/ML injection 100 mL (100 mLs Intravenous Contrast Given 12/28/18 1506)     Initial Impression / Assessment and Plan / ED Course  I have reviewed the triage vital signs and the nursing notes.  Pertinent labs & imaging results that were available during my care of the patient were reviewed by me and considered in my medical decision making (see chart for details).        Julian Lucas is a 43 y.o. male with a past medical history significant for obesity, hypertension, depression, prior pulmonary ballismus, and recent right upper extremity DVT discovered several days ago now on Eliquis who presents with right-sided chest pain and worsening right arm pain.  He reports that for the last 5 days has been taking his Eliquis as prescribed.  He reports that he had surgery recently on his low back and they think this DVT is from that.  He reports that he is had some right arm tingling but the swelling has improved.  He reports last night the pain changed from his arm and is now more in his proximal right shoulder/torso area.  He reports he is having the right-sided chest pain that is exertional and very pleuritic.  He is having shortness of breath with it.  He denies nausea vomiting, urinary symptoms or GI symptoms.  Denies any other trauma.  Reports the pain is severe.  He reports the oxycodone at home has had minimal help.  He denies any  neck pain at this time.  On exam, patient has metric  grip strength, normal radial pulses, and the subjective tingling sensation in his right arm compared to left.  Normal capillary refill.  No significant tenderness in his arm or chest.  Lungs clear and no murmur.  Abdomen nontender.  Back nontender.  Flanks nontender.  Mild tenderness in his low back at his surgical site but no evidence of infection on exam.  Due to patient's known DVT and change in pain to now his right chest that is pleuritic and exertional, will have work-up to look for pulmonary embolism.  Patient will have screening laboratory testing as well as the PE study.  Patient will be given pain medicine during initial work-up.  If CT scan does show pulmonary realism, patient may require admission for IV anticoagulation as this seems to have worsened while he was on the Eliquis.  If CT is negative, patient likely is having evolution of his DVT pain and will need escalation of his home pain regimen and outpatient follow-up.  Care transferred to oncoming team while awaiting results of CT scan and lab work.  Patient remains on room air.     Final Clinical Impressions(s) / ED Diagnoses   Final diagnoses:  Right-sided chest pain  Shortness of breath  Exertional shortness of breath     Clinical Impression: 1. Right-sided chest pain   2. Shortness of breath   3. Exertional shortness of breath     Disposition: Care transferred to Dr. Ashok Cordia while awaiting results of labs and CT scan to rule out pulmonary embolism.  Anticipate discharge if CT is reassuring.  This note was prepared with assistance of Systems analyst. Occasional wrong-word or sound-a-like substitutions may have occurred due to the inherent limitations of voice recognition software.      Malacai Grantz, Gwenyth Allegra, MD 12/28/18 [phone]

## 2018-12-28 NOTE — ED Triage Notes (Signed)
Pt in c/o R arm pain with recent dx of blood clot last week, reports compliance with Eliquis, pt denies SOB, pt denies fever, A&O x4

## 2018-12-28 NOTE — ED Notes (Signed)
Patient verbalizes understanding of discharge instructions. Opportunity for questioning and answers were provided. Armband removed by staff, pt discharged from ED. Follow up care reviewed.

## 2018-12-28 NOTE — Discharge Instructions (Signed)
It was our pleasure to provide your ER care today - we hope that you feel better.  Continue your anti-coagulation medication.   Take acetaminophen as need.   Follow up with primary care doctor in the next 2-3 days for recheck.  Return to ER if worse, new symptoms, fevers, persistent or recurrent chest pain, trouble breathing, other concern.   You were given pain medication in the ER - no driving for the next 4 hours.

## 2018-12-28 NOTE — ED Provider Notes (Addendum)
Signed out by Dr Sherry Ruffing to d/c to home if ct neg for PE.   CTA reviewed - neg for large/central PE (overall, limited study).   Labs reviewed - trop normal.  Patients pain is RUE/right shoulder. No persistent and/or pleuritic chest pain. No sob or increased wob. Recheck pt - currently hr 66, rr 14, pulse ox 98% room air, no increased wob or chest discomfort.   Additionally, patient recently started on anticoag therapy for dvt, states is compliant w therapy and has adequate supply. Denies abrupt or acute worsening of RUE swelling. Compartments of RUE soft, not tense. Radial pulse 2+. No numbness/weakness.  Pt currently appears stable for d/c.   rec close pcp f/u.  Return precautions provided.            Lajean Saver, MD 12/28/18 [phone]

## 2018-12-28 NOTE — ED Notes (Signed)
ED Provider at bedside. 

## 2018-12-28 NOTE — ED Notes (Signed)
Patient transported to CT 

## 2018-12-30 ENCOUNTER — Encounter: Payer: Self-pay | Admitting: Family Medicine

## 2018-12-30 ENCOUNTER — Ambulatory Visit: Payer: Medicaid Other | Admitting: Family Medicine

## 2018-12-30 ENCOUNTER — Other Ambulatory Visit: Payer: Self-pay

## 2018-12-30 VITALS — BP 122/78 | HR 69 | Wt 343.8 lb

## 2018-12-30 DIAGNOSIS — I82621 Acute embolism and thrombosis of deep veins of right upper extremity: Secondary | ICD-10-CM | POA: Diagnosis present

## 2018-12-30 DIAGNOSIS — I8290 Acute embolism and thrombosis of unspecified vein: Secondary | ICD-10-CM | POA: Diagnosis not present

## 2018-12-30 NOTE — Progress Notes (Signed)
   CC: ed f/u  HPI Still taking 2 tab BID of eliquis. Has not missed any doses. He has two packs. Wonders if it will be $600 with his medicaid. He facetimed his wife who showed me the box - 2 pack of 15 days each.   Had a PE in 2011 after his first back surgery. Has not seen hematology. He says he was in the ICU for 1 week. No fam hx of clotting. He is still smoking, and hadn't been moving around much after his neck surgery.   No signs of bleeding.   ROS: Denies CP, SOB, abdominal pain, dysuria, changes in BMs.   CC, SH/smoking status, and VS noted  Objective: BP 122/78   Pulse 69   Wt (!) 343 lb 12.8 oz (155.9 kg)   SpO2 98%   BMI 44.14 kg/m  Gen: NAD, alert, cooperative, and pleasant. HEENT: NCAT, EOMI, PERRL CV: RRR, no murmur Resp: CTAB, no wheezes, non-labored Ext: No edema, warm Neuro: Alert and oriented, Speech clear, No gross deficits  Assessment and plan:  VTE (venous thromboembolism) Continue eliquis, we will refill. Should be affordable with medicaid, per our pharmacy team. No signs of bleeding. Referred to heme for consideration of hypercoag workup and comment on lifelong (as this recurrent VTE) vs just treating this episode as both seemed to be provoked in the setting of surgeries.    Orders Placed This Encounter  Procedures  . Ambulatory referral to Hematology    Referral Priority:   Routine    Referral Type:   Consultation    Referral Reason:   Specialty Services Required    Requested Specialty:   Oncology    Number of Visits Requested:   1    No orders of the defined types were placed in this encounter.  Ralene Ok, MD, PGY3 12/31/2018 3:16 PM

## 2018-12-30 NOTE — Patient Instructions (Signed)
You need to eventually see the blood doctor, called a hematologist to determine how long you need blood thinners and if something is going on with your body to cause the clots. They should call you. Please don't miss any eliquis medication, call us when you have a few days left and we will refill.

## 2018-12-31 DIAGNOSIS — I8290 Acute embolism and thrombosis of unspecified vein: Secondary | ICD-10-CM | POA: Insufficient documentation

## 2018-12-31 NOTE — Assessment & Plan Note (Signed)
Continue eliquis, we will refill. Should be affordable with medicaid, per our pharmacy team. No signs of bleeding. Referred to heme for consideration of hypercoag workup and comment on lifelong (as this recurrent VTE) vs just treating this episode as both seemed to be provoked in the setting of surgeries.

## 2019-01-05 ENCOUNTER — Encounter: Payer: Self-pay | Admitting: Oncology

## 2019-01-05 ENCOUNTER — Telehealth: Payer: Self-pay | Admitting: Oncology

## 2019-01-05 NOTE — Telephone Encounter (Signed)
A new hem appt has been scheduled for the pt to see [name] on 5/28 at 2pm. Pt has been cld and given appt info. Aware to arrive 30 minutes early. Letter mailed.

## 2019-01-18 ENCOUNTER — Telehealth: Payer: Self-pay | Admitting: *Deleted

## 2019-01-18 MED ORDER — APIXABAN 5 MG PO TABS: 5.0000 mg | ORAL_TABLET | Freq: Two times a day (BID) | ORAL | 0 refills | Status: DC

## 2019-01-18 NOTE — Telephone Encounter (Signed)
Pt informed and appointment is Thursday @ 2pm, routing to PCP as an [place]. Minah Axelrod Zimmerman Rumple, CMA

## 2019-01-18 NOTE — Telephone Encounter (Signed)
Sent the full dose. Please let him know. Remind him that he should start this once he finishes the starter pack and NEVER miss any doses unless a doctor tells him to. Could you ask him if the hematologist's office have scheduled an appt for him yet?

## 2019-01-18 NOTE — Telephone Encounter (Signed)
Pt states Dr. Lindell Noe told him to call when he needs a refill on Eliquis (only the starter pack is on his list). Christen Bame, CMA

## 2019-01-20 ENCOUNTER — Other Ambulatory Visit: Payer: Self-pay

## 2019-01-20 ENCOUNTER — Inpatient Hospital Stay: Payer: Medicaid Other | Attending: Oncology | Admitting: Oncology

## 2019-01-20 ENCOUNTER — Inpatient Hospital Stay: Payer: Medicaid Other

## 2019-01-20 VITALS — BP 122/83 | HR 76 | Temp 98.3°F | Resp 18 | Ht 74.0 in | Wt 340.8 lb

## 2019-01-20 DIAGNOSIS — I82621 Acute embolism and thrombosis of deep veins of right upper extremity: Secondary | ICD-10-CM | POA: Insufficient documentation

## 2019-01-20 DIAGNOSIS — M25469 Effusion, unspecified knee: Secondary | ICD-10-CM | POA: Diagnosis not present

## 2019-01-20 DIAGNOSIS — Z86718 Personal history of other venous thrombosis and embolism: Secondary | ICD-10-CM | POA: Insufficient documentation

## 2019-01-20 DIAGNOSIS — Z7901 Long term (current) use of anticoagulants: Secondary | ICD-10-CM | POA: Diagnosis not present

## 2019-01-20 DIAGNOSIS — Z86711 Personal history of pulmonary embolism: Secondary | ICD-10-CM | POA: Diagnosis not present

## 2019-01-20 DIAGNOSIS — K219 Gastro-esophageal reflux disease without esophagitis: Secondary | ICD-10-CM | POA: Diagnosis not present

## 2019-01-20 DIAGNOSIS — I10 Essential (primary) hypertension: Secondary | ICD-10-CM | POA: Diagnosis not present

## 2019-01-20 DIAGNOSIS — Z79899 Other long term (current) drug therapy: Secondary | ICD-10-CM | POA: Insufficient documentation

## 2019-01-20 DIAGNOSIS — F319 Bipolar disorder, unspecified: Secondary | ICD-10-CM | POA: Diagnosis not present

## 2019-01-20 DIAGNOSIS — I8290 Acute embolism and thrombosis of unspecified vein: Secondary | ICD-10-CM

## 2019-01-20 DIAGNOSIS — F17210 Nicotine dependence, cigarettes, uncomplicated: Secondary | ICD-10-CM | POA: Diagnosis not present

## 2019-01-20 LAB — ANTITHROMBIN III: AntiThromb III Func: 95 % (ref 75–120)

## 2019-01-20 NOTE — Progress Notes (Signed)
Reason for the request: Deep vein thrombosis     HPI: I was asked by Dr. McDiarmid  to evaluate Mr. Julian Lucas for recurrent thromboembolism.  He is 43 year old man with history of fat degenerative disease and multiple back operations.  He underwent thoracic decompressive laminectomy of T10 and T11 under the care of Dr. Saintclair Halsted on March 20 of 2020.  He subsequently started developing right-sided arm pain and swelling and was evaluated in the emergency department on May 1 of 2020.  Vascular ultrasound of his right upper extremity showed deep vein thrombosis involving the right brachial vein, right radial veins as well as axillary veins.  CT scan of the chest obtained on Dec 28, 2018 did not show clear cut pulmonary embolism but with limited quality study.  He has been started on Xarelto since that time.  He did report a previous episode dating back to 2011 where he presented with acute chest pain and found to have deep vein thrombosis as well as pulmonary embolism following another surgery at that time.  He was treated with warfarin for about 6 months.  Clinically, he reports he has no issues related to Xarelto.  He denies any recurrent bleeding issues.  He denies any family history of recurrent thrombosis.  He denies any other constitutional symptoms.  He does report knee pain and swelling but his arm pain has improved.  He does not report any headaches, blurry vision, syncope or seizures. Does not report any fevers, chills or sweats.  Does not report any cough, wheezing or hemoptysis.  Does not report any chest pain, palpitation, orthopnea or leg edema.  Does not report any nausea, vomiting or abdominal pain.  Does not report any constipation or diarrhea.  Does not report any skeletal complaints.    Does not report frequency, urgency or hematuria.  Does not report any skin rashes or lesions. Does not report any heat or cold intolerance.  Does not report any lymphadenopathy or petechiae.  Does not report any anxiety  or depression.  Remaining review of systems is negative.    Past Medical History:  Diagnosis Date  . Anxiety   . Bipolar disorder ([place])   . 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 Chronic pain of multiple joints   . DEGENERATIVE JOINT DISEASE, LEFT KNEE 09/09/2010   Qualifier: Diagnosis of  By: Oneida Alar MD, KARL    . Depression   . Family history of adverse reaction to anesthesia    mom takes a long time to wake up  . GERD (gastroesophageal reflux disease)   . HERPES GENITALIS 11/21/2009   Qualifier: Diagnosis of  By: Jeannine Kitten MD, Rodman Key    . Hypertension   . PULMONARY EMBOLISM, HX OF 11/21/2009   Qualifier: Diagnosis of  By: Tye Savoy MD, Tommi Rumps    . UNEQUAL LEG LENGTH 08/09/2010   Qualifier: Diagnosis of  By: Ernestina Patches MD, Remo Lipps    :  Past Surgical History:  Procedure Laterality Date  . ANTERIOR CERVICAL DECOMP/DISCECTOMY FUSION N/A 11/23/2017   Procedure: ANTERIOR CERVICAL DECOMPRESSION AND FUSION CERVICAL SIX-SEVEN;  Surgeon: Kary Kos, MD;  Location: [place];  Service: Neurosurgery;  Laterality: N/A;  anterior  . KNEE SURGERY    . LUMBAR LAMINECTOMY/DECOMPRESSION MICRODISCECTOMY Left 11/10/2018   Procedure: Laminectomy and Foraminotomy - Lumbar Three-Lumbar Four - Lumbar Four-Lumbar Five - Lumbar Five-Sacral One, laminectomy and microdiskectomy Thoracic Ten-Thoracic Eleven - left;  Surgeon: Kary Kos, MD;  Location: [place];  Service: Neurosurgery;  Laterality: Left;  Laminectomy and Foraminotomy - Lumbar Three-Lumbar Four - Lumbar Four-Lumbar Five - Lumbar Five-Sacral One, laminectomy and m  . NECK SURGERY    . TONSILECTOMY, ADENOIDECTOMY, BILATERAL MYRINGOTOMY AND TUBES    :   Current Outpatient Medications:  .  AMBULATORY NON FORMULARY MEDICATION, Patient needs a heavy duty walking cane, Disp: 1 Units, Rfl: 0 .  apixaban (ELIQUIS) 5 MG TABS tablet, Take 1 tablet (5 mg total) by mouth 2 (two) times daily., Disp: 180 tablet, Rfl: 0 .  cyclobenzaprine (FLEXERIL) 10  MG tablet, Take 10 mg by mouth 3 (three) times daily as needed for muscle spasms., Disp: , Rfl:  .  DULoxetine (CYMBALTA) 60 MG capsule, Take 1 capsule (60 mg total) by mouth every morning., Disp: 90 capsule, Rfl: 0 .  Eliquis DVT/PE Starter Pack (ELIQUIS STARTER PACK) 5 MG TABS, Take as directed on package: start with two-5mg  tablets twice daily for 7 days. On day 8, switch to one-5mg  tablet twice daily., Disp: 1 each, Rfl: 0 .  gabapentin (NEURONTIN) 300 MG capsule, TAKE 1 CAPSULE BY MOUTH THREE TIMES A DAY (Patient taking differently: Take 300 mg by mouth 3 (three) times daily. ), Disp: 270 capsule, Rfl: 1 .  losartan (COZAAR) 25 MG tablet, Take 1 tablet (25 mg total) by mouth daily., Disp: 90 tablet, Rfl: 1 .  Oxycodone HCl 10 MG TABS, Take 1 tablet (10 mg total) by mouth every 6 (six) hours., Disp: 12 tablet, Rfl: 0 .  QUEtiapine (SEROQUEL) 25 MG tablet, Take 1 tablet (25 mg total) by mouth at bedtime., Disp: 90 tablet, Rfl: 0 .  ranitidine (ZANTAC) 150 MG tablet, TAKE 1 TABLET (150 MG TOTAL) BY MOUTH 2 (TWO) TIMES DAILY AS NEEDED FOR HEARTBURN. (Patient not taking: Reported on 12/24/2018), Disp: 60 tablet, Rfl: 1 .  traMADol (ULTRAM) 50 MG tablet, Take 1 tablet (50 mg total) by mouth every 12 (twelve) hours as needed. (Patient not taking: Reported on 12/24/2018), Disp: 60 tablet, Rfl: 0 .  Vitamin D, Ergocalciferol, (DRISDOL) 50000 units CAPS capsule, TAKE 1 CAPSULE (50,000 UNITS TOTAL) BY MOUTH EVERY 7 (SEVEN) DAYS. (Patient not taking: Reported on 12/24/2018), Disp: 8 capsule, Rfl: 0:  No Known Allergies:  Family History  Problem Relation Age of Onset  . Lung cancer Father   . Alcohol abuse Father   . Depression Mother   . Hypertension Mother   . Heart disease Mother   :  Social History   Socioeconomic History  . Marital status: Significant Other    Spouse name: Not on file  . Number of children: 2  . Years of education: Not on file  . Highest education level: High school graduate   Occupational History    Comment: unemployed  Social Needs  . Financial resource strain: Somewhat hard  . Food insecurity:    Worry: Sometimes true    Inability: Sometimes true  . Transportation needs:    Medical: No    Non-medical: No  Tobacco Use  . Smoking status: Current Every Day Smoker    Types: Cigars  . Smokeless tobacco: Never Used  . Tobacco comment: 1-2 daily "Black and Milds"  Substance and Sexual Activity  . Alcohol use: Not Currently  . Drug use: Yes    Types: Marijuana    Comment: 11/02/18  . Sexual activity: Not Currently  Lifestyle  . Physical activity:    Days per week: 0 days    Minutes per session: 0 min  . Stress: Rather much  Relationships  .  Social connections:    Talks on phone: Twice a week    Gets together: Never    Attends religious service: Never    Active member of club or organization: No    Attends meetings of clubs or organizations: Never    Relationship status: Not on file  . Intimate partner violence:    Fear of current or ex partner: No    Emotionally abused: No    Physically abused: No    Forced sexual activity: No  Other Topics Concern  . Not on file  Social History Narrative   ** Merged History Encounter **      :  Pertinent items are noted in HPI.  Exam: Blood pressure 122/83, pulse 76, temperature 98.3 F (36.8 C), temperature source Oral, resp. rate 18, height 6\' 2"  (1.88 m), weight (!) 340 lb 12.8 oz (154.6 kg), SpO2 99 %.  ECOG 1  General appearance: alert and cooperative appeared without distress. Head: atraumatic without any abnormalities. Eyes: conjunctivae/corneas clear. PERRL.  Sclera anicteric. Throat: lips, mucosa, and tongue normal; without oral thrush or ulcers. Resp: clear to auscultation bilaterally without rhonchi, wheezes or dullness to percussion. Cardio: regular rate and rhythm, S1, S2 normal, no murmur, click, rub or gallop GI: soft, non-tender; bowel sounds normal; no masses,  no  organomegaly Skin: Skin color, texture, turgor normal. No rashes or lesions Lymph nodes: Cervical, supraclavicular, and axillary nodes normal. Neurologic: Grossly normal without any motor, sensory or deep tendon reflexes. Musculoskeletal: No joint deformity or effusion.  CBC    Component Value Date/Time   WBC 6.6 12/28/2018 1405   RBC 3.89 (L) 12/28/2018 1405   HGB 11.8 (L) 12/28/2018 1405   HGB 14.6 10/12/2018 1415   HCT 37.2 (L) 12/28/2018 1405   HCT 42.7 10/12/2018 1415   PLT 216 12/28/2018 1405   PLT 224 10/12/2018 1415   MCV 95.6 12/28/2018 1405   MCV 90 10/12/2018 1415   MCH 30.3 12/28/2018 1405   MCHC 31.7 12/28/2018 1405   RDW 12.6 12/28/2018 1405   RDW 11.9 10/12/2018 1415   LYMPHSABS 2.8 12/28/2018 1405   MONOABS 0.9 12/28/2018 1405   EOSABS 0.2 12/28/2018 1405   BASOSABS 0.0 12/28/2018 1405     Chemistry      Component Value Date/Time   NA 142 12/28/2018 1644   NA 143 10/12/2018 1415   K 3.9 12/28/2018 1644   CL 111 12/28/2018 1644   CO2 22 12/28/2018 1644   BUN 13 12/28/2018 1644   BUN 15 10/12/2018 1415   CREATININE 0.64 12/28/2018 1644   CREATININE 0.69 07/27/2013 1029      Component Value Date/Time   CALCIUM 8.9 12/28/2018 1644   ALKPHOS 77 12/28/2018 1644   AST 15 12/28/2018 1644   ALT 10 12/28/2018 1644   BILITOT 0.9 12/28/2018 1644   BILITOT 0.7 10/12/2018 1415       Ct Angio Chest Pe W And/or Wo Contrast  Result Date: 12/28/2018 CLINICAL DATA:  PE suspected, history of PE, right upper extremity DVT EXAM: CT ANGIOGRAPHY CHEST WITH CONTRAST TECHNIQUE: Multidetector CT imaging of the chest was performed using the standard protocol during bolus administration of intravenous contrast. Multiplanar CT image reconstructions and MIPs were obtained to evaluate the vascular anatomy. CONTRAST:  177mL OMNIPAQUE IOHEXOL 350 MG/ML SOLN COMPARISON:  CT chest, 12/02/2018 FINDINGS: Cardiovascular: Examination is substantially limited by poor contrast bolus, main  pulmonary artery = 180 HU. Within this limitation, there is no obvious central or lobar pulmonary  embolism. The segmental to subsegmental vessels are inadequately evaluated to exclude pulmonary embolism. There is extensive stranding about the dilated subclavian and axillary vessels of the right upper extremity and a filling defect extending into the right brachiocephalic artery in keeping with known right upper extremity DVT (series 9, image 30, series 12, image 65). Cardiomegaly. No pericardial effusion. Mediastinum/Nodes: No enlarged mediastinal, hilar, or axillary lymph nodes. Thyroid gland, trachea, and esophagus demonstrate no significant findings. Lungs/Pleura: Lungs are clear. No pleural effusion or pneumothorax. Upper Abdomen: No acute abnormality. Musculoskeletal: No chest wall abnormality. No acute or significant osseous findings. Review of the MIP images confirms the above findings. IMPRESSION: 1. Examination is substantially limited by poor contrast bolus, main pulmonary artery = 180 HU. Within this limitation, there is no obvious central or lobar pulmonary embolism. The segmental to subsegmental vessels are inadequately evaluated to exclude pulmonary embolism. Consider technical repeat CT angiogram for V/Q examination to further evaluate if there is a high, persistent suspicion for pulmonary embolism. 2. There is extensive stranding about the dilated subclavian and axillary vessels of the right upper extremity and a filling defect extending into the right brachiocephalic artery in keeping with known right upper extremity DVT (series 9, image 30, series 12, image 65). Electronically Signed   By: Eddie Candle M.D.   On: 12/28/2018 15:43   Mr Thoracic Spine W Wo Contrast  Result Date: 12/23/2018 CLINICAL DATA:  Fall.  Pain.  Post surgical surveillance. EXAM: MRI THORACIC AND LUMBAR SPINE WITHOUT AND WITH CONTRAST TECHNIQUE: Multiplanar and multiecho pulse sequences of the thoracic and lumbar spine were  obtained without and with intravenous contrast. CONTRAST:  Gadavist 10 mL. COMPARISON:  10/10/2018. FINDINGS: MRI THORACIC SPINE FINDINGS Alignment: Unchanged mild thoracic levoscoliosis. Straightening of the normal thoracic kyphosis. No anterolisthesis. Vertebrae: Diffusely diminished bone marrow T1 signal, non-specific. Cord:  Normal signal and morphology. Paraspinal and other soft tissues: RIGHT hepatic lesion is stable. Disc levels: Unchanged minor thoracic disc pathology from T1 through T10, and unremarkable T11-T12 At T10-11, satisfactory appearance status post LEFT laminectomy and transpedicular discectomy. Minor endplate enhancement is non worrisome. MRI LUMBAR SPINE FINDINGS Segmentation:  Standard. Alignment: Mild lumbar levoscoliosis. Trace retrolisthesis noted previously is stable. Vertebrae: Diffusely diminished bone marrow signal intensity, stable. Conus medullaris: Extends to the L1 level and appears normal. Paraspinal and other soft tissues: Unremarkable appearing postsurgical change in the dorsal soft tissues. Disc levels: T12-L1: Normal. L1-L2:  Bulky osteophytes.  No posterior disc protrusion. L2-L3: Disc bulging, LEFT extraforaminal disc herniation, along with central protrusion, stable. LEFT L2 neural impingement is possible. L3-L4: Advanced disc space narrowing. Central and rightward protrusion with posterior element hypertrophy. Mild stenosis is stable to slightly improved. Foraminal narrowing is improved. L4-L5: Moderate disc space narrowing, with central protrusion and posterior element hypertrophy. Satisfactory appearing dorsal laminectomy. Improved stenosis and subarticular zone narrowing. Noncompressive foraminal narrowing is improved. L5-S1: Moderate disc space narrowing with central protrusion and osseous spurring. Central laminectomy. Improved stenosis and subarticular zone narrowing. Moderate BILATERAL foraminal narrowing bilaterally, although improved. IMPRESSION: Satisfactory  appearance status post LEFT T10-T11 laminectomy and discectomy. Improved appearance status post central laminectomy L4 and L5 along with BILATERAL foraminotomies. No concerning postsurgical features. No concerning interval development, specifically no disc herniation, compression fracture, spinal infection, or new areas of stenosis/disc herniation. Electronically Signed   By: Staci Righter M.D.   On: 12/23/2018 15:02   Mr Lumbar Spine W Wo Contrast  Result Date: 12/23/2018 CLINICAL DATA:  Fall.  Pain.  Post surgical  surveillance. EXAM: MRI THORACIC AND LUMBAR SPINE WITHOUT AND WITH CONTRAST TECHNIQUE: Multiplanar and multiecho pulse sequences of the thoracic and lumbar spine were obtained without and with intravenous contrast. CONTRAST:  Gadavist 10 mL. COMPARISON:  10/10/2018. FINDINGS: MRI THORACIC SPINE FINDINGS Alignment: Unchanged mild thoracic levoscoliosis. Straightening of the normal thoracic kyphosis. No anterolisthesis. Vertebrae: Diffusely diminished bone marrow T1 signal, non-specific. Cord:  Normal signal and morphology. Paraspinal and other soft tissues: RIGHT hepatic lesion is stable. Disc levels: Unchanged minor thoracic disc pathology from T1 through T10, and unremarkable T11-T12 At T10-11, satisfactory appearance status post LEFT laminectomy and transpedicular discectomy. Minor endplate enhancement is non worrisome. MRI LUMBAR SPINE FINDINGS Segmentation:  Standard. Alignment: Mild lumbar levoscoliosis. Trace retrolisthesis noted previously is stable. Vertebrae: Diffusely diminished bone marrow signal intensity, stable. Conus medullaris: Extends to the L1 level and appears normal. Paraspinal and other soft tissues: Unremarkable appearing postsurgical change in the dorsal soft tissues. Disc levels: T12-L1: Normal. L1-L2:  Bulky osteophytes.  No posterior disc protrusion. L2-L3: Disc bulging, LEFT extraforaminal disc herniation, along with central protrusion, stable. LEFT L2 neural impingement is  possible. L3-L4: Advanced disc space narrowing. Central and rightward protrusion with posterior element hypertrophy. Mild stenosis is stable to slightly improved. Foraminal narrowing is improved. L4-L5: Moderate disc space narrowing, with central protrusion and posterior element hypertrophy. Satisfactory appearing dorsal laminectomy. Improved stenosis and subarticular zone narrowing. Noncompressive foraminal narrowing is improved. L5-S1: Moderate disc space narrowing with central protrusion and osseous spurring. Central laminectomy. Improved stenosis and subarticular zone narrowing. Moderate BILATERAL foraminal narrowing bilaterally, although improved. IMPRESSION: Satisfactory appearance status post LEFT T10-T11 laminectomy and discectomy. Improved appearance status post central laminectomy L4 and L5 along with BILATERAL foraminotomies. No concerning postsurgical features. No concerning interval development, specifically no disc herniation, compression fracture, spinal infection, or new areas of stenosis/disc herniation. Electronically Signed   By: Staci Righter M.D.   On: 12/23/2018 15:02     Assessment and Plan:   43 year old man with the following:  1.  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  He had a similar episode in 2011 and was anticoagulated with warfarin for 6 months.  The natural course of acquired and inherited thrombophilia as well as other etiologies for his recurrent thrombosis were reviewed.  Given the fact that this occurred on 2 separate occasions but have him at a high risk of developing recurrent thrombosis in the future with high risk of life-threatening pulmonary emboli.  I recommended obtaining hypercoagulable work-up to fully identify whether he has inherited or acquired thrombophilia.  He is agreeable to proceed with this evaluation at this time.  In terms of the duration of anticoagulation I  would recommend longer than 6 months of anticoagulation.  Depending on the work-up, recommendation of lifetime anticoagulation would be also reasonable given his recurrent thrombosis and high risk of having another blood clot that could be potentially lethal.  For the time being I have recommended completing at least 6 months of therapy and will evaluate the duration of therapy in 6 months.  He understands a longer anticoagulation for a year or potentially lifetime may be possible.  2.  Follow-up: He will return in December 2020 to determine the duration of his anticoagulation after completing 6 months of therapy.  40  minutes was spent with the patient face-to-face today.  More than 50% of time was spent on reviewing his imaging studies, laboratory data and answering questions regarding future plan  of care.     Thank you for the referral.  A copy of this consult has been forwarded to the requesting physician.

## 2019-01-21 LAB — BETA-2-GLYCOPROTEIN I ABS, IGG/M/A
Beta-2 Glyco I IgG: <9 GPI IgG units (ref 0–20)
Beta-2-Glycoprotein I IgA: <9 GPI IgA units (ref 0–25)
Beta-2-Glycoprotein I IgM: <9 GPI IgM units (ref 0–32)

## 2019-01-21 LAB — LUPUS ANTICOAGULANT PANEL
DRVVT: 52 s — ABNORMAL HIGH (ref 0.0–47.0)
PTT Lupus Anticoagulant: 33.4 s (ref 0.0–51.9)

## 2019-01-21 LAB — CARDIOLIPIN ANTIBODIES, IGG, IGM, IGA
Anticardiolipin IgA: <9 APL U/mL (ref 0–11)
Anticardiolipin IgG: 9 GPL U/mL (ref 0–14)
Anticardiolipin IgM: <9 MPL U/mL (ref 0–12)

## 2019-01-21 LAB — PROTEIN C ACTIVITY: Protein C Activity: 98 % (ref 73–180)

## 2019-01-21 LAB — HOMOCYSTEINE: Homocysteine: 12.1 umol/L (ref 0.0–14.5)

## 2019-01-21 LAB — PROTEIN S, TOTAL: Protein S Ag, Total: 81 % (ref 60–150)

## 2019-01-21 LAB — PROTEIN S ACTIVITY: Protein S Activity: 109 % (ref 63–140)

## 2019-01-21 LAB — DRVVT MIX: dRVVT Mix: 42.2 s (ref 0.0–47.0)

## 2019-01-22 ENCOUNTER — Other Ambulatory Visit (HOSPITAL_COMMUNITY): Payer: Self-pay | Admitting: Psychiatry

## 2019-01-22 DIAGNOSIS — F0630 Mood disorder due to known physiological condition, unspecified: Secondary | ICD-10-CM

## 2019-01-22 DIAGNOSIS — F331 Major depressive disorder, recurrent, moderate: Secondary | ICD-10-CM

## 2019-01-22 LAB — PROTEIN C, TOTAL: Protein C, Total: 73 % (ref 60–150)

## 2019-01-25 LAB — PROTHROMBIN GENE MUTATION

## 2019-01-27 ENCOUNTER — Ambulatory Visit (INDEPENDENT_AMBULATORY_CARE_PROVIDER_SITE_OTHER): Payer: Self-pay | Admitting: Psychiatry

## 2019-01-27 ENCOUNTER — Other Ambulatory Visit: Payer: Self-pay

## 2019-01-27 ENCOUNTER — Encounter (HOSPITAL_COMMUNITY): Payer: Self-pay | Admitting: Psychiatry

## 2019-01-27 DIAGNOSIS — F1210 Cannabis abuse, uncomplicated: Secondary | ICD-10-CM

## 2019-01-27 DIAGNOSIS — F0630 Mood disorder due to known physiological condition, unspecified: Secondary | ICD-10-CM

## 2019-01-27 DIAGNOSIS — F331 Major depressive disorder, recurrent, moderate: Secondary | ICD-10-CM

## 2019-01-27 LAB — FACTOR 5 LEIDEN

## 2019-01-27 MED ORDER — QUETIAPINE FUMARATE 25 MG PO TABS: 25.0000 mg | ORAL_TABLET | Freq: Every day | ORAL | 0 refills | Status: DC

## 2019-01-27 MED ORDER — DULOXETINE HCL 60 MG PO CPEP: 60.0000 mg | ORAL_CAPSULE | ORAL | 0 refills | Status: DC
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
## 2019-01-31 ENCOUNTER — Other Ambulatory Visit: Payer: Self-pay

## 2019-01-31 ENCOUNTER — Ambulatory Visit (INDEPENDENT_AMBULATORY_CARE_PROVIDER_SITE_OTHER): Payer: Medicaid Other | Admitting: Licensed Clinical Social Worker

## 2019-01-31 ENCOUNTER — Encounter (HOSPITAL_COMMUNITY): Payer: Self-pay | Admitting: Licensed Clinical Social Worker

## 2019-01-31 DIAGNOSIS — F331 Major depressive disorder, recurrent, moderate: Secondary | ICD-10-CM | POA: Diagnosis not present

## 2019-01-31 NOTE — Progress Notes (Signed)
Virtual Visit via Phone Note  I connected with Julian Lucas on 01/31/19 at  4:00 PM EDT by a phone enabled telemedicine application and verified that I am speaking with the correct person using two identifiers. Pt was unable to connect with webex today. Will try at next session.   I discussed the limitations of evaluation and management by telemedicine and the availability of in person appointments. The patient expressed understanding and agreed to proceed.  History of Present Illness: Pt was referred to therapy by his psychiatrist at [facility] for depression    Observations/Objective: Pt presents in intense pain and depression for his phone session. Patient discussed his psychiatric symptoms and current life events.Pt has not been to therapy since early March due to back surgery and subsequent health concerns. He since has been referred to the [facility] [facility], where they took blood samples. He has not received the results of his blood work. Pt discussed his desire to come back to therapy to process his current life events: surgery, health consequences, inability to support himself and his family, future plans. Reviewed tx plan and pt verbalized understanding. Pt will call the front desk to make subsequent appointments.    Assessment and Plan:   I discussed the assessment and treatment plan with the patient. The patient was provided an opportunity to ask questions and all were answered. The patient agreed with the plan and demonstrated an understanding of the instructions.   The patient was advised to call back or seek an in-person evaluation if the symptoms worsen or if the condition fails to improve as anticipated.  I provided 45 minutes of non-face-to-face time during this encounter.   MACKENZIE,LISBETH S, LCAS

## 2019-02-09 ENCOUNTER — Telehealth: Payer: Self-pay

## 2019-02-09 NOTE — Telephone Encounter (Signed)
Contacted patient and made him aware that he is to remain on Xarelto until next visit on 08/12/2019 at 3pm. Patient verbalized understanding and had no other questions or concerns.

## 2019-02-09 NOTE — Telephone Encounter (Signed)
-----   Message from Wyatt Portela, MD sent at 02/09/2019 10:51 AM EDT ----- Regarding: RE: lab results All his labs are normal. He is to stay on Xarelto till 07/2019 when I see him.  ----- Message ----- From: Scot Dock, RN Sent: 02/09/2019  10:29 AM EDT To: Wyatt Portela, MD Subject: lab results                                    He is requesting lab results from 01/20/19 visit.

## 2019-02-11 ENCOUNTER — Other Ambulatory Visit: Payer: Self-pay | Admitting: Student

## 2019-02-11 ENCOUNTER — Ambulatory Visit
Admission: RE | Admit: 2019-02-11 | Discharge: 2019-02-11 | Disposition: A | Discharge status: Home / Self Care | Payer: Medicaid Other | Source: Ambulatory Visit | Attending: Student | Admitting: Student

## 2019-02-11 DIAGNOSIS — R1084 Generalized abdominal pain: Secondary | ICD-10-CM

## 2019-02-11 MED ORDER — IOPAMIDOL (ISOVUE-300) INJECTION 61%
125.0000 mL | Freq: Once | INTRAVENOUS | Status: AC | PRN
  Administered 2019-02-11: 125 mL via INTRAVENOUS

## 2019-02-15 ENCOUNTER — Encounter (HOSPITAL_COMMUNITY): Payer: Self-pay | Admitting: Licensed Clinical Social Worker

## 2019-02-15 ENCOUNTER — Other Ambulatory Visit: Payer: Self-pay

## 2019-02-15 ENCOUNTER — Ambulatory Visit (INDEPENDENT_AMBULATORY_CARE_PROVIDER_SITE_OTHER): Payer: Medicaid Other | Admitting: Licensed Clinical Social Worker

## 2019-02-15 DIAGNOSIS — F331 Major depressive disorder, recurrent, moderate: Secondary | ICD-10-CM | POA: Diagnosis not present

## 2019-02-15 NOTE — Progress Notes (Signed)
Virtual Visit via Video Note  I connected with Elba Barman on 02/15/19 at  4:00 PM EDT by a video enabled telemedicine application and verified that I am speaking with the correct person using two identifiers.    I discussed the limitations of evaluation and management by telemedicine and the availability of in person appointments. The patient expressed understanding and agreed to proceed.  History of Present Illness: Pt was referred to therapy by his psychiatrist at [facility] for depression    Observations/Objective: Pt presents in intense pain and depression for his video webex session. Patient discussed his psychiatric symptoms and current life events. Patient invited his girlfriend to join the webex session as he is in intense pain. He continues to have pain since the back surgery so doctors continue with testing to determine the causes. Pt discussed he has to have another test on his kidneys. Asked open ended questions and used empathic reflection. Pt talked about his fear. Pt's girlfriend got on the session to discuss at length the prognoses of all his tests and back surgery. Discussed future appointments.    Assessment and Plan:   I discussed the assessment and treatment plan with the patient. The patient was provided an opportunity to ask questions and all were answered. The patient agreed with the plan and demonstrated an understanding of the instructions.   The patient was advised to call back or seek an in-person evaluation if the symptoms worsen or if the condition fails to improve as anticipated.  I provided 40 minutes of non-face-to-face time during this encounter.   Quirino Kakos S, LCAS

## 2019-02-18 ENCOUNTER — Other Ambulatory Visit: Payer: Self-pay | Admitting: Urology

## 2019-02-18 ENCOUNTER — Other Ambulatory Visit: Payer: Self-pay | Admitting: Family Medicine

## 2019-02-18 DIAGNOSIS — D3000 Benign neoplasm of unspecified kidney: Secondary | ICD-10-CM

## 2019-02-22 NOTE — Telephone Encounter (Signed)
Called patient, requested refill for tramadol, which I prescribed for him before his most recent surgery. I saw via PDMP review that Dr. Windy Carina office, his neurosurgeon, has been writing oxycodone over the past month or 2 as well as diazepam.  I instructed him that I cannot write tramadol at the time that his surgeon is also managing his pain via oxycodone.  He was using the tramadol for his chronic knee pain.  I recommended that he come in and be seen to discuss alternative options for this.  He will give [place] a call.

## 2019-03-15 ENCOUNTER — Ambulatory Visit
Admission: RE | Admit: 2019-03-15 | Discharge: 2019-03-15 | Disposition: A | Discharge status: Home / Self Care | Payer: Medicaid Other | Source: Ambulatory Visit | Attending: Urology | Admitting: Urology

## 2019-03-15 ENCOUNTER — Other Ambulatory Visit: Payer: Self-pay

## 2019-03-15 DIAGNOSIS — D3000 Benign neoplasm of unspecified kidney: Secondary | ICD-10-CM

## 2019-03-15 MED ORDER — GADOBUTROL 1 MMOL/ML IV SOLN
10.0000 mL | Freq: Once | INTRAVENOUS | Status: AC | PRN
  Administered 2019-03-15: 10 mL via INTRAVENOUS

## 2019-03-31 ENCOUNTER — Telehealth: Payer: Self-pay | Admitting: Nurse Practitioner

## 2019-03-31 ENCOUNTER — Other Ambulatory Visit: Payer: Self-pay | Admitting: Student

## 2019-03-31 DIAGNOSIS — M5414 Radiculopathy, thoracic region: Secondary | ICD-10-CM

## 2019-03-31 DIAGNOSIS — M5441 Lumbago with sciatica, right side: Secondary | ICD-10-CM

## 2019-03-31 DIAGNOSIS — M5442 Lumbago with sciatica, left side: Secondary | ICD-10-CM

## 2019-03-31 NOTE — Telephone Encounter (Signed)
Phone call to patient to verify medication list and allergies for myelogram procedure. Pt aware he will need to hold Eliquis for 48hrs prior to myelogram appointment time (pending approval from Dr. Zettie Cooley). Pt verbalized understanding. Pre and post procedure instructions reviewed with pt. Faxed thinner hold request to Dr. Maudie Mercury awaiting reply and further instruction.

## 2019-04-04 ENCOUNTER — Telehealth: Payer: Self-pay | Admitting: *Deleted

## 2019-04-04 NOTE — Telephone Encounter (Signed)
[facility] calling to check to status of form allowing pt to stop eliquis before a spinal procedure.   The form is in PCPs box, procedure cant be scheduled until that form is received back. Christen Bame, CMA

## 2019-04-05 NOTE — Telephone Encounter (Signed)
Pt calling to check status. Julian Lucas, CMA  

## 2019-04-06 NOTE — Telephone Encounter (Signed)
I put this in the fax pile several days ago. I will resign the second and send from hospital.

## 2019-04-15 ENCOUNTER — Ambulatory Visit
Admission: RE | Admit: 2019-04-15 | Discharge: 2019-04-15 | Disposition: A | Discharge status: Home / Self Care | Payer: Medicaid Other | Source: Ambulatory Visit | Attending: Student | Admitting: Student

## 2019-04-15 ENCOUNTER — Other Ambulatory Visit: Payer: Self-pay

## 2019-04-15 DIAGNOSIS — M5414 Radiculopathy, thoracic region: Secondary | ICD-10-CM

## 2019-04-15 DIAGNOSIS — M5441 Lumbago with sciatica, right side: Secondary | ICD-10-CM

## 2019-04-15 DIAGNOSIS — M5442 Lumbago with sciatica, left side: Secondary | ICD-10-CM

## 2019-04-15 MED ORDER — APIXABAN 5 MG PO TABS: 5.0000 mg | ORAL_TABLET | Freq: Two times a day (BID) | ORAL | 0 refills | Status: DC

## 2019-04-15 MED ORDER — DIAZEPAM 5 MG PO TABS
10.0000 mg | ORAL_TABLET | Freq: Once | ORAL | Status: AC
  Administered 2019-04-15: 10 mg via ORAL

## 2019-04-15 MED ORDER — IOPAMIDOL (ISOVUE-M 300) INJECTION 61%
10.0000 mL | Freq: Once | INTRAMUSCULAR | Status: AC | PRN
  Administered 2019-04-15: 10 mL via INTRATHECAL

## 2019-04-15 MED ORDER — LOSARTAN POTASSIUM 25 MG PO TABS: 25.0000 mg | ORAL_TABLET | Freq: Every day | ORAL | 1 refills | Status: DC

## 2019-04-15 NOTE — Discharge Instructions (Signed)
Myelogram Discharge Instructions  1. Go home and rest quietly for the next 24 hours.  It is important to lie flat for the next 24 hours.  Get up only to go to the restroom.  You may lie in the bed or on a couch on your back, your stomach, your left side or your right side.  You may have one pillow under your head.  You may have pillows between your knees while you are on your side or under your knees while you are on your back.  2. DO NOT drive today.  Recline the seat as far back as it will go, while still wearing your seat belt, on the way home.  3. You may get up to go to the bathroom as needed.  You may sit up for 10 minutes to eat.  You may resume your normal diet and medications unless otherwise indicated.  Drink lots of extra fluids today and tomorrow.  4. The incidence of headache, nausea, or vomiting is about 5% (one in 20 patients).  If you develop a headache, lie flat and drink plenty of fluids until the headache goes away.  Caffeinated beverages may be helpful.  If you develop severe nausea and vomiting or a headache that does not go away with flat bed rest, call [phone].  5. You may resume normal activities after your 24 hours of bed rest is over; however, do not exert yourself strongly or do any heavy lifting tomorrow. If when you get up you have a headache when standing, go back to bed and force fluids for another 24 hours.  6. Call your physician for a follow-up appointment.  The results of your myelogram will be sent directly to your physician by the following day.  7. If you have any questions or if complications develop after you arrive home, please call [phone].  Discharge instructions have been explained to the patient.  The patient, or the person responsible for the patient, fully understands these instructions.  YOU MAY RESTART YOUR ELIQUIS THIS AFTERNOON 04/15/2019 YOU MAY RESTART YOUR TRAMADOL SEROQUEL AND CYMBALTA TOMORROW 04/16/2019 AT 09:30AM.

## 2019-04-15 NOTE — Discharge Instr - Other Orders (Addendum)
1045 - Pt back to nursing station c/o dizziness and nausea. Pt believes this is due to taking Valium on an empty stomach. Pt was given crackers and drink. VS stable. Will continue to monitor in nurses station.  1057- pt reports he is feeling much better. Denies dizziness and nausea at this time

## 2019-04-15 NOTE — Progress Notes (Signed)
Pt reports he has been off of his Tramadol, Seroquel, Cymbalta, and Eliquis for 48 hours.

## 2019-04-26 ENCOUNTER — Ambulatory Visit (INDEPENDENT_AMBULATORY_CARE_PROVIDER_SITE_OTHER): Payer: Medicaid Other | Admitting: Psychiatry

## 2019-04-26 ENCOUNTER — Other Ambulatory Visit: Payer: Self-pay

## 2019-04-26 ENCOUNTER — Encounter (HOSPITAL_COMMUNITY): Payer: Self-pay | Admitting: Psychiatry

## 2019-04-26 DIAGNOSIS — F0630 Mood disorder due to known physiological condition, unspecified: Secondary | ICD-10-CM

## 2019-04-26 DIAGNOSIS — F331 Major depressive disorder, recurrent, moderate: Secondary | ICD-10-CM

## 2019-04-26 MED ORDER — DULOXETINE HCL 60 MG PO CPEP: 60.0000 mg | ORAL_CAPSULE | ORAL | 0 refills | Status: DC

## 2019-04-26 MED ORDER — QUETIAPINE FUMARATE 25 MG PO TABS: ORAL_TABLET | ORAL | 1 refills | Status: DC
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
## 2019-04-28 ENCOUNTER — Ambulatory Visit (HOSPITAL_COMMUNITY): Payer: Medicaid Other | Admitting: Psychiatry

## 2019-05-23 ENCOUNTER — Other Ambulatory Visit: Payer: Self-pay

## 2019-05-23 ENCOUNTER — Ambulatory Visit: Payer: Medicaid Other | Admitting: Family Medicine

## 2019-06-03 ENCOUNTER — Ambulatory Visit (INDEPENDENT_AMBULATORY_CARE_PROVIDER_SITE_OTHER): Payer: Medicaid Other | Admitting: Family Medicine

## 2019-06-03 ENCOUNTER — Other Ambulatory Visit: Payer: Self-pay

## 2019-06-03 VITALS — BP 137/93 | Ht 74.0 in

## 2019-06-03 DIAGNOSIS — M25571 Pain in right ankle and joints of right foot: Secondary | ICD-10-CM | POA: Diagnosis present

## 2019-06-03 NOTE — Progress Notes (Signed)
Julian Lucas [address] [place], [place] [address] Phone: [phone] Fax: [phone]   Patient Name: Julian Lucas Date of Birth: 1976-06-23 Medical Record Number: [id number] Gender: male Date of Encounter: 06/03/2019  CC: Chronic right ankle pain and numbness  HPI: Julian Lucas is a 43 year old gentleman presenting with chronic right ankle pain.  We last saw him for this post 1 year ago.  At that time we ordered an MRI of his ankle and referred him to orthopedics.  They created a brace that he has been wearing, but not as frequent as he should be.  Mention the chronic pain to his neurosurgeon, and had testing that revealed no distal nerve pathology, so recommended he follow back up with [place].  Rates the pain at 8/10.  He is having chronic back and left leg pain, utilizing a cane in the left hand.  He describes the numbness as starting in his ankle joint and spreading distally throughout his whole foot.  He denies any new trauma, swelling, erythema, or skin changes.  Past Medical History:  Diagnosis Date  . Anxiety   . Bipolar disorder ([place])   . 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 Chronic pain of multiple joints   . DEGENERATIVE JOINT DISEASE, LEFT KNEE 09/09/2010   Qualifier: Diagnosis of  By: Oneida Alar MD, KARL    . Depression   . Family history of adverse reaction to anesthesia    mom takes a long time to wake up  . GERD (gastroesophageal reflux disease)   . HERPES GENITALIS 11/21/2009   Qualifier: Diagnosis of  By: Jeannine Kitten MD, Rodman Key    . Hypertension   . PULMONARY EMBOLISM, HX OF 11/21/2009   Qualifier: Diagnosis of  By: Tye Savoy MD, Tommi Rumps    . UNEQUAL LEG LENGTH 08/09/2010   Qualifier: Diagnosis of  By: Ernestina Patches MD, Remo Lipps      Current Outpatient Medications on File Prior to Visit  Medication Sig Dispense Refill  . apixaban (ELIQUIS) 5 MG TABS tablet Take 1 tablet (5 mg total) by mouth 2 (two) times daily. 180 tablet 0  .  cyclobenzaprine (FLEXERIL) 10 MG tablet Take 10 mg by mouth 3 (three) times daily as needed for muscle spasms.    . DULoxetine (CYMBALTA) 60 MG capsule Take 1 capsule (60 mg total) by mouth every morning. 90 capsule 0  . Eliquis DVT/PE Starter Pack (ELIQUIS STARTER PACK) 5 MG TABS Take as directed on package: start with two-5mg  tablets twice daily for 7 days. On day 8, switch to one-5mg  tablet twice daily. 1 each 0  . gabapentin (NEURONTIN) 300 MG capsule TAKE 1 CAPSULE BY MOUTH THREE TIMES A DAY (Patient taking differently: Take 300 mg by mouth 3 (three) times daily. ) 270 capsule 1  . losartan (COZAAR) 25 MG tablet Take 1 tablet (25 mg total) by mouth daily. 90 tablet 1  . Oxycodone HCl 10 MG TABS Take 1 tablet (10 mg total) by mouth every 6 (six) hours. 12 tablet 0  . QUEtiapine (SEROQUEL) 25 MG tablet Take 2-3 tab at bed time 90 tablet 1  . traMADol (ULTRAM) 50 MG tablet Take 1 tablet (50 mg total) by mouth every 12 (twelve) hours as needed. 60 tablet 0   No current facility-administered medications on file prior to visit.     Past Surgical History:  Procedure Laterality Date  . ANTERIOR CERVICAL DECOMP/DISCECTOMY FUSION N/A 11/23/2017   Procedure: ANTERIOR CERVICAL DECOMPRESSION AND FUSION CERVICAL SIX-SEVEN;  Surgeon: Kary Kos, MD;  Location: [facility];  Service: Neurosurgery;  Laterality: N/A;  anterior  . KNEE SURGERY    . LUMBAR LAMINECTOMY/DECOMPRESSION MICRODISCECTOMY Left 11/10/2018   Procedure: Laminectomy and Foraminotomy - Lumbar Three-Lumbar Four - Lumbar Four-Lumbar Five - Lumbar Five-Sacral One, laminectomy and microdiskectomy Thoracic Ten-Thoracic Eleven - left;  Surgeon: Kary Kos, MD;  Location: [facility];  Service: Neurosurgery;  Laterality: Left;  Laminectomy and Foraminotomy - Lumbar Three-Lumbar Four - Lumbar Four-Lumbar Five - Lumbar Five-Sacral One, laminectomy and m  . NECK SURGERY    . TONSILECTOMY, ADENOIDECTOMY, BILATERAL MYRINGOTOMY AND TUBES      No Known Allergies   Social History   Socioeconomic History  . Marital status: Significant Other    Spouse name: Not on file  . Number of children: 2  . Years of education: Not on file  . Highest education level: High school graduate  Occupational History    Comment: unemployed  Social Needs  . Financial resource strain: Somewhat hard  . Food insecurity    Worry: Sometimes true    Inability: Sometimes true  . Transportation needs    Medical: No    Non-medical: No  Tobacco Use  . Smoking status: Current Every Day Smoker    Types: Cigars  . Smokeless tobacco: Never Used  . Tobacco comment: 1-2 daily "Black and Milds"  Substance and Sexual Activity  . Alcohol use: Not Currently  . Drug use: Yes    Types: Marijuana    Comment: 11/02/18  . Sexual activity: Not Currently  Lifestyle  . Physical activity    Days per week: 0 days    Minutes per session: 0 min  . Stress: Rather much  Relationships  . Social Herbalist on phone: Twice a week    Gets together: Never    Attends religious service: Never    Active member of club or organization: No    Attends meetings of clubs or organizations: Never    Relationship status: Not on file  . Intimate partner violence    Fear of current or ex partner: No    Emotionally abused: No    Physically abused: No    Forced sexual activity: No  Other Topics Concern  . Not on file  Social History Narrative   ** Merged History Encounter **        Family History  Problem Relation Age of Onset  . Lung cancer Father   . Alcohol abuse Father   . Depression Mother   . Hypertension Mother   . Heart disease Mother     BP (!) 137/93   Ht 6\' 2"  (1.88 m)   BMI 43.76 kg/m   ROS:  See HPI CONST: no F/C, no malaise, no fatigue MSK: See above NEURO: no numbness/tingling, no weakness SKIN: no rash, no lesions HEME: no bleeding, no bruising, no erythema  Objective: GEN: Alert and oriented, NAD Pulm: Breathing unlabored PSY: normal mood,  congruent affect  Right ankle No gross deformity, swelling, ecchymoses Decreased AROM ankle joint, not much improvement passively TTP anterior and lateral ankle Decreased strength in PF, DF, eversion, inversion Negative ant drawer and talar tilt.   Negative syndesmotic compression. Thompsons test negative. Positive Tinel sign NV intact distally.   Assessment and Plan:  1.  Chronic right ankle pain  Given that patient has seen an orthopedist in the past and is having similar symptoms that have failed conservative management in the setting of MRI findings  from 2019, we have referred him back to orthopedic surgery to see if there is surgical intervention.  He is to follow up with [place] as needed.   Lanier Clam, DO, ATC Sports Medicine Fellow

## 2019-06-03 NOTE — Patient Instructions (Signed)
We have scheduled you to see Dr. Lucia Gaskins again for your right ankle [place]  Appt: Wednesday 06/08/2019 @ 3:45 pm. Please bring your insurance card.

## 2019-06-06 NOTE — Progress Notes (Signed)
[facility]: Attending Note: I have reviewed the chart, discussed wit the Sports Medicine Fellow. I agree with assessment and treatment plan as detailed in the Fellow's note.  

## 2019-06-07 ENCOUNTER — Other Ambulatory Visit: Payer: Self-pay

## 2019-06-07 ENCOUNTER — Encounter: Payer: Self-pay | Admitting: Diagnostic Neuroimaging

## 2019-06-07 ENCOUNTER — Ambulatory Visit: Payer: Medicaid Other | Admitting: Diagnostic Neuroimaging

## 2019-06-07 VITALS — BP 141/98 | HR 72 | Temp 99.2°F | Ht 74.0 in | Wt 342.2 lb

## 2019-06-07 DIAGNOSIS — M79604 Pain in right leg: Secondary | ICD-10-CM

## 2019-06-07 DIAGNOSIS — G959 Disease of spinal cord, unspecified: Secondary | ICD-10-CM

## 2019-06-07 DIAGNOSIS — M48062 Spinal stenosis, lumbar region with neurogenic claudication: Secondary | ICD-10-CM

## 2019-06-07 NOTE — Progress Notes (Signed)
[facility]  PATIENT: Julian Lucas DOB: [birth year]  REFERRING CLINICIAN: Saintclair Halsted / Mey[place] HISTORY FROM: patient and wife  REASON FOR VISIT: new consult    HISTORICAL  CHIEF COMPLAINT:  Chief Complaint  Patient presents with  . Pain    rm 7 New Pt, wife- Leucrishia, "having pain in my right leg/foot/groin > 1 year, feels like it's on fire"    HISTORY OF PRESENT ILLNESS:   43 year old male here for evaluation of right leg pain.  Patient has long and complex degenerative spinal history starting in 2011.  Patient was lifting weights and at that time had some type of neck injury resulting in weakness from his neck down.  He underwent his first cervical spine surgery at that time.  Patient then developed other issues of upper extremity numbness and weakness, gait difficulty, bowel and bladder dysfunction, erectile dysfunction, low back pain and lower extremity numbness and weakness.  He underwent another cervical decompression and fusion in April 2019 and low back surgery in March 2020.    Unfortunately patient continues to have significant pain in his right lower abdominal region, groin, right leg and thigh.  He has some numbness in his perineal and genital region.  He is having erectile dysfunction.  Having frequent urination.  Also having right ankle pain and discomfort.  Also having left knee pain.  Patient has been to neurosurgery clinic and had repeated spinal imaging of cervical, thoracic and lumbar spine including myelography.  Patient referred here for further diagnostic consideration and localization of symptoms.    REVIEW OF SYSTEMS: Full 14 system review of systems performed and negative with exception of: As per HPI.  Numbness weakness depression anxiety insomnia joint pain.  ALLERGIES: No Known Allergies  HOME MEDICATIONS: Outpatient Medications Prior to Visit  Medication Sig Dispense Refill  . apixaban (ELIQUIS) 5 MG TABS tablet Take 1 tablet (5  mg total) by mouth 2 (two) times daily. 180 tablet 0  . cyclobenzaprine (FLEXERIL) 10 MG tablet Take 10 mg by mouth 3 (three) times daily as needed for muscle spasms.    . DULoxetine (CYMBALTA) 60 MG capsule Take 1 capsule (60 mg total) by mouth every morning. 90 capsule 0  . gabapentin (NEURONTIN) 300 MG capsule TAKE 1 CAPSULE BY MOUTH THREE TIMES A DAY (Patient taking differently: Take 300 mg by mouth 3 (three) times daily. ) 270 capsule 1  . losartan (COZAAR) 25 MG tablet Take 1 tablet (25 mg total) by mouth daily. 90 tablet 1  . Oxycodone HCl 10 MG TABS Take 1 tablet (10 mg total) by mouth every 6 (six) hours. 12 tablet 0  . QUEtiapine (SEROQUEL) 25 MG tablet Take 2-3 tab at bed time 90 tablet 1  . Eliquis DVT/PE Starter Pack (ELIQUIS STARTER PACK) 5 MG TABS Take as directed on package: start with two-5mg  tablets twice daily for 7 days. On day 8, switch to one-5mg  tablet twice daily. 1 each 0  . traMADol (ULTRAM) 50 MG tablet Take 1 tablet (50 mg total) by mouth every 12 (twelve) hours as needed. 60 tablet 0   No facility-administered medications prior to visit.     PAST MEDICAL HISTORY: Past Medical History:  Diagnosis Date  . Anxiety   . Bipolar disorder ([place])   . 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 Chronic pain of multiple joints   . DEGENERATIVE JOINT DISEASE, LEFT KNEE 09/09/2010   Qualifier: Diagnosis of  By: Oneida Alar MD, KARL    .  Depression   . Family history of adverse reaction to anesthesia    mom takes a long time to wake up  . GERD (gastroesophageal reflux disease)   . HERPES GENITALIS 11/21/2009   Qualifier: Diagnosis of  By: Jeannine Kitten MD, Rodman Key    . Hypertension   . PULMONARY EMBOLISM, HX OF 11/21/2009   Qualifier: Diagnosis of  By: Tye Savoy MD, Tommi Rumps    . UNEQUAL LEG LENGTH 08/09/2010   Qualifier: Diagnosis of  By: Ernestina Patches MD, Remo Lipps      PAST SURGICAL HISTORY: Past Surgical History:  Procedure Laterality Date  . ANTERIOR CERVICAL  DECOMP/DISCECTOMY FUSION N/A 11/23/2017   Procedure: ANTERIOR CERVICAL DECOMPRESSION AND FUSION CERVICAL SIX-SEVEN;  Surgeon: Kary Kos, MD;  Location: [place];  Service: Neurosurgery;  Laterality: N/A;  anterior  . KNEE SURGERY    . LUMBAR LAMINECTOMY/DECOMPRESSION MICRODISCECTOMY Left 11/10/2018   Procedure: Laminectomy and Foraminotomy - Lumbar Three-Lumbar Four - Lumbar Four-Lumbar Five - Lumbar Five-Sacral One, laminectomy and microdiskectomy Thoracic Ten-Thoracic Eleven - left;  Surgeon: Kary Kos, MD;  Location: [place];  Service: Neurosurgery;  Laterality: Left;  Laminectomy and Foraminotomy - Lumbar Three-Lumbar Four - Lumbar Four-Lumbar Five - Lumbar Five-Sacral One, laminectomy and m  . NECK SURGERY  2011  . TONSILECTOMY, ADENOIDECTOMY, BILATERAL MYRINGOTOMY AND TUBES      FAMILY HISTORY: Family History  Problem Relation Age of Onset  . Lung cancer Father   . Alcohol abuse Father   . Depression Mother   . Hypertension Mother   . Heart disease Mother     SOCIAL HISTORY: Social History   Socioeconomic History  . Marital status: Significant Other    Spouse name: Not on file  . Number of children: 2  . Years of education: Not on file  . Highest education level: High school graduate  Occupational History    Comment: unemployed  Social Needs  . Financial resource strain: Somewhat hard  . Food insecurity    Worry: Sometimes true    Inability: Sometimes true  . Transportation needs    Medical: No    Non-medical: No  Tobacco Use  . Smoking status: Current Every Day Smoker    Types: Cigars  . Smokeless tobacco: Never Used  . Tobacco comment: 1-2 daily "Black and Milds"  Substance and Sexual Activity  . Alcohol use: Not Currently  . Drug use: Yes    Types: Marijuana    Comment: 06/07/19  . Sexual activity: Not Currently  Lifestyle  . Physical activity    Days per week: 0 days    Minutes per session: 0 min  . Stress: Rather much  Relationships  . Social Product manager on phone: Twice a week    Gets together: Never    Attends religious service: Never    Active member of club or organization: No    Attends meetings of clubs or organizations: Never    Relationship status: Not on file  . Intimate partner violence    Fear of current or ex partner: No    Emotionally abused: No    Physically abused: No    Forced sexual activity: No  Other Topics Concern  . Not on file  Social History Narrative   Lives with wife    caffeine- none     PHYSICAL EXAM  GENERAL EXAM/CONSTITUTIONAL: Vitals:  Vitals:   06/07/19 1245  BP: (!) 141/98  Pulse: 72  Temp: 99.2 F (37.3 C)  Weight: (!) 342 lb 3.2  oz (155.2 kg)  Height: 6\' 2"  (1.88 m)     Body mass index is 43.94 kg/m. Wt Readings from Last 3 Encounters:  06/07/19 (!) 342 lb 3.2 oz (155.2 kg)  01/20/19 (!) 340 lb 12.8 oz (154.6 kg)  12/30/18 (!) 343 lb 12.8 oz (155.9 kg)     Patient is in no distress; well developed, nourished and groomed; neck is supple  CARDIOVASCULAR:  Examination of carotid arteries is normal; no carotid bruits  Regular rate and rhythm, no murmurs  Examination of peripheral vascular system by observation and palpation is normal  EYES:  Ophthalmoscopic exam of optic discs and posterior segments is normal; no papilledema or hemorrhages  No exam data present  MUSCULOSKELETAL:  Gait, strength, tone, movements noted in Neurologic exam below  NEUROLOGIC: MENTAL STATUS:  No flowsheet data found.  awake, alert, oriented to person, place and time  recent and remote memory intact  normal attention and concentration  language fluent, comprehension intact, naming intact  fund of knowledge appropriate  CRANIAL NERVE:   2nd - no papilledema on fundoscopic exam  2nd, 3rd, 4th, 6th - pupils equal and reactive to light, visual fields full to confrontation, extraocular muscles intact, no nystagmus  5th - facial sensation symmetric  7th - facial strength  symmetric  8th - hearing intact  9th - palate elevates symmetrically, uvula midline  11th - shoulder shrug symmetric  12th - tongue protrusion midline  MOTOR:   normal bulk and tone  BUE DELTOID 4, GRIP 4+  BLE --> RIGHT HF 2-3. RIGHT DF 2-3; LLE 4-5  SENSORY:   normal and symmetric to light touch; DECR IN RIGHT LEG  COORDINATION:   finger-nose-finger, fine finger movements normal  REFLEXES:   deep tendon reflexes TRACE and symmetric; ABSENT IN BLE  GAIT/STATION:   ANTALGIC GAIT     DIAGNOSTIC DATA (LABS, IMAGING, TESTING) - I reviewed patient records, labs, notes, testing and imaging myself where available.  Lab Results  Component Value Date   WBC 6.6 12/28/2018   HGB 11.8 (L) 12/28/2018   HCT 37.2 (L) 12/28/2018   MCV 95.6 12/28/2018   PLT 216 12/28/2018      Component Value Date/Time   NA 142 12/28/2018 1644   NA 143 10/12/2018 1415   K 3.9 12/28/2018 1644   CL 111 12/28/2018 1644   CO2 22 12/28/2018 1644   GLUCOSE 95 12/28/2018 1644   BUN 13 12/28/2018 1644   BUN 15 10/12/2018 1415   CREATININE 0.64 12/28/2018 1644   CREATININE 0.69 07/27/2013 1029   CALCIUM 8.9 12/28/2018 1644   PROT 6.5 12/28/2018 1644   PROT 6.9 10/12/2018 1415   ALBUMIN 3.2 (L) 12/28/2018 1644   ALBUMIN 4.0 10/12/2018 1415   AST 15 12/28/2018 1644   ALT 10 12/28/2018 1644   ALKPHOS 77 12/28/2018 1644   BILITOT 0.9 12/28/2018 1644   BILITOT 0.7 10/12/2018 1415   GFRNONAA >60 12/28/2018 1644   GFRAA >60 12/28/2018 1644   Lab Results  Component Value Date   CHOL 183 11/21/2009   HDL 47 11/21/2009   LDLCALC 117 (H) 11/21/2009   LDLDIRECT 120 (H) 10/15/2012   TRIG 97 11/21/2009   CHOLHDL 3.9 Ratio 11/21/2009   Lab Results  Component Value Date   HGBA1C  11/01/2009    5.1 (NOTE) The ADA recommends the following therapeutic goal for glycemic control related to Hgb A1c measurement: Goal of therapy: <6.5 Hgb A1c  Reference: American Diabetes Association: Clinical  Practice Recommendations  2010, Diabetes Care, 2010, 33: (Suppl  1).   Lab Results  Component Value Date   [id number] 07/27/2017   Lab Results  Component Value Date   TSH 0.838 10/12/2018     04/15/19 CT thoracic / lumbar myelogram Thoracic spine: 1. Postsurgical changes on the left at T10-T11. Unchanged mild degenerative disc disease without stenosis or impingement. 2. Unchanged moderate left neuroforaminal stenosis at T1-T2 due to foraminal endplate spurring.  3. Additional mild multilevel thoracic spondylosis as described above. Lumbar spine: 1. Prior posterior decompression at L4-L5 with unchanged moderate bilateral neuroforaminal stenosis. 2. Prior posterior decompression at L5-S1 with unchanged moderate to severe right and severe left neuroforaminal stenosis. 3. Unchanged moderate spinal canal and right neuroforaminal stenosis at L3-L4. 4. Unchanged left extraforaminal disc osteophyte complex at L2-L3 potentially affecting the exiting left L2 nerve root.  10/12/18 MRI cervical / thoracic / lumbar  1. Stable degenerative and postoperative changes in the cervical spine aside from new mild degenerative endplate edema at 075-GRM and C7-T1. 2. Moderate to severe spinal stenosis at C2-3 and moderate spinal stenosis at C3-4. 3. Moderate neural foraminal stenosis on the right at C6-7 and on the left at C7-T1.  4. Unchanged cervical spinal cord myelomalacia. 5. Thoracic disc degeneration without significant interval change, most notable at T10-11 where there is mild spinal stenosis from a left paracentral disc protrusion. 6. Unchanged lumbar disc degeneration and posterior element hypertrophy resulting in moderate to severe spinal stenosis at L3-4 and L4-5. 7. Severe left and moderate right neural foraminal stenosis at L5-S1 and moderate biforaminal stenosis at L4-5.  04/12/19 EMG/NCS  - normal study of right lower extremity   ASSESSMENT AND PLAN  43 y.o. year old male here with  right lower abdominal and right lower extremity pain, numbness, weakness, erectile dysfunction, frequent urination.  These symptoms primarily localize to right lumbosacaral spinal roots (L2-S1) and associated spinal stenosis and foraminal stenosis as noted in above imaging studies.  Patient's right lower abdominal pain may be referred pain symptoms from the lumbar localization.  No evidence of lower right thoracic radiculopathy based on imaging.    In addition patient has significant degenerative changes of the cervical spine including ongoing spinal stenosis at C2-3 and C3-4 with prior myelomalacia and spinal cord injury at nearby levels.  Dx:  1. Right leg pain   2. Spinal stenosis of lumbar region with neurogenic claudication   3. Cervical myelopathy (HCC)     PLAN:  - follow-up with spine neurosurgery clinic regarding lumbar spinal stenosis and foraminal stenosis; consider surgical vs pain mgmt options  Return return to Neurosurgery clinic.    Penni Bombard, MD 0000000, [id number] PM Certified in Neurology, Neurophysiology and Neuroimaging  [facility] [address] [place], [place] [address] [phone]

## 2019-06-15 ENCOUNTER — Other Ambulatory Visit: Payer: Self-pay | Admitting: Urology

## 2019-06-15 ENCOUNTER — Other Ambulatory Visit: Payer: Self-pay | Admitting: Orthopaedic Surgery

## 2019-06-15 DIAGNOSIS — G8929 Other chronic pain: Secondary | ICD-10-CM

## 2019-06-15 DIAGNOSIS — D3000 Benign neoplasm of unspecified kidney: Secondary | ICD-10-CM

## 2019-06-27 ENCOUNTER — Other Ambulatory Visit: Payer: Self-pay

## 2019-06-27 ENCOUNTER — Ambulatory Visit (INDEPENDENT_AMBULATORY_CARE_PROVIDER_SITE_OTHER): Payer: Medicaid Other | Admitting: Psychiatry

## 2019-06-27 ENCOUNTER — Encounter (HOSPITAL_COMMUNITY): Payer: Self-pay | Admitting: Psychiatry

## 2019-06-27 DIAGNOSIS — F0630 Mood disorder due to known physiological condition, unspecified: Secondary | ICD-10-CM

## 2019-06-27 DIAGNOSIS — F331 Major depressive disorder, recurrent, moderate: Secondary | ICD-10-CM | POA: Diagnosis not present

## 2019-06-27 MED ORDER — DULOXETINE HCL 60 MG PO CPEP: 60.0000 mg | ORAL_CAPSULE | ORAL | 0 refills | Status: DC

## 2019-06-27 MED ORDER — QUETIAPINE FUMARATE 100 MG PO TABS: 100.0000 mg | ORAL_TABLET | Freq: Every day | ORAL | 0 refills | Status: DC
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
## 2019-06-28 ENCOUNTER — Other Ambulatory Visit: Payer: Self-pay

## 2019-06-29 MED ORDER — GABAPENTIN 300 MG PO CAPS: 300.0000 mg | ORAL_CAPSULE | Freq: Three times a day (TID) | ORAL | 0 refills | Status: DC

## 2019-07-03 ENCOUNTER — Ambulatory Visit
Admission: RE | Admit: 2019-07-03 | Discharge: 2019-07-03 | Disposition: A | Discharge status: Home / Self Care | Payer: Medicaid Other | Source: Ambulatory Visit | Attending: Orthopaedic Surgery | Admitting: Orthopaedic Surgery

## 2019-07-03 ENCOUNTER — Other Ambulatory Visit: Payer: Self-pay

## 2019-07-03 DIAGNOSIS — G8929 Other chronic pain: Secondary | ICD-10-CM

## 2019-07-04 ENCOUNTER — Telehealth: Payer: Self-pay | Admitting: Family Medicine

## 2019-07-04 NOTE — Telephone Encounter (Signed)
Spoke to patient to ensure that he was not coming in for ankle follow up as he would need to follow up with ortho surgery as per previous plans at last appt with Dr. Nori Riis. Patient reports that he is coming in for neck pain.   MRI rt ankle noted. Patient will need to f/u with ortho.   Wilber Oliphant, M.D.  9:40 AM 07/04/2019

## 2019-07-05 ENCOUNTER — Encounter: Payer: Self-pay | Admitting: Family Medicine

## 2019-07-05 ENCOUNTER — Ambulatory Visit (INDEPENDENT_AMBULATORY_CARE_PROVIDER_SITE_OTHER): Payer: Medicaid Other | Admitting: Family Medicine

## 2019-07-05 ENCOUNTER — Other Ambulatory Visit: Payer: Self-pay

## 2019-07-05 VITALS — BP 122/94 | HR 70 | Wt 331.6 lb

## 2019-07-05 DIAGNOSIS — L7211 Pilar cyst: Secondary | ICD-10-CM

## 2019-07-05 NOTE — Patient Instructions (Addendum)
Dear Julian Lucas,   It was good to see you! Thank you for taking your time to come in to be seen. Today, we discussed the following:   Cyst  The knots on the back of your head are sebaceous cysts. The are completely harmless, just annoying. I  Have printed some information that is attached to this packet  Please think about whether or not you'd like them removed and we'd be happy to send you a referral to a dermatologist.    Be well,   Zettie Cooley, M.D   [place] [phone]  *Sign up for MyChart for instant access to your health profile, labs, orders, upcoming appointments or to contact your provider with questions*  ===================================================================================

## 2019-07-09 ENCOUNTER — Encounter: Payer: Self-pay | Admitting: Family Medicine

## 2019-07-09 DIAGNOSIS — L7211 Pilar cyst: Secondary | ICD-10-CM | POA: Insufficient documentation

## 2019-07-09 NOTE — Assessment & Plan Note (Addendum)
Ultrasound used to evaluate lesions. Hypoechoic with scatter, small hyperechoic flecks of material. Spoke to patient about possible diagnoses. Very low risk of malignancy and is likely pilar vs sebaceous cyst given texture, appearance and pt presentation. Weighed risk and benefit of removal at this time. Patient would like to wait until he can get a hold of the other medical problems that he is struggling with prior to removal. The nodules are seated deeply and will require referral to gen surg vs. Dermatology.  - monitor  - plan discussed with Dr. Nori Riis who was VP for the day.

## 2019-07-09 NOTE — Progress Notes (Signed)
    Subjective  CC: Knot On Neck (back area)  [id number] Julian Lucas is a 43 y.o. male with PMHx significant for obesity, depression, bipolar, who presents today with the following problems: Knot of neck Patient reports two knots on his midline posterior neck that has been present for several months, possbly > 1 year. Patient reports that they may be getting bigger. Never bothered him until recently. He thinks the area is more aggravated due to more manipulation as he is more aware of the area. He cannot sleep on his back to the knots. He does report pain with palpation. Sometimes, they feel like they are throbbing. Patient denies any drainage, erythema, pruritis.  Has never previously had. Has one similar on his chest, but feels less hard. Has had boils in the past. No fevers, chills, headache, dizziness, n/v.   Pertinent P/F/SHx: No integumentary disease previously. Other pmhx as above.  ROS: Pertinent ROS included in HPI. Objective  Physical Exam:  BP (!) 122/94   Pulse 70   Wt (!) 331 lb 9.6 oz (150.4 kg)   SpO2 97%   BMI 42.57 kg/m  General: well appearing, obese, African American male walks with cane.  Head: [place]/AT. Two oval palpable nodules on nape of neck. Mobile. No hard calcifications appreciated. Soft without fluctuance or induration. No other rash of pore opening appreciated. No warm to touch.    Assessment & Plan    Problem List Items Addressed This Visit      Other   Pilar cysts - Primary    Ultrasound used to evaluate lesions. Hypoechoic with scatter, small hyperechoic flecks of material. Spoke to patient about possible diagnoses. Very low risk of malignancy and is likely pilar vs sebaceous cyst given texture, appearance and pt presentation. Weighed risk and benefit of removal at this time. Patient would like to wait until he can get a hold of the other medical problems that he is struggling with prior to removal. The nodules are seated deeply and will require referral to gen  surg vs. Dermatology.  - monitor  - plan discussed with Dr. Nori Riis who was VP for the day.          Wilber Oliphant, M.D.  2:34 PM 07/09/2019

## 2019-07-12 ENCOUNTER — Other Ambulatory Visit: Payer: Self-pay | Admitting: Family Medicine

## 2019-07-26 ENCOUNTER — Other Ambulatory Visit: Payer: Self-pay | Admitting: Neurosurgery

## 2019-07-27 ENCOUNTER — Telehealth: Payer: Self-pay | Admitting: Oncology

## 2019-07-27 NOTE — Telephone Encounter (Signed)
Called patient per providers request to convert 12/18 appointment to a virtual visit, patient is notified.

## 2019-08-12 ENCOUNTER — Inpatient Hospital Stay: Payer: Medicaid Other | Attending: Oncology | Admitting: Oncology

## 2019-08-12 DIAGNOSIS — Z7901 Long term (current) use of anticoagulants: Secondary | ICD-10-CM | POA: Insufficient documentation

## 2019-08-12 DIAGNOSIS — I8290 Acute embolism and thrombosis of unspecified vein: Secondary | ICD-10-CM | POA: Diagnosis not present

## 2019-08-12 DIAGNOSIS — Z79899 Other long term (current) drug therapy: Secondary | ICD-10-CM | POA: Insufficient documentation

## 2019-08-12 DIAGNOSIS — Z86718 Personal history of other venous thrombosis and embolism: Secondary | ICD-10-CM | POA: Insufficient documentation

## 2019-08-12 NOTE — Progress Notes (Signed)
Hematology and Oncology Follow Up for Telemedicine Visits  Julian Lucas [id number] 05-17-1976 43 y.o. 08/12/2019 2:13 PM Wilber Oliphant, MDTimberlake, Anastasia Pall, *   I connected with Julian Lucas on 08/12/19 at  3:00 PM EST by video enabled telemedicine visit and verified that I am speaking with the correct person using two identifiers.  This was changed to a video call because of technical difficulties   I discussed the limitations, risks, security and privacy concerns of performing an evaluation and management service by telemedicine and the availability of in-person appointments. I also discussed with the patient that there may be a patient responsible charge related to this service. The patient expressed understanding and agreed to proceed.  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  This was provoked after thoracic decompressive laminectomy.  He had a previous episode in 2011.   Prior Therapy: Warfarin for 6 months in 2011.  Current therapy: Eliquis 5 mg twice a day started in May 2020  Interim History: Julian Lucas reports no major changes in his health.  He continues to be on Eliquis without any recent thrombosis or bleeding episodes.  He denies any recent hospitalization or illnesses.  He denies any excessive bleeding episodes.  Denies any hematochezia, melena or epistaxis.  He is scheduled to have another surgery in the near future.     Medications: I have reviewed the patient's current medications.  Current Outpatient Medications  Medication Sig Dispense Refill  . cyclobenzaprine (FLEXERIL) 10 MG tablet Take 10 mg by mouth 3 (three) times daily as needed for muscle spasms.    . DULoxetine (CYMBALTA) 60 MG capsule Take 1 capsule (60 mg total) by mouth every morning. 90 capsule 0  .  ELIQUIS 5 MG TABS tablet TAKE 1 TABLET BY MOUTH TWICE A DAY 180 tablet 0  . gabapentin (NEURONTIN) 300 MG capsule Take 1 capsule (300 mg total) by mouth 3 (three) times daily. 90 capsule 0  . losartan (COZAAR) 25 MG tablet Take 1 tablet (25 mg total) by mouth daily. 90 tablet 1  . Oxycodone HCl 10 MG TABS Take 1 tablet (10 mg total) by mouth every 6 (six) hours. 12 tablet 0  . QUEtiapine (SEROQUEL) 100 MG tablet Take 1 tablet (100 mg total) by mouth at bedtime. 90 tablet 0   No current facility-administered medications for this visit.     Allergies: No Known Allergies  Past Medical History, Surgical history, Social history, and Family History updated without any changes.    Lab Results: Lab Results  Component Value Date   WBC 6.6 12/28/2018   HGB 11.8 (L) 12/28/2018   HCT 37.2 (L) 12/28/2018   MCV 95.6 12/28/2018   PLT 216 12/28/2018     Chemistry      Component Value Date/Time   NA 142 12/28/2018 1644   NA 143 10/12/2018 1415   K 3.9 12/28/2018 1644   CL 111 12/28/2018 1644   CO2 22 12/28/2018 1644   BUN 13 12/28/2018 1644   BUN 15 10/12/2018 1415   CREATININE 0.64 12/28/2018 1644   CREATININE 0.69 07/27/2013 1029      Component Value Date/Time   CALCIUM 8.9 12/28/2018 1644   ALKPHOS 77 12/28/2018 1644   AST 15 12/28/2018 1644   ALT 10 12/28/2018 1644   BILITOT  0.9 12/28/2018 1644   BILITOT 0.7 10/12/2018 1415       Impression and Plan:  43 year old man with:   1.    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  He had a similar episode in 2011.   Hypercoagulable work-up was obtained on Jan 20, 2019 showed no inherited or acquired thrombophilia.  Risks and benefits of continuing full dose anticoagulation for an extended period of time and possibly lifetime given his recurrent VTE.  Given his recurrent thrombosis and upcoming surgery I recommended continuing full dose anticoagulation and reevaluate this in 6  months.  2.  Follow-up: In 6 months for repeat follow-up.  I discussed the assessment and treatment plan with the patient. The patient was provided an opportunity to ask questions and all were answered. The patient agreed with the plan and demonstrated an understanding of the instructions.   The patient was advised to call back or seek an in-person evaluation if the symptoms worsen or if the condition fails to improve as anticipated.  I provided  minutes of face-to-face video visit time during this encounter, and > 50% was spent on reviewing his laboratory data, treatment options and discussing future plan of care.  Zola Button, MD 08/12/2019 2:13 PM

## 2019-08-13 ENCOUNTER — Telehealth: Payer: Self-pay | Admitting: Oncology

## 2019-08-13 NOTE — Telephone Encounter (Signed)
Scheduled appt per 12/18 los.  Spoke with pt and he is aware of his appt date and time.

## 2019-09-05 ENCOUNTER — Ambulatory Visit
Admission: RE | Admit: 2019-09-05 | Discharge: 2019-09-05 | Disposition: A | Discharge status: Home / Self Care | Payer: Medicaid Other | Source: Ambulatory Visit | Attending: Urology | Admitting: Urology

## 2019-09-05 ENCOUNTER — Other Ambulatory Visit: Payer: Self-pay

## 2019-09-05 DIAGNOSIS — D3000 Benign neoplasm of unspecified kidney: Secondary | ICD-10-CM

## 2019-09-05 MED ORDER — GADOBENATE DIMEGLUMINE 529 MG/ML IV SOLN
20.0000 mL | Freq: Once | INTRAVENOUS | Status: AC | PRN
  Administered 2019-09-05: 20 mL via INTRAVENOUS

## 2019-09-08 ENCOUNTER — Other Ambulatory Visit: Payer: Self-pay

## 2019-09-08 ENCOUNTER — Encounter (HOSPITAL_COMMUNITY)
Admission: RE | Admit: 2019-09-08 | Discharge: 2019-09-08 | Disposition: A | Discharge status: Home / Self Care | Payer: Medicaid Other | Source: Ambulatory Visit | Attending: Neurosurgery | Admitting: Neurosurgery

## 2019-09-08 ENCOUNTER — Encounter (HOSPITAL_COMMUNITY): Payer: Self-pay

## 2019-09-08 ENCOUNTER — Other Ambulatory Visit (HOSPITAL_COMMUNITY)
Admission: RE | Admit: 2019-09-08 | Discharge: 2019-09-08 | Disposition: A | Discharge status: Home / Self Care | Payer: Medicaid Other | Source: Ambulatory Visit | Attending: Neurosurgery | Admitting: Neurosurgery

## 2019-09-08 ENCOUNTER — Telehealth: Payer: Self-pay

## 2019-09-08 DIAGNOSIS — F419 Anxiety disorder, unspecified: Secondary | ICD-10-CM | POA: Diagnosis not present

## 2019-09-08 DIAGNOSIS — G4733 Obstructive sleep apnea (adult) (pediatric): Secondary | ICD-10-CM | POA: Insufficient documentation

## 2019-09-08 DIAGNOSIS — M1712 Unilateral primary osteoarthritis, left knee: Secondary | ICD-10-CM | POA: Diagnosis not present

## 2019-09-08 DIAGNOSIS — Z6841 Body Mass Index (BMI) 40.0 and over, adult: Secondary | ICD-10-CM | POA: Diagnosis not present

## 2019-09-08 DIAGNOSIS — F319 Bipolar disorder, unspecified: Secondary | ICD-10-CM | POA: Insufficient documentation

## 2019-09-08 DIAGNOSIS — Z01818 Encounter for other preprocedural examination: Secondary | ICD-10-CM | POA: Insufficient documentation

## 2019-09-08 DIAGNOSIS — Z86718 Personal history of other venous thrombosis and embolism: Secondary | ICD-10-CM | POA: Diagnosis not present

## 2019-09-08 DIAGNOSIS — Z86711 Personal history of pulmonary embolism: Secondary | ICD-10-CM | POA: Insufficient documentation

## 2019-09-08 DIAGNOSIS — Z7901 Long term (current) use of anticoagulants: Secondary | ICD-10-CM | POA: Diagnosis not present

## 2019-09-08 DIAGNOSIS — I739 Peripheral vascular disease, unspecified: Secondary | ICD-10-CM | POA: Diagnosis not present

## 2019-09-08 DIAGNOSIS — Z981 Arthrodesis status: Secondary | ICD-10-CM | POA: Diagnosis not present

## 2019-09-08 DIAGNOSIS — M48061 Spinal stenosis, lumbar region without neurogenic claudication: Secondary | ICD-10-CM | POA: Diagnosis not present

## 2019-09-08 DIAGNOSIS — Z87891 Personal history of nicotine dependence: Secondary | ICD-10-CM | POA: Insufficient documentation

## 2019-09-08 DIAGNOSIS — I10 Essential (primary) hypertension: Secondary | ICD-10-CM | POA: Insufficient documentation

## 2019-09-08 DIAGNOSIS — F1221 Cannabis dependence, in remission: Secondary | ICD-10-CM | POA: Diagnosis not present

## 2019-09-08 DIAGNOSIS — Z79899 Other long term (current) drug therapy: Secondary | ICD-10-CM | POA: Insufficient documentation

## 2019-09-08 HISTORY — DX: Congenital multiple renal cysts: Q61.02

## 2019-09-08 HISTORY — DX: Sleep apnea, unspecified: G47.30

## 2019-09-08 HISTORY — DX: Peripheral vascular disease, unspecified: I73.9

## 2019-09-08 HISTORY — DX: Pneumonia, unspecified organism: J18.9

## 2019-09-08 LAB — SURGICAL PCR SCREEN
MRSA, PCR: NEGATIVE
Staphylococcus aureus: NEGATIVE

## 2019-09-08 LAB — BASIC METABOLIC PANEL
Anion gap: 8 (ref 5–15)
BUN: 16 mg/dL (ref 6–20)
CO2: 23 mmol/L (ref 22–32)
Calcium: 9.1 mg/dL (ref 8.9–10.3)
Chloride: 109 mmol/L (ref 98–111)
Creatinine, Ser: 0.7 mg/dL (ref 0.61–1.24)
GFR calc Af Amer: >60 mL/min (ref >60)
GFR calc non Af Amer: >60 mL/min (ref >60)
Glucose, Bld: 98 mg/dL (ref 70–99)
Potassium: 4.1 mmol/L (ref 3.5–5.1)
Sodium: 140 mmol/L (ref 135–145)

## 2019-09-08 LAB — CBC
HCT: 48.5 % (ref 39.0–52.0)
Hemoglobin: 15.4 g/dL (ref 13.0–17.0)
MCH: 30.7 pg (ref 26.0–34.0)
MCHC: 31.8 g/dL (ref 30.0–36.0)
MCV: 96.8 fL (ref 80.0–100.0)
Platelets: 242 10*3/uL (ref 150–400)
RBC: 5.01 MIL/uL (ref 4.22–5.81)
RDW: 12.2 % (ref 11.5–15.5)
WBC: 6.4 10*3/uL (ref 4.0–10.5)
nRBC: 0 % (ref 0.0–0.2)

## 2019-09-08 NOTE — Pre-Procedure Instructions (Addendum)
Julian Lucas  09/08/2019     Your procedure is scheduled on Monday, September 12, 2019 at 7:30 AM.   Report to [facility] Entrance "A" Admitting Office at 5:30 AM.   Call this number if you have problems the morning of surgery: [phone]   Questions prior to day of surgery, please call [phone] between 8 & 4 PM.   Remember:  Do not eat or drink after midnight Sunday, 09/11/19.  Take these medicines the morning of surgery with A SIP OF WATER: Duloxetine (Cymbalta), Gabapentin (Neurontin), Oxycodone - if needed.  Stop Eliquis as instructed by surgeon or physician. Do not use Aspirin products (BC Powders, Goody's, etc), NSAIDS (Ibuprofen, Aleve, etc), Multivitamins, Herbal medications or Multivitamins prior to surgery.  Per [name], stop Eliquis 2 days prior to surgery. Last dose will be Friday, 09/09/19.  Do not smoke cigars or marijuana 24 hours prior to surgery.    Do not wear jewelry.  Do not wear lotions, powders, cologne or deodorant.  Men may shave face and neck.  Do not bring valuables to the hospital.  [facility] is not responsible for any belongings or valuables.  Contacts, dentures or bridgework may not be worn into surgery.  Leave your suitcase in the car.  After surgery it may be brought to your room.  For patients admitted to the hospital, discharge time will be determined by your treatment team.  Patients discharged the day of surgery will not be allowed to drive home.   [place] - Preparing for Surgery  Before surgery, you can play an important role.  Because skin is not sterile, your skin needs to be as free of germs as possible.  You can reduce the number of germs on you skin by washing with CHG (chlorahexidine gluconate) soap before surgery.  CHG is an antiseptic cleaner which kills germs and bonds with the skin to continue killing germs even after washing.  Oral Hygiene is also important in reducing the risk of infection.  Remember to brush  your teeth with your regular toothpaste the morning of surgery.  Please DO NOT use if you have an allergy to CHG or antibacterial soaps.  If your skin becomes reddened/irritated stop using the CHG and inform your nurse when you arrive at Short Stay.  Do not shave (including legs and underarms) for at least 48 hours prior to the first CHG shower.  You may shave your face.  Please follow these instructions carefully:   1.  Shower with CHG Soap the night before surgery and the morning of Surgery.  2.  If you choose to wash your hair, wash your hair first as usual with your normal shampoo.  3.  After you shampoo, rinse your hair and body thoroughly to remove the shampoo. 4.  Use CHG as you would any other liquid soap.  You can apply chg directly to the skin and wash gently with a      scrungie or washcloth.           5.  Apply the CHG Soap to your body ONLY FROM THE NECK DOWN.   Do not use on open wounds or open sores. Avoid contact with your eyes, ears, mouth and genitals (private parts).  Wash genitals (private parts) with your normal soap.  6.  Wash thoroughly, paying special attention to the area where your surgery will be performed.  7.  Thoroughly rinse your body with warm water from the neck down.  8.  DO NOT shower/wash with your normal soap after using and rinsing off the CHG Soap.  9.  Pat yourself dry with a clean towel.            10.  Wear clean pajamas.            11.  Place clean sheets on your bed the night of your first shower and do not sleep with pets.  Day of Surgery  Shower as above. Do not apply any lotions/deodorants the morning of surgery.   Please wear clean clothes to the hospital. Remember to brush your teeth with toothpaste.   Please read over the fact sheets that you were given.

## 2019-09-08 NOTE — Progress Notes (Signed)
Anesthesia Chart Review:  Case: [id number] Date/Time: 09/12/19 0715   Procedure: Laminectomy and Foraminotomy - right - L2-L3 - L3-L4 (Right Back)   Anesthesia type: General   Pre-op diagnosis: Stenosis   Location: [facility] OR ROOM 21 / [place] OR   Surgeons: Julian Kos, MD      DISCUSSION: Patient is a 44 year old male scheduled for the above procedure.  History includes smoking (tobacco, marijuana), HTN, PE (post-op 11/06/09, s/p warfarin x 6 months), DVT (post-op right axillary/brachial/radial vein DVT 12/24/18, treatment: Eliquis), OSA (does not use CPAP), right renal lesion, GERD, Bipolar disorder. S/p C4-6 fusion 10/31/09, C6-7 ACDF 11/23/17. S/p T10-11 laminectomy/transpedicular discectomy 11/10/18. BMI is consistent with morbid obesity.   Recent re-evaluation by hematologist Dr. Alen Lucas for follow-up due to history of post-operative 2011 PE and 12/2018 RUE DVT. By notes, hypercoagulable work-up on 01/20/19 showed no inherited or acquired thrombophilia. Given recurrent thrombosis consider anticoagulation for an extended period of time (possibly lifetime); however, with upcoming surgery, he recommend Julian Lucas continue full dose anticoagulation for now and re-evaluate in 6 months.    He was given permission to temporarily hold Eliquis by PCP for previous spinal injection. Reportedly, Dr. Alen Lucas advised holding Eliquis for 2 days prior to surgery (last dose scheduled for 09/09/19). I have notified Julian Lucas at Dr. Bayard Lucas will reach out to Dr. Hazeline Lucas office regarding potentially holding for 3 days and follow-up with patient, if indicated. He is for PT/INR on the day of surgery.  Pre-surgical COVID-19 test is scheduled for 09/08/19.    VS: BP (!) 147/92   Pulse 80   Temp 36.9 C (Oral)   Resp 18   Ht 6\' 2"  (1.88 m)   Wt (!) 161.7 kg   SpO2 95%   BMI 45.77 kg/m    PROVIDERS: Julian Oliphant, MD is PCP Julian Andreas, MD is psychiatrist Julian Button, MD is hematologist. Last evalaution  08/22/19. Julian Spearman, MD is neurologist. Last evaluation 06/07/19. Julian Rhodes, MD is urologist. Julian Lucas he was referred after CT showed right renal lesion. 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    LABS: Labs reviewed: Acceptable for surgery. (all labs ordered are listed, but only abnormal results are displayed)  Labs Reviewed  SURGICAL PCR SCREEN  CBC  BASIC METABOLIC PANEL    Spirometry 11/04/18: FVC 4.73 (94.9%) FEV1 3.95 (97.4%) FEV1/FVC 83.5 (102.2%) [id number] 419 (102.2%) Normal spirometry.   IMAGES: CT T/L spine with myelogram 04/15/19: IMPRESSION: Thoracic spine: 1. Postsurgical changes on the left at T10-T11. Unchanged mild degenerative disc disease without stenosis or impingement. 2. Unchanged moderate left neuroforaminal stenosis at T1-T2 due to foraminal endplate spurring. 3. Additional mild multilevel thoracic spondylosis as described above. Lumbar spine: 1. Prior posterior decompression at L4-L5 with unchanged moderate bilateral neuroforaminal stenosis. 2. Prior posterior decompression at L5-S1 with unchanged moderate to severe right and severe left neuroforaminal stenosis. 3. Unchanged moderate spinal canal and right neuroforaminal stenosis at L3-L4. 4. Unchanged left extraforaminal disc osteophyte complex at L2-L3 potentially affecting the exiting left L2 nerve root.   CTA Chest 12/28/18: IMPRESSION: 1. Examination is substantially limited by poor contrast bolus, main pulmonary artery = 180 HU. Within this limitation, there is no obvious central or lobar pulmonary embolism. The segmental to subsegmental vessels are inadequately evaluated to exclude pulmonary embolism. Consider technical repeat CT angiogram for V/Q examination to further evaluate if there is a high, persistent suspicion for pulmonary  embolism. 2. There is extensive stranding about the  dilated subclavian and axillary vessels of the right upper extremity and a filling defect extending into the right brachiocephalic artery in keeping with known right upper extremity DVT (series 9, image 30, series 12, image 65).    EKG: 09/08/19: NSR. Non-specific inferior T wave abnormality also present on 11/16/17 tracing.    CV:  BUE venous US 12/24/18: Summary: Right: Findings consistent with age indeterminate deep vein thrombosis involving the right brachial veins, right radial veins and right axillary vein. Left: No evidence of thrombosis in the subclavian.   Past Medical History:  Diagnosis Date  . Anxiety   . Bipolar disorder ([place])   . 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 Chronic pain of multiple joints   . DEGENERATIVE JOINT DISEASE, LEFT KNEE 09/09/2010   Qualifier: Diagnosis of  By: Julian Alar MD, Julian Lucas    . Depression   . Family history of adverse reaction to anesthesia    mom takes a long time to wake up  . GERD (gastroesophageal reflux disease)   . HERPES GENITALIS 11/21/2009   Qualifier: Diagnosis of  By: Julian Kitten MD, Julian Lucas    . Hypertension   . Multiple renal cysts   . Peripheral vascular disease ([place])   . Pneumonia   . PULMONARY EMBOLISM, HX OF 11/21/2009   Qualifier: Diagnosis of  By: Julian Savoy MD, Julian Lucas    . Sleep apnea    does not use a cpap  . UNEQUAL LEG LENGTH 08/09/2010   Qualifier: Diagnosis of  By: Julian Patches MD, Julian Lucas      Past Surgical History:  Procedure Laterality Date  . ANTERIOR CERVICAL DECOMP/DISCECTOMY FUSION N/A 11/23/2017   Procedure: ANTERIOR CERVICAL DECOMPRESSION AND FUSION CERVICAL SIX-SEVEN;  Surgeon: Julian Kos, MD;  Location: [place];  Service: Neurosurgery;  Laterality: N/A;  anterior  . KNEE SURGERY    . LUMBAR LAMINECTOMY/DECOMPRESSION MICRODISCECTOMY Left 11/10/2018   Procedure: Laminectomy and Foraminotomy - Lumbar Three-Lumbar Four - Lumbar Four-Lumbar Five - Lumbar Five-Sacral One, laminectomy and  microdiskectomy Thoracic Ten-Thoracic Eleven - left;  Surgeon: Julian Kos, MD;  Location: [place];  Service: Neurosurgery;  Laterality: Left;  Laminectomy and Foraminotomy - Lumbar Three-Lumbar Four - Lumbar Four-Lumbar Five - Lumbar Five-Sacral One, laminectomy and m  . NECK SURGERY  2011  . TONSILECTOMY, ADENOIDECTOMY, BILATERAL MYRINGOTOMY AND TUBES      MEDICATIONS: . DULoxetine (CYMBALTA) 60 MG capsule  . ELIQUIS 5 MG TABS tablet  . gabapentin (NEURONTIN) 300 MG capsule  . losartan (COZAAR) 25 MG tablet  . Oxycodone HCl 10 MG TABS  . QUEtiapine (SEROQUEL) 100 MG tablet   No current facility-administered medications for this encounter.    Myra Gianotti, PA-C Surgical Short Stay/Anesthesiology [facility] Phone [phone] [facility] Phone [phone] 09/08/2019 1:40 PM

## 2019-09-08 NOTE — Telephone Encounter (Signed)
-----   Message from Wyatt Portela, MD sent at 09/08/2019  2:02 PM EST ----- 3 days is fine. Thanks ----- Message ----- From: Tami Lin, RN Sent: 09/08/2019   1:52 PM EST To: Wyatt Portela, MD  Patient is scheduled for a 2 level microdiscectomy on Monday 09/12/19. Dr. Windy Carina nurse wants to clarify when patient should hold Eliquis. Patient states he was instructed by you to hold med 2 days prior to surgery. Dr. Windy Carina nurse said it's usually 3 days prior so she just wants to confirm.  Lanelle Bal

## 2019-09-08 NOTE — Progress Notes (Signed)
PCP - Dr. Zettie Cooley Cardiologist - N/A Oncologist - Dr. Rodena Piety  Chest x-ray - 10/15/18 EKG - today Stress Test - denies ECHO - denies Cardiac Cath - denies  Sleep Study - over 20 years ago CPAP - No  Blood Thinner Instructions: Pt had not been instructed what to do about his Eliquis. I sent a text message to Dr. Rodena Piety while pt was here for his PAT appt. Dr. Rodena Piety replied quickly and instructed pt to hold 2 days prior to surgery. Last dose will be 09/09/19 and pt voiced understanding. Aspirin Instructions: N/A  COVID TEST- to be done today.  Reviewed Visitation policy with pt and he voiced understanding.   Anesthesia review: yes. See Allison's note  Patient denies shortness of breath, fever, cough and chest pain at PAT appointment   All instructions explained to the patient, with a verbal understanding of the material. Patient agrees to go over the instructions while at home for a better understanding. Patient also instructed to self quarantine after being tested for COVID-19. The opportunity to ask questions was provided.

## 2019-09-08 NOTE — Telephone Encounter (Signed)
Spoke to Dominica at [facility] and made her aware of Dr. Hazeline Junker instructions.

## 2019-09-09 LAB — NOVEL CORONAVIRUS, NAA (HOSP ORDER, SEND-OUT TO REF LAB; TAT 18-24 HRS): SARS-CoV-2, NAA: NOT DETECTED

## 2019-09-09 MED ORDER — DEXTROSE 5 % IV SOLN
3.0000 g | INTRAVENOUS | Status: AC
  Administered 2019-09-12: 3 g via INTRAVENOUS
  Filled 2019-09-09: qty 3000
  Filled 2019-09-09: qty 3
  Filled 2019-09-09: qty 3000

## 2019-09-12 ENCOUNTER — Other Ambulatory Visit: Payer: Self-pay

## 2019-09-12 ENCOUNTER — Ambulatory Visit (HOSPITAL_COMMUNITY)
Admission: RE | Admit: 2019-09-12 | Discharge: 2019-09-13 | Disposition: A | Discharge status: Home / Self Care | Payer: Medicaid Other | Attending: Neurosurgery | Admitting: Neurosurgery

## 2019-09-12 ENCOUNTER — Ambulatory Visit (HOSPITAL_COMMUNITY): Payer: Medicaid Other | Admitting: Vascular Surgery

## 2019-09-12 ENCOUNTER — Ambulatory Visit (HOSPITAL_COMMUNITY): Payer: Medicaid Other

## 2019-09-12 ENCOUNTER — Encounter (HOSPITAL_COMMUNITY)
Admission: RE | Disposition: A | Discharge status: Home / Self Care | Payer: Self-pay | Source: Home / Self Care | Attending: Neurosurgery

## 2019-09-12 ENCOUNTER — Encounter (HOSPITAL_COMMUNITY): Payer: Self-pay | Admitting: Neurosurgery

## 2019-09-12 ENCOUNTER — Ambulatory Visit (HOSPITAL_COMMUNITY): Payer: Medicaid Other | Admitting: Certified Registered Nurse Anesthetist

## 2019-09-12 DIAGNOSIS — Z7901 Long term (current) use of anticoagulants: Secondary | ICD-10-CM | POA: Diagnosis not present

## 2019-09-12 DIAGNOSIS — F17290 Nicotine dependence, other tobacco product, uncomplicated: Secondary | ICD-10-CM | POA: Insufficient documentation

## 2019-09-12 DIAGNOSIS — I10 Essential (primary) hypertension: Secondary | ICD-10-CM | POA: Insufficient documentation

## 2019-09-12 DIAGNOSIS — M5416 Radiculopathy, lumbar region: Secondary | ICD-10-CM | POA: Insufficient documentation

## 2019-09-12 DIAGNOSIS — M4800 Spinal stenosis, site unspecified: Secondary | ICD-10-CM | POA: Diagnosis present

## 2019-09-12 DIAGNOSIS — F319 Bipolar disorder, unspecified: Secondary | ICD-10-CM | POA: Insufficient documentation

## 2019-09-12 DIAGNOSIS — I739 Peripheral vascular disease, unspecified: Secondary | ICD-10-CM | POA: Diagnosis not present

## 2019-09-12 DIAGNOSIS — Z419 Encounter for procedure for purposes other than remedying health state, unspecified: Secondary | ICD-10-CM

## 2019-09-12 DIAGNOSIS — M48061 Spinal stenosis, lumbar region without neurogenic claudication: Secondary | ICD-10-CM | POA: Insufficient documentation

## 2019-09-12 DIAGNOSIS — Z6841 Body Mass Index (BMI) 40.0 and over, adult: Secondary | ICD-10-CM | POA: Insufficient documentation

## 2019-09-12 DIAGNOSIS — G4730 Sleep apnea, unspecified: Secondary | ICD-10-CM | POA: Diagnosis not present

## 2019-09-12 DIAGNOSIS — F419 Anxiety disorder, unspecified: Secondary | ICD-10-CM | POA: Insufficient documentation

## 2019-09-12 DIAGNOSIS — Z86711 Personal history of pulmonary embolism: Secondary | ICD-10-CM | POA: Diagnosis not present

## 2019-09-12 HISTORY — PX: LUMBAR LAMINECTOMY/DECOMPRESSION MICRODISCECTOMY: SHX5026

## 2019-09-12 LAB — PROTIME-INR
INR: 1 (ref 0.8–1.2)
Prothrombin Time: 13.5 seconds (ref 11.4–15.2)

## 2019-09-12 SURGERY — LUMBAR LAMINECTOMY/DECOMPRESSION MICRODISCECTOMY 2 LEVELS
Anesthesia: General | Site: Spine Lumbar | Laterality: Right

## 2019-09-12 MED ORDER — THROMBIN 5000 UNITS EX SOLR: OROMUCOSAL | Status: DC | PRN

## 2019-09-12 MED ORDER — PHENYLEPHRINE 40 MCG/ML (10ML) SYRINGE FOR IV PUSH (FOR BLOOD PRESSURE SUPPORT)
PREFILLED_SYRINGE | INTRAVENOUS | Status: DC | PRN
  Administered 2019-09-12: 40 ug via INTRAVENOUS
  Administered 2019-09-12 (×2): 80 ug via INTRAVENOUS
  Administered 2019-09-12: 120 ug via INTRAVENOUS

## 2019-09-12 MED ORDER — THROMBIN 5000 UNITS EX SOLR
CUTANEOUS | Status: DC | PRN
  Administered 2019-09-12 (×2): 5000 [IU] via TOPICAL

## 2019-09-12 MED ORDER — ESMOLOL HCL 100 MG/10ML IV SOLN
INTRAVENOUS | Status: DC | PRN
  Administered 2019-09-12: 20 ug via INTRAVENOUS

## 2019-09-12 MED ORDER — LIDOCAINE-EPINEPHRINE 1 %-1:100000 IJ SOLN
INTRAMUSCULAR | Status: DC | PRN
  Administered 2019-09-12: 10 mL

## 2019-09-12 MED ORDER — HEMOSTATIC AGENTS (NO CHARGE) OPTIME
TOPICAL | Status: DC | PRN
  Administered 2019-09-12: 1 via TOPICAL

## 2019-09-12 MED ORDER — PROMETHAZINE HCL 25 MG/ML IJ SOLN
INTRAMUSCULAR | Status: AC
  Administered 2019-09-12: 6.25 mg via INTRAVENOUS
  Filled 2019-09-12: qty 1

## 2019-09-12 MED ORDER — ALUM & MAG HYDROXIDE-SIMETH 200-200-20 MG/5ML PO SUSP: 30.0000 mL | Freq: Four times a day (QID) | ORAL | Status: DC | PRN

## 2019-09-12 MED ORDER — HYDROMORPHONE HCL 1 MG/ML IJ SOLN
INTRAMUSCULAR | Status: AC
  Administered 2019-09-12: 11:00:00 0.5 mg via INTRAVENOUS
  Filled 2019-09-12: qty 1

## 2019-09-12 MED ORDER — LOSARTAN POTASSIUM 50 MG PO TABS
25.0000 mg | ORAL_TABLET | Freq: Every day | ORAL | Status: DC
  Administered 2019-09-12: 14:00:00 25 mg via ORAL
  Filled 2019-09-12: qty 1

## 2019-09-12 MED ORDER — ACETAMINOPHEN 10 MG/ML IV SOLN
INTRAVENOUS | Status: AC
  Administered 2019-09-12: 11:00:00 1000 mg via INTRAVENOUS
  Filled 2019-09-12: qty 100

## 2019-09-12 MED ORDER — EPHEDRINE 5 MG/ML INJ
INTRAVENOUS | Status: AC
  Filled 2019-09-12: qty 10

## 2019-09-12 MED ORDER — ONDANSETRON HCL 4 MG/2ML IJ SOLN
INTRAMUSCULAR | Status: AC
  Filled 2019-09-12: qty 2

## 2019-09-12 MED ORDER — OXYCODONE HCL 5 MG PO TABS
10.0000 mg | ORAL_TABLET | ORAL | Status: DC | PRN
  Administered 2019-09-12 – 2019-09-13 (×6): 10 mg via ORAL
  Filled 2019-09-12 (×6): qty 2

## 2019-09-12 MED ORDER — BUPIVACAINE HCL (PF) 0.25 % IJ SOLN
INTRAMUSCULAR | Status: AC
  Filled 2019-09-12: qty 30

## 2019-09-12 MED ORDER — CYCLOBENZAPRINE HCL 10 MG PO TABS: 10.0000 mg | ORAL_TABLET | Freq: Three times a day (TID) | ORAL | Status: DC | PRN

## 2019-09-12 MED ORDER — SODIUM CHLORIDE 0.9 % IV SOLN: 250.0000 mL | INTRAVENOUS | Status: DC

## 2019-09-12 MED ORDER — HYDROMORPHONE HCL 1 MG/ML IJ SOLN
INTRAMUSCULAR | Status: DC | PRN
  Administered 2019-09-12: .5 mg via INTRAVENOUS

## 2019-09-12 MED ORDER — HYDROXYZINE HCL 50 MG/ML IM SOLN
50.0000 mg | Freq: Four times a day (QID) | INTRAMUSCULAR | Status: DC | PRN
  Administered 2019-09-12: 50 mg via INTRAMUSCULAR
  Filled 2019-09-12: qty 1

## 2019-09-12 MED ORDER — ACETAMINOPHEN 10 MG/ML IV SOLN: 1000.0000 mg | Freq: Once | INTRAVENOUS | Status: DC | PRN

## 2019-09-12 MED ORDER — DULOXETINE HCL 30 MG PO CPEP
60.0000 mg | ORAL_CAPSULE | Freq: Every day | ORAL | Status: DC
  Administered 2019-09-12: 14:00:00 60 mg via ORAL
  Filled 2019-09-12: qty 2

## 2019-09-12 MED ORDER — PROPOFOL 10 MG/ML IV BOLUS
INTRAVENOUS | Status: DC | PRN
  Administered 2019-09-12: 340 mg via INTRAVENOUS

## 2019-09-12 MED ORDER — LIDOCAINE-EPINEPHRINE 1 %-1:100000 IJ SOLN
INTRAMUSCULAR | Status: AC
  Filled 2019-09-12: qty 1

## 2019-09-12 MED ORDER — LACTATED RINGERS IV SOLN: INTRAVENOUS | Status: DC

## 2019-09-12 MED ORDER — MIDAZOLAM HCL 5 MG/5ML IJ SOLN
INTRAMUSCULAR | Status: DC | PRN
  Administered 2019-09-12: 2 mg via INTRAVENOUS

## 2019-09-12 MED ORDER — HYDROMORPHONE HCL 1 MG/ML IJ SOLN
0.5000 mg | INTRAMUSCULAR | Status: DC | PRN
  Administered 2019-09-12: 14:00:00 0.5 mg via INTRAVENOUS
  Filled 2019-09-12: qty 0.5

## 2019-09-12 MED ORDER — ACETAMINOPHEN 650 MG RE SUPP: 650.0000 mg | RECTAL | Status: DC | PRN

## 2019-09-12 MED ORDER — PHENYLEPHRINE HCL-NACL 10-0.9 MG/250ML-% IV SOLN
INTRAVENOUS | Status: DC | PRN
  Administered 2019-09-12: 25 ug/min via INTRAVENOUS

## 2019-09-12 MED ORDER — SUGAMMADEX SODIUM 500 MG/5ML IV SOLN
INTRAVENOUS | Status: AC
  Filled 2019-09-12: qty 5

## 2019-09-12 MED ORDER — ROCURONIUM BROMIDE 10 MG/ML (PF) SYRINGE
PREFILLED_SYRINGE | INTRAVENOUS | Status: DC | PRN
  Administered 2019-09-12: 30 mg via INTRAVENOUS
  Administered 2019-09-12: 50 mg via INTRAVENOUS

## 2019-09-12 MED ORDER — PHENOL 1.4 % MT LIQD: 1.0000 | OROMUCOSAL | Status: DC | PRN

## 2019-09-12 MED ORDER — SODIUM CHLORIDE 0.9 % IV SOLN: INTRAVENOUS | Status: DC | PRN

## 2019-09-12 MED ORDER — CHLORHEXIDINE GLUCONATE CLOTH 2 % EX PADS: 6.0000 | MEDICATED_PAD | Freq: Once | CUTANEOUS | Status: DC

## 2019-09-12 MED ORDER — ONDANSETRON HCL 4 MG/2ML IJ SOLN: 4.0000 mg | Freq: Four times a day (QID) | INTRAMUSCULAR | Status: DC | PRN

## 2019-09-12 MED ORDER — MENTHOL 3 MG MT LOZG: 1.0000 | LOZENGE | OROMUCOSAL | Status: DC | PRN

## 2019-09-12 MED ORDER — EPHEDRINE SULFATE-NACL 50-0.9 MG/10ML-% IV SOSY
PREFILLED_SYRINGE | INTRAVENOUS | Status: DC | PRN
  Administered 2019-09-12: 5 mg via INTRAVENOUS
  Administered 2019-09-12: 10 mg via INTRAVENOUS
  Administered 2019-09-12: 5 mg via INTRAVENOUS

## 2019-09-12 MED ORDER — SUCCINYLCHOLINE CHLORIDE 200 MG/10ML IV SOSY
PREFILLED_SYRINGE | INTRAVENOUS | Status: DC | PRN
  Administered 2019-09-12: 180 mg via INTRAVENOUS

## 2019-09-12 MED ORDER — HYDROMORPHONE HCL 1 MG/ML IJ SOLN
0.2500 mg | INTRAMUSCULAR | Status: DC | PRN
  Administered 2019-09-12: 11:00:00 0.5 mg via INTRAVENOUS

## 2019-09-12 MED ORDER — PROMETHAZINE HCL 25 MG/ML IJ SOLN: 6.2500 mg | INTRAMUSCULAR | Status: DC | PRN

## 2019-09-12 MED ORDER — ONDANSETRON HCL 4 MG/2ML IJ SOLN
INTRAMUSCULAR | Status: DC | PRN
  Administered 2019-09-12: 4 mg via INTRAVENOUS

## 2019-09-12 MED ORDER — QUETIAPINE FUMARATE 100 MG PO TABS
100.0000 mg | ORAL_TABLET | Freq: Every day | ORAL | Status: DC
  Administered 2019-09-12: 100 mg via ORAL
  Filled 2019-09-12: qty 1

## 2019-09-12 MED ORDER — SODIUM CHLORIDE 0.9% FLUSH: 3.0000 mL | INTRAVENOUS | Status: DC | PRN

## 2019-09-12 MED ORDER — PROPOFOL 10 MG/ML IV BOLUS
INTRAVENOUS | Status: AC
  Filled 2019-09-12: qty 40

## 2019-09-12 MED ORDER — FENTANYL CITRATE (PF) 250 MCG/5ML IJ SOLN
INTRAMUSCULAR | Status: DC | PRN
  Administered 2019-09-12: 50 ug via INTRAVENOUS
  Administered 2019-09-12: 100 ug via INTRAVENOUS
  Administered 2019-09-12 (×4): 50 ug via INTRAVENOUS

## 2019-09-12 MED ORDER — PANTOPRAZOLE SODIUM 40 MG PO TBEC
40.0000 mg | DELAYED_RELEASE_TABLET | Freq: Every day | ORAL | Status: DC
  Administered 2019-09-12: 40 mg via ORAL
  Filled 2019-09-12: qty 1

## 2019-09-12 MED ORDER — ACETAMINOPHEN 325 MG PO TABS: 650.0000 mg | ORAL_TABLET | ORAL | Status: DC | PRN

## 2019-09-12 MED ORDER — SUGAMMADEX SODIUM 200 MG/2ML IV SOLN
INTRAVENOUS | Status: DC | PRN
  Administered 2019-09-12: 325 mg via INTRAVENOUS

## 2019-09-12 MED ORDER — HYDROMORPHONE HCL 1 MG/ML IJ SOLN
INTRAMUSCULAR | Status: AC
  Filled 2019-09-12: qty 0.5

## 2019-09-12 MED ORDER — MIRABEGRON ER 50 MG PO TB24
50.0000 mg | ORAL_TABLET | Freq: Every day | ORAL | Status: DC
  Administered 2019-09-12: 17:00:00 50 mg via ORAL
  Filled 2019-09-12 (×2): qty 1

## 2019-09-12 MED ORDER — CYCLOBENZAPRINE HCL 10 MG PO TABS
10.0000 mg | ORAL_TABLET | Freq: Three times a day (TID) | ORAL | Status: DC | PRN
  Administered 2019-09-12 – 2019-09-13 (×2): 10 mg via ORAL
  Filled 2019-09-12 (×2): qty 1

## 2019-09-12 MED ORDER — MIDAZOLAM HCL 2 MG/2ML IJ SOLN
INTRAMUSCULAR | Status: AC
  Filled 2019-09-12: qty 2

## 2019-09-12 MED ORDER — LIDOCAINE 2% (20 MG/ML) 5 ML SYRINGE
INTRAMUSCULAR | Status: DC | PRN
  Administered 2019-09-12 (×2): 100 mg via INTRAVENOUS

## 2019-09-12 MED ORDER — FENTANYL CITRATE (PF) 250 MCG/5ML IJ SOLN
INTRAMUSCULAR | Status: AC
  Filled 2019-09-12: qty 5

## 2019-09-12 MED ORDER — CEFAZOLIN SODIUM-DEXTROSE 2-4 GM/100ML-% IV SOLN
2.0000 g | Freq: Three times a day (TID) | INTRAVENOUS | Status: AC
  Administered 2019-09-12 (×2): 2 g via INTRAVENOUS
  Filled 2019-09-12 (×2): qty 100

## 2019-09-12 MED ORDER — THROMBIN 5000 UNITS EX SOLR
CUTANEOUS | Status: AC
  Filled 2019-09-12: qty 10000

## 2019-09-12 MED ORDER — DEXAMETHASONE SODIUM PHOSPHATE 10 MG/ML IJ SOLN
10.0000 mg | Freq: Once | INTRAMUSCULAR | Status: AC
  Administered 2019-09-12: 10 mg via INTRAVENOUS
  Filled 2019-09-12: qty 1

## 2019-09-12 MED ORDER — DEXAMETHASONE SODIUM PHOSPHATE 10 MG/ML IJ SOLN
INTRAMUSCULAR | Status: AC
  Filled 2019-09-12: qty 1

## 2019-09-12 MED ORDER — 0.9 % SODIUM CHLORIDE (POUR BTL) OPTIME
TOPICAL | Status: DC | PRN
  Administered 2019-09-12: 1000 mL

## 2019-09-12 MED ORDER — PANTOPRAZOLE SODIUM 40 MG IV SOLR: 40.0000 mg | Freq: Every day | INTRAVENOUS | Status: DC

## 2019-09-12 MED ORDER — ONDANSETRON HCL 4 MG PO TABS: 4.0000 mg | ORAL_TABLET | Freq: Four times a day (QID) | ORAL | Status: DC | PRN

## 2019-09-12 MED ORDER — GABAPENTIN 300 MG PO CAPS
300.0000 mg | ORAL_CAPSULE | Freq: Three times a day (TID) | ORAL | Status: DC
  Administered 2019-09-12 (×2): 300 mg via ORAL
  Filled 2019-09-12 (×2): qty 1

## 2019-09-12 MED ORDER — SODIUM CHLORIDE 0.9% FLUSH
3.0000 mL | Freq: Two times a day (BID) | INTRAVENOUS | Status: DC
  Administered 2019-09-12: 22:00:00 3 mL via INTRAVENOUS

## 2019-09-12 SURGICAL SUPPLY — 53 items
BAG DECANTER FOR FLEXI CONT (MISCELLANEOUS) ×3 IMPLANT
BAND RUBBER #18 3X1/16 STRL (MISCELLANEOUS) ×6 IMPLANT
BENZOIN TINCTURE PRP APPL 2/3 (GAUZE/BANDAGES/DRESSINGS) ×3 IMPLANT
BLADE CLIPPER SURG (BLADE) ×3 IMPLANT
BLADE SURG 11 STRL SS (BLADE) ×3 IMPLANT
BUR CUTTER 7.0 ROUND (BURR) ×3 IMPLANT
BUR MATCHSTICK NEURO 3.0 LAGG (BURR) ×3 IMPLANT
CANISTER SUCT 3000ML PPV (MISCELLANEOUS) ×3 IMPLANT
CARTRIDGE OIL MAESTRO DRILL (MISCELLANEOUS) ×1 IMPLANT
CLOSURE WOUND 1/2 X4 (GAUZE/BANDAGES/DRESSINGS) ×1
DECANTER SPIKE VIAL GLASS SM (MISCELLANEOUS) ×3 IMPLANT
DERMABOND ADVANCED (GAUZE/BANDAGES/DRESSINGS) ×2
DERMABOND ADVANCED .7 DNX12 (GAUZE/BANDAGES/DRESSINGS) ×1 IMPLANT
DIFFUSER DRILL AIR PNEUMATIC (MISCELLANEOUS) ×3 IMPLANT
DRAPE HALF SHEET 40X57 (DRAPES) ×3 IMPLANT
DRAPE LAPAROTOMY 100X72X124 (DRAPES) ×3 IMPLANT
DRAPE MICROSCOPE LEICA (MISCELLANEOUS) ×3 IMPLANT
DRAPE SURG 17X23 STRL (DRAPES) ×3 IMPLANT
DRSG OPSITE POSTOP 4X6 (GAUZE/BANDAGES/DRESSINGS) ×3 IMPLANT
DURAPREP 26ML APPLICATOR (WOUND CARE) ×3 IMPLANT
ELECT BLADE 4.0 EZ CLEAN MEGAD (MISCELLANEOUS) ×3
ELECT REM PT RETURN 9FT ADLT (ELECTROSURGICAL) ×3
ELECTRODE BLDE 4.0 EZ CLN MEGD (MISCELLANEOUS) ×1 IMPLANT
ELECTRODE REM PT RTRN 9FT ADLT (ELECTROSURGICAL) ×1 IMPLANT
EVACUATOR 1/8 PVC DRAIN (DRAIN) ×3 IMPLANT
GAUZE 4X4 16PLY RFD (DISPOSABLE) IMPLANT
GAUZE SPONGE 4X4 12PLY STRL (GAUZE/BANDAGES/DRESSINGS) ×3 IMPLANT
GLOVE BIO SURGEON STRL SZ7 (GLOVE) ×6 IMPLANT
GLOVE BIO SURGEON STRL SZ8 (GLOVE) ×3 IMPLANT
GLOVE BIOGEL PI IND STRL 7.0 (GLOVE) ×2 IMPLANT
GLOVE BIOGEL PI INDICATOR 7.0 (GLOVE) ×4
GLOVE INDICATOR 8.5 STRL (GLOVE) ×3 IMPLANT
GOWN STRL REUS W/ TWL LRG LVL3 (GOWN DISPOSABLE) ×1 IMPLANT
GOWN STRL REUS W/ TWL XL LVL3 (GOWN DISPOSABLE) ×2 IMPLANT
GOWN STRL REUS W/TWL LRG LVL3 (GOWN DISPOSABLE) ×2
GOWN STRL REUS W/TWL XL LVL3 (GOWN DISPOSABLE) ×4
HEMOSTAT POWDER KIT SURGIFOAM (HEMOSTASIS) ×3 IMPLANT
KIT BASIN OR (CUSTOM PROCEDURE TRAY) ×3 IMPLANT
KIT TURNOVER KIT B (KITS) ×3 IMPLANT
NEEDLE HYPO 22GX1.5 SAFETY (NEEDLE) ×3 IMPLANT
NEEDLE SPNL 22GX3.5 QUINCKE BK (NEEDLE) ×3 IMPLANT
NS IRRIG 1000ML POUR BTL (IV SOLUTION) ×3 IMPLANT
OIL CARTRIDGE MAESTRO DRILL (MISCELLANEOUS) ×3
PACK LAMINECTOMY NEURO (CUSTOM PROCEDURE TRAY) ×3 IMPLANT
SPONGE SURGIFOAM ABS GEL SZ50 (HEMOSTASIS) ×3 IMPLANT
STRIP CLOSURE SKIN 1/2X4 (GAUZE/BANDAGES/DRESSINGS) ×2 IMPLANT
SUT VIC AB 0 CT1 18XCR BRD8 (SUTURE) ×1 IMPLANT
SUT VIC AB 0 CT1 8-18 (SUTURE) ×2
SUT VIC AB 2-0 CT1 18 (SUTURE) ×3 IMPLANT
SUT VICRYL 4-0 PS2 18IN ABS (SUTURE) ×3 IMPLANT
TOWEL GREEN STERILE (TOWEL DISPOSABLE) ×3 IMPLANT
TOWEL GREEN STERILE FF (TOWEL DISPOSABLE) ×3 IMPLANT
WATER STERILE IRR 1000ML POUR (IV SOLUTION) ×3 IMPLANT

## 2019-09-12 NOTE — Anesthesia Procedure Notes (Signed)
Procedure Name: Intubation Date/Time: 09/12/2019 7:46 AM Performed by: Colin Benton, CRNA Pre-anesthesia Checklist: Patient identified, Emergency Drugs available, Suction available and Patient being monitored Patient Re-evaluated:Patient Re-evaluated prior to induction Oxygen Delivery Method: Circle system utilized Preoxygenation: Pre-oxygenation with 100% oxygen Induction Type: IV induction Ventilation: Mask ventilation without difficulty Laryngoscope Size: Mac and 2 Grade View: Grade II Tube type: Oral Tube size: 7.5 mm Number of attempts: 1 Airway Equipment and Method: Stylet Placement Confirmation: ETT inserted through vocal cords under direct vision,  positive ETCO2 and breath sounds checked- equal and bilateral Secured at: 24 cm Tube secured with: Tape Dental Injury: Teeth and Oropharynx as per pre-operative assessment

## 2019-09-12 NOTE — Op Note (Signed)
Preoperative diagnosis: Lumbar spinal stenosis with right-sided L3-L4 radiculopathies  Postoperative diagnosis: Same  Procedure: Right-sided decompressive lumbar laminectomy L2-3 and redo decompressive laminectomy L3-4 with microdissection and microscopic foraminotomies of the L3 and L4 nerve roots on the right.  With partial medial facetectomies and foraminotomies of the L3-L4 nerve roots on the right  Surgeon: Dominica Severin Osher Oettinger  Assistant: Nash Shearer  Anesthesia: General  EBL: Minimal  HPI: 44 year old gentleman with back and right leg pain previously undergone a complete decompressive laminectomy from L4-S1 with undercutting of the L3 spinous process.  Patient had progressive worsening back and right leg pain rating down L3-L4 nerve root pattern work-up revealed severe foraminal stenosis and partial central stenosis at L3-4 as well as foraminal stenosis at L2-3.  Due to patient progression of clinical syndrome imaging findings entirely right with right-sided symptoms I recommended right-sided decompressive laminectomies at 2334.  I extensively went over the risks and benefits of that procedure with him as well as perioperative course expectations of outcome and alternatives of surgery and he understood and agreed to proceed forward.  Operative procedure: Patient brought into the OR was due to general anesthesia positioned prone the Wilson frame her back was prepped and draped in routine sterile fashion.  His old incision was opened up and extended slightly cephalad after infiltration of 10 cc lidocaine with epi scar tissue was dissected free and subperiosteal dissection was carried lamina of L2 and exposed the residual lamina of L3 down to the decompression site at 3 4.  Then utilizing high-speed drill the inferior and lateral aspect of the spinous process and lamina of L3 was drilled down with a high-speed drill inferior lamina of L2 was drilled down medial facet complexes at both levels were drilled  down laminotomy was begun with a 3 and 4 Miller Kerrison punch.  Marching up superiorly underneath the 2 lamina identified the edge of the ligamentum flavum which was removed in piecemeal fashion exposing the thecal sac.  Then under biting the medial gutter followed down and identified the L3 pedicle and performed a foraminotomy L3 nerve root and under microscopic lamination further marching down there was a large spur coming off the 3 4 facet causing severe thecal sac compression and proximal L4 nerve root compression.  This was all densely stuck and adherent to the dura from the previous surgery this was all teased away with a dental dissector for pain field and nerve hook extended down to below the level of the L4 pedicle through the scar tissue at the previous 3 for decompression.  Then unroofed the L4 foramen as well.  There was a lot of synovial material possibly to synovial cyst and mixed in with the extensive scar tissue all this was removed at the end of decompression was no further stenosis on the thecal sac L3 or L4 nerve root L3 and L4 foramens were widely patent.  Wounds are copious irrigated to Kassim states was maintained Gelfoam was ON top of the dura medium Hemovac drain was placed and the wound was closed in layers with Vicryl skin was closed running 4 subcuticular Dermabond benzoin Steri-Strips and a sterile dressing was applied patient recovery in stable condition.  At the end the case all needle count sponge counts were correct.

## 2019-09-12 NOTE — Anesthesia Preprocedure Evaluation (Signed)
Anesthesia Evaluation  Patient identified by MRN, date of birth, ID band Patient awake    Reviewed: Allergy & Precautions, NPO status , Patient's Chart, lab work & pertinent test results  Airway Mallampati: II  TM Distance: >3 FB Neck ROM: Full    Dental no notable dental hx.    Pulmonary sleep apnea and Continuous Positive Airway Pressure Ventilation , Current Smoker and Patient abstained from smoking.,    Pulmonary exam normal breath sounds clear to auscultation       Cardiovascular hypertension, Normal cardiovascular exam Rhythm:Regular Rate:Normal     Neuro/Psych Bipolar Disorder negative neurological ROS     GI/Hepatic Neg liver ROS, GERD  ,  Endo/Other  Morbid obesity  Renal/GU negative Renal ROS  negative genitourinary   Musculoskeletal negative musculoskeletal ROS (+)   Abdominal (+) + obese,   Peds negative pediatric ROS (+)  Hematology negative hematology ROS (+)   Anesthesia Other Findings   Reproductive/Obstetrics negative OB ROS                             Anesthesia Physical Anesthesia Plan  ASA: III  Anesthesia Plan: General   Post-op Pain Management:    Induction: Intravenous  PONV Risk Score and Plan: 2 and Ondansetron, Dexamethasone and Treatment may vary due to age or medical condition  Airway Management Planned: Oral ETT  Additional Equipment:   Intra-op Plan:   Post-operative Plan: Extubation in OR  Informed Consent: I have reviewed the patients History and Physical, chart, labs and discussed the procedure including the risks, benefits and alternatives for the proposed anesthesia with the patient or authorized representative who has indicated his/her understanding and acceptance.     Dental advisory given  Plan Discussed with: CRNA and Surgeon  Anesthesia Plan Comments:         Anesthesia Quick Evaluation

## 2019-09-12 NOTE — H&P (Signed)
Julian Lucas is an 44 y.o. male.   Chief Complaint: Back and right leg pain HPI: 44 year old gentleman with longstanding cervical thoracic and lumbar spine issues and said numbness tingling pain into his right leg going on for couple years now we have exhausted all forms conservative treatment with anti-inflammatories physical therapy steroids injections and physical therapy he does have spinal stenosis on imaging at L2-3 and L3-4.  Due to the predominance of his right-sided symptoms of recommended right-sided decompressive laminotomies L2-3 L3-4.  I extensively gone over the risks and benefits of that procedure with him as well as perioperative course expectations of outcome and alternatives of surgery and he understands and agrees to proceed forward.  Past Medical History:  Diagnosis Date  . Anxiety   . Bipolar disorder ([place])   . 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 Chronic pain of multiple joints   . DEGENERATIVE JOINT DISEASE, LEFT KNEE 09/09/2010   Qualifier: Diagnosis of  By: Oneida Alar MD, KARL    . Depression   . Family history of adverse reaction to anesthesia    mom takes a long time to wake up  . GERD (gastroesophageal reflux disease)   . HERPES GENITALIS 11/21/2009   Qualifier: Diagnosis of  By: Jeannine Kitten MD, Rodman Key    . Hypertension   . Multiple renal cysts   . Peripheral vascular disease ([place])   . Pneumonia   . PULMONARY EMBOLISM, HX OF 11/21/2009   Qualifier: Diagnosis of  By: Tye Savoy MD, Tommi Rumps    . Sleep apnea    does not use a cpap  . UNEQUAL LEG LENGTH 08/09/2010   Qualifier: Diagnosis of  By: Ernestina Patches MD, Remo Lipps      Past Surgical History:  Procedure Laterality Date  . ANTERIOR CERVICAL DECOMP/DISCECTOMY FUSION N/A 11/23/2017   Procedure: ANTERIOR CERVICAL DECOMPRESSION AND FUSION CERVICAL SIX-SEVEN;  Surgeon: Kary Kos, MD;  Location: [facility];  Service: Neurosurgery;  Laterality: N/A;  anterior  . KNEE SURGERY    . LUMBAR LAMINECTOMY/DECOMPRESSION  MICRODISCECTOMY Left 11/10/2018   Procedure: Laminectomy and Foraminotomy - Lumbar Three-Lumbar Four - Lumbar Four-Lumbar Five - Lumbar Five-Sacral One, laminectomy and microdiskectomy Thoracic Ten-Thoracic Eleven - left;  Surgeon: Kary Kos, MD;  Location: [facility];  Service: Neurosurgery;  Laterality: Left;  Laminectomy and Foraminotomy - Lumbar Three-Lumbar Four - Lumbar Four-Lumbar Five - Lumbar Five-Sacral One, laminectomy and m  . NECK SURGERY  2011  . TONSILECTOMY, ADENOIDECTOMY, BILATERAL MYRINGOTOMY AND TUBES      Family History  Problem Relation Age of Onset  . Lung cancer Father   . Alcohol abuse Father   . Depression Mother   . Hypertension Mother   . Heart disease Mother    Social History:  reports that he has been smoking cigars. He has never used smokeless tobacco. He reports previous alcohol use. He reports current drug use. Drug: Marijuana.  Allergies: No Known Allergies  Medications Prior to Admission  Medication Sig Dispense Refill  . DULoxetine (CYMBALTA) 60 MG capsule Take 1 capsule (60 mg total) by mouth every morning. (Patient taking differently: Take 60 mg by mouth daily. ) 90 capsule 0  . ELIQUIS 5 MG TABS tablet TAKE 1 TABLET BY MOUTH TWICE A DAY (Patient taking differently: Take 5 mg by mouth 2 (two) times daily. ) 180 tablet 0  . gabapentin (NEURONTIN) 300 MG capsule Take 1 capsule (300 mg total) by mouth 3 (three) times daily. 90 capsule 0  . losartan (COZAAR)  25 MG tablet Take 1 tablet (25 mg total) by mouth daily. 90 tablet 1  . Oxycodone HCl 10 MG TABS Take 1 tablet (10 mg total) by mouth every 6 (six) hours. (Patient taking differently: Take 10 mg by mouth every 6 (six) hours as needed (pain.). ) 12 tablet 0  . QUEtiapine (SEROQUEL) 100 MG tablet Take 1 tablet (100 mg total) by mouth at bedtime. 90 tablet 0    No results found for this or any previous visit (from the past 48 hour(s)). No results found.  Review of Systems  Musculoskeletal: Positive for  back pain and gait problem.  Neurological: Positive for numbness.    Blood pressure (!) 150/93, pulse 68, temperature 98.2 F (36.8 C), resp. rate 20, height 6\' 2"  (1.88 m), weight (!) 161.7 kg, SpO2 98 %. Physical Exam  Constitutional: He is oriented to person, place, and time. He appears well-developed.  HENT:  Head: Normocephalic.  Eyes: Pupils are equal, round, and reactive to light.  Respiratory: Effort normal.  GI: Soft.  Musculoskeletal:        General: Normal range of motion.     Cervical back: Normal range of motion.  Neurological: He is alert and oriented to person, place, and time. He has normal strength. GCS eye subscore is 4. GCS verbal subscore is 5. GCS motor subscore is 6.  Strength is 4 out of 5 iliopsoas, quads, hamstrings, gastrocs, into tibialis, and EHL.  Skin: Skin is warm and dry.     Assessment/Plan 44 year old gentleman presents for decompressive laminectomy right-sided L3-3 L3-4  Tameria Patti P, MD 09/12/2019, 7:26 AM

## 2019-09-12 NOTE — Anesthesia Postprocedure Evaluation (Signed)
Anesthesia Post Note  Patient: Julian Lucas  Procedure(s) Performed: Laminectomy and Foraminotomy - right - Lumbar Two-Three, Lumbar Three-Four (Right Spine Lumbar)     Patient location during evaluation: PACU Anesthesia Type: General Level of consciousness: awake and alert Pain management: pain level controlled Vital Signs Assessment: post-procedure vital signs reviewed and stable Respiratory status: spontaneous breathing, nonlabored ventilation, respiratory function stable and patient connected to nasal cannula oxygen Cardiovascular status: blood pressure returned to baseline and stable Postop Assessment: no apparent nausea or vomiting Anesthetic complications: no    Last Vitals:  Vitals:   09/12/19 1107 09/12/19 1141  BP: 118/78 (!) 135/99  Pulse: 60 (!) 55  Resp: 19 20  Temp: (!) 36.1 C 36.5 C  SpO2: 95% 100%    Last Pain:  Vitals:   09/12/19 1141  TempSrc: Oral  PainSc:                  Chea Malan S

## 2019-09-12 NOTE — Transfer of Care (Signed)
Immediate Anesthesia Transfer of Care Note  Patient: Julian Lucas  Procedure(s) Performed: Laminectomy and Foraminotomy - right - Lumbar Two-Three, Lumbar Three-Four (Right Spine Lumbar)  Patient Location: PACU  Anesthesia Type:General  Level of Consciousness: awake, alert , oriented and patient cooperative  Airway & Oxygen Therapy: Patient Spontanous Breathing and Patient connected to face mask oxygen  Post-op Assessment: Report given to RN and Post -op Vital signs reviewed and stable  Post vital signs: Reviewed and stable  Last Vitals:  Vitals Value Taken Time  BP 119/79 09/12/19 0951  Temp    Pulse 55 09/12/19 0958  Resp 7 09/12/19 0958  SpO2 95 % 09/12/19 0958  Vitals shown include unvalidated device data.  Last Pain:  Vitals:   09/12/19 0639  PainSc: 10-Worst pain ever         Complications: No apparent anesthesia complications

## 2019-09-13 DIAGNOSIS — M48061 Spinal stenosis, lumbar region without neurogenic claudication: Secondary | ICD-10-CM | POA: Diagnosis not present

## 2019-09-13 NOTE — Discharge Summary (Signed)
Physician Discharge Summary  Patient ID: Julian Lucas MRN: [id number] DOB/AGE: 43-Oct-1977 43 y.o. Estimated body mass index is 45.77 kg/m as calculated from the following:   Height as of this encounter: 6\' 2"  (1.88 m).   Weight as of this encounter: 161.7 kg.   Admit date: 09/12/2019 Discharge date: 09/13/2019  Admission Diagnoses: Lumbar spinal stenosis L2-3 L3-4  Discharge Diagnoses: Same Active Problems:   Spinal stenosis   Discharged Condition: good  Hospital Course: Patient is admitted to hospital underwent decompressive laminectomy L2-3 L3-4 and the right.  Postoperatively patient did very well covering the floor on the floor was ambulating and voiding spontaneously tolerating regular diet and stable for discharge home.  Patient will be discharged scheduled follow-up in a week to 2 weeks.  Patient is instructed to resume his Eliquis in 3 days.  Consults: Significant Diagnostic Studies: Treatments: Decompressive laminectomy L2-3 L3-4 Discharge Exam: Blood pressure (!) 134/99, pulse 64, temperature 98.7 F (37.1 C), temperature source Oral, resp. rate 18, height 6\' 2"  (1.88 m), weight (!) 161.7 kg, SpO2 97 %. Strength 5 out of 5 wound clean dry and intact  Disposition: Home   Allergies as of 09/13/2019   No Known Allergies     Medication List    STOP taking these medications   Eliquis 5 MG Tabs tablet Generic drug: apixaban     TAKE these medications   DULoxetine 60 MG capsule Commonly known as: CYMBALTA Take 1 capsule (60 mg total) by mouth every morning. What changed: when to take this   gabapentin 300 MG capsule Commonly known as: NEURONTIN Take 1 capsule (300 mg total) by mouth 3 (three) times daily.   losartan 25 MG tablet Commonly known as: COZAAR Take 1 tablet (25 mg total) by mouth daily.   mirabegron ER 50 MG Tb24 tablet Commonly known as: MYRBETRIQ Take 50 mg by mouth daily.   Oxycodone HCl 10 MG Tabs Take 1 tablet (10 mg total) by mouth  every 6 (six) hours. What changed:   when to take this  reasons to take this   QUEtiapine 100 MG tablet Commonly known as: SEROQUEL Take 1 tablet (100 mg total) by mouth at bedtime.        Signed: Jamesen Stahnke P 09/13/2019, 7:54 AM

## 2019-09-13 NOTE — Evaluation (Signed)
Physical Therapy Evaluation Patient Details Name: Julian Lucas MRN: [id number] DOB: 27-Apr-1976 Today's Date: 09/13/2019   History of Present Illness  Pt is a 44 y/o male with a PMH significant for leg length descrepency, PE, PVD, HTN, 3 prior spinal surgeries, knee surgery. He presents s/p L2-L3 laminectomy/decompression and redo of L3-L4 laminectomy/decompression on 09/12/2019.   Clinical Impression  Pt admitted with above diagnosis. At the time of PT eval, pt was able to demonstrate transfers and ambulation with gross supervision for safety with RW for support. Pt was educated on precautions, positioning recommendations, appropriate activity progression, and car transfer. Pt currently with functional limitations due to the deficits listed below (see PT Problem List). Pt will benefit from skilled PT to increase their independence and safety with mobility to allow discharge to the venue listed below.      Follow Up Recommendations No PT follow up;Supervision for mobility/OOB    Equipment Recommendations  None recommended by PT    Recommendations for Other Services       Precautions / Restrictions Precautions Precautions: Fall;Back Precaution Booklet Issued: Yes (comment) Precaution Comments: Reviewed precautions during functional mobility - pt is familiar from prior surgeries.  Restrictions Weight Bearing Restrictions: No      Mobility  Bed Mobility Overal bed mobility: Needs Assistance Bed Mobility: Sidelying to Sit;Rolling Rolling: Modified independent (Device/Increase time) Sidelying to sit: Supervision       General bed mobility comments: No assist required. pt demonstrated good log roll technique to transition to EOB. HOB flat to simulate home environment.  Transfers Overall transfer level: Needs assistance Equipment used: Rolling walker (2 wheeled) Transfers: Sit to/from Stand Sit to Stand: Supervision         General transfer comment: VC's for hand placement on  seated surface for safety as pt powered up to full stand. Very wide BOS but able to manage without LOB.   Ambulation/Gait Ambulation/Gait assistance: Supervision Gait Distance (Feet): 250 Feet Assistive device: Rolling walker (2 wheeled) Gait Pattern/deviations: Step-through pattern;Decreased stride length;Decreased weight shift to right;Trunk flexed Gait velocity: Decreased Gait velocity interpretation: <1.8 ft/sec, indicate of risk for recurrent falls General Gait Details: Noted pt with difficulty clearing floor and swinging LLE through during gait cycle. VC's for improved posture, closer walker proximity, and forward gaze throughout gait training.   Stairs Stairs: (Pt declined stair training)          Wheelchair Mobility    Modified Rankin (Stroke Patients Only)       Balance                                             Pertinent Vitals/Pain Pain Assessment: 0-10 Pain Score: 8  Pain Location: Incision site Pain Descriptors / Indicators: Operative site guarding;Grimacing Pain Intervention(s): Limited activity within patient's tolerance;Monitored during session;Repositioned    Home Living Family/patient expects to be discharged to:: Private residence Living Arrangements: Spouse/significant other;Children Available Help at Discharge: Family;Available PRN/intermittently Type of Home: Apartment Home Access: Stairs to enter Entrance Stairs-Rails: (R first flight, L second flight) Entrance Stairs-Number of Steps: 2 flights Home Layout: One level Home Equipment: Walker - 2 wheels;Cane - single point      Prior Function Level of Independence: Independent with assistive device(s)         Comments: using SPC for mobility     Hand Dominance   Dominant Hand: Right  Extremity/Trunk Assessment   Upper Extremity Assessment Upper Extremity Assessment: Defer to OT evaluation    Lower Extremity Assessment Lower Extremity Assessment: Generalized  weakness(Consistent with pre-op diagnosis)    Cervical / Trunk Assessment Cervical / Trunk Assessment: Other exceptions Cervical / Trunk Exceptions: s/p surgery  Communication   Communication: No difficulties  Cognition Arousal/Alertness: Awake/alert Behavior During Therapy: WFL for tasks assessed/performed Overall Cognitive Status: Within Functional Limits for tasks assessed                                        General Comments      Exercises     Assessment/Plan    PT Assessment Patient needs continued PT services  PT Problem List Decreased strength;Decreased activity tolerance;Decreased balance;Decreased mobility;Decreased knowledge of use of DME;Decreased safety awareness;Decreased knowledge of precautions;Pain       PT Treatment Interventions DME instruction;Gait training;Stair training;Functional mobility training;Therapeutic activities;Therapeutic exercise;Neuromuscular re-education;Patient/family education    PT Goals (Current goals can be found in the Care Plan section)  Acute Rehab PT Goals Patient Stated Goal: Home this morning PT Goal Formulation: With patient Time For Goal Achievement: 09/20/19 Potential to Achieve Goals: Good    Frequency Min 5X/week   Barriers to discharge        Co-evaluation               AM-PAC PT "6 Clicks" Mobility  Outcome Measure Help needed turning from your back to your side while in a flat bed without using bedrails?: None Help needed moving from lying on your back to sitting on the side of a flat bed without using bedrails?: A Little Help needed moving to and from a bed to a chair (including a wheelchair)?: A Little Help needed standing up from a chair using your arms (e.g., wheelchair or bedside chair)?: A Little Help needed to walk in hospital room?: A Little Help needed climbing 3-5 steps with a railing? : A Little 6 Click Score: 19    End of Session Equipment Utilized During Treatment: Gait  belt Activity Tolerance: Patient tolerated treatment well Patient left: in chair;with call bell/phone within reach Nurse Communication: Mobility status PT Visit Diagnosis: Unsteadiness on feet (R26.81);Pain Pain - part of body: (back)    Time: [id number] PT Time Calculation (min) (ACUTE ONLY): 22 min   Charges:   PT Evaluation $PT Eval Moderate Complexity: 1 Mod          Rolinda Roan, PT, DPT Acute Rehabilitation Services Pager: [phone] Office: [phone]   Thelma Comp 09/13/2019, 11:22 AM

## 2019-09-13 NOTE — Discharge Instructions (Signed)
Wound Care Keep incision covered and dry for one week.  If you shower prior to then, cover incision with plastic wrap.  You may remove outer bandage after one week and shower.  Do not put any creams, lotions, or ointments on incision. Leave steri-strips on neck.  They will fall off by themselves. Activity Walk each and every day, increasing distance each day. No lifting greater than 5 lbs.  Avoid bending, arching, or twisting. No driving for 2 weeks; may ride as a passenger locally. If provided with back brace, wear when out of bed.  It is not necessary to wear in bed. Diet Resume your normal diet.  Return to Work Will be discussed at you follow up appointment. Call Your Doctor If Any of These Occur Redness, drainage, or swelling at the wound.  Temperature greater than 101 degrees. Severe pain not relieved by pain medication. Incision starts to come apart. Follow Up Appt Call today for appointment in 1-2 weeks [phone]) or for problems.  If you have any hardware placed in your spine, you will need an x-ray before your appointment.   Laminectomy, Care After This sheet gives you information about how to care for yourself after your procedure. Your health care provider may also give you more specific instructions. If you have problems or questions, contact your health care provider. What can I expect after the procedure? After the procedure, it is common to have:  Some pain around your incision area.  Muscle tightening (spasms) across the back. Follow these instructions at home: Medicines  Take over-the-counter and prescription medicines only as told by your health care provider.  If you were prescribed an antibiotic medicine, use it as told by your health care provider. Do not stop using the antibiotic even if you start to feel better.  If you are taking blood thinners: ? Talk with your health care provider before you take any medicines that contain aspirin or NSAIDs, such as  ibuprofen. These medicines increase your risk for dangerous bleeding. ? Take your medicine exactly as told, at the same time every day. ? Avoid activities that could cause injury or bruising, and follow instructions about how to prevent falls. ? Wear a medical alert bracelet or carry a card that lists what medicines you take.  Ask your health care provider if the medicine prescribed to you: ? Requires you to avoid driving or using heavy machinery. ? Can cause constipation. You may need to take these actions to prevent or treat constipation:  Drink enough fluid to keep your urine pale yellow.  Take over-the-counter or prescription medicines.  Eat foods that are high in fiber, such as beans, whole grains, and fresh fruits and vegetables.  Limit foods that are high in fat and processed sugars, such as fried or sweet foods. ? Do not drink alcohol if you are taking prescription pain medicine. Incision care   Follow instructions from your health care provider about how to take care of your incision area. Make sure you: ? Wash your hands with soap and water before and after you apply medicine to the area or change your bandage (dressing). If soap and water are not available, use hand sanitizer. ? Change your dressing as told by your health care provider. ? Leave stitches (sutures), skin glue, or adhesive strips in place. These skin closures may need to stay in place for 2 weeks or longer. If adhesive strip edges start to loosen and curl up, you may trim the loose edges. Do  not remove adhesive strips completely unless your health care provider tells you to do that.  Check your incision area every day for signs of infection. Check for: ? More redness, swelling, or pain. ? Fluid or blood. ? Warmth. ? Pus or a bad smell. Activity   Rest as told by your health care provider.  Avoid bending or twisting at your waist. Always bend at your knees.  Avoid sitting for a long time without moving.  Get up to take short walks every 1-2 hours. This is important to improve blood flow and breathing. Ask for help if you feel weak or unsteady.  Do not lift anything that is heavier than 10 lb (4.5 kg) or the limit that your health care provider tells you, until he or she says that it is safe.  Do exercises as told by your health care provider, including breathing exercises.  Return to your normal activities as told by your health care provider. Ask your health care provider what activities are safe for you. General instructions   Do not drive for 2 weeks after your procedure or for as long as told by your health care provider.  Do not take baths, swim, or use a hot tub for 2 weeks, or until your incision has healed completely. Ask your health care provider if you may take showers after your dressing has been removed. You may only be allowed to take sponge baths.  Do not use any products that contain nicotine or tobacco, such as cigarettes, e-cigarettes, and chewing tobacco. These can delay bone healing after surgery. If you need help quitting, ask your health care provider.  Wear compression stockings as told by your health care provider. These stockings help to prevent blood clots and reduce swelling in your legs.  Keep all follow-up visits as told by your health care provider. This is important. Contact a health care provider if:  You have more redness, swelling, or pain around your incision area.  Your incision feels warm to the touch.  You are not able to return to activities or do exercises as told by your health care provider. Get help right away if you:  Have any of these signs of infection: ? Fluid or blood coming from your incision area. ? Pus or a bad smell coming from your incision area. ? Chills or a fever.  Feel dizzy or you faint while standing.  Develop a rash.  Develop shortness of breath or you have difficulty breathing.  Cannot control when you urinate or have  a bowel movement.  Become weak.  Are not able to use your legs. These symptoms may represent a serious problem that is an emergency. Do not wait to see if the symptoms will go away. Get medical help right away. Call your local emergency services (911 in the [place] Do not drive yourself to the hospital. Summary  After the procedure, it is common to have some pain around your incision area. You may also have muscle tightening (spasms) across the back.  Follow instructions from your health care provider about how to care for your incision area.  Do not lift anything that is heavier than 10 lb (4.5 kg) or the limit that your health care provider tells you, until he or she says that it is safe.  Contact your health care provider if you have more redness, swelling, or pain around your incision area or if your incision feels warm to the touch. These can be signs of infection. This  information is not intended to replace advice given to you by your health care provider. Make sure you discuss any questions you have with your health care provider. Document Revised: 03/07/2019 Document Reviewed: 03/07/2019 Elsevier Patient Education  [place].

## 2019-09-13 NOTE — Plan of Care (Signed)
Pt doing well. Pt given D/C instructions with verbal understanding. Rx's were sent to pharmacy by MD. Pt's incision is clean and dry with no sign of infection. Pt's Hemovac and IV were removed prior to D/C. Pt D/C'd home via wheelchair per MD order. Pt is stable @ D/C and has no other needs at this time. Holli Humbles, RN

## 2019-09-14 ENCOUNTER — Emergency Department (HOSPITAL_COMMUNITY): Payer: Medicaid Other

## 2019-09-14 ENCOUNTER — Other Ambulatory Visit: Payer: Self-pay

## 2019-09-14 ENCOUNTER — Emergency Department (HOSPITAL_COMMUNITY)
Admission: EM | Admit: 2019-09-14 | Discharge: 2019-09-14 | Disposition: A | Discharge status: Home / Self Care | Payer: Medicaid Other | Attending: Emergency Medicine | Admitting: Emergency Medicine

## 2019-09-14 DIAGNOSIS — Z79899 Other long term (current) drug therapy: Secondary | ICD-10-CM | POA: Diagnosis not present

## 2019-09-14 DIAGNOSIS — E669 Obesity, unspecified: Secondary | ICD-10-CM | POA: Insufficient documentation

## 2019-09-14 DIAGNOSIS — Z9889 Other specified postprocedural states: Secondary | ICD-10-CM | POA: Diagnosis not present

## 2019-09-14 DIAGNOSIS — I10 Essential (primary) hypertension: Secondary | ICD-10-CM | POA: Insufficient documentation

## 2019-09-14 DIAGNOSIS — F17290 Nicotine dependence, other tobacco product, uncomplicated: Secondary | ICD-10-CM | POA: Insufficient documentation

## 2019-09-14 DIAGNOSIS — R1084 Generalized abdominal pain: Secondary | ICD-10-CM | POA: Diagnosis not present

## 2019-09-14 LAB — COMPREHENSIVE METABOLIC PANEL
ALT: 23 U/L (ref 0–44)
AST: 19 U/L (ref 15–41)
Albumin: 3.6 g/dL (ref 3.5–5.0)
Alkaline Phosphatase: 81 U/L (ref 38–126)
Anion gap: 12 (ref 5–15)
BUN: 12 mg/dL (ref 6–20)
CO2: 24 mmol/L (ref 22–32)
Calcium: 9.1 mg/dL (ref 8.9–10.3)
Chloride: 101 mmol/L (ref 98–111)
Creatinine, Ser: 0.9 mg/dL (ref 0.61–1.24)
GFR calc Af Amer: >60 mL/min (ref >60)
GFR calc non Af Amer: >60 mL/min (ref >60)
Glucose, Bld: 96 mg/dL (ref 70–99)
Potassium: 3.8 mmol/L (ref 3.5–5.1)
Sodium: 137 mmol/L (ref 135–145)
Total Bilirubin: 1.9 mg/dL — ABNORMAL HIGH (ref 0.3–1.2)
Total Protein: 7.2 g/dL (ref 6.5–8.1)

## 2019-09-14 LAB — URINALYSIS, ROUTINE W REFLEX MICROSCOPIC
Bacteria, UA: NONE SEEN
Bilirubin Urine: NEGATIVE
Glucose, UA: NEGATIVE mg/dL
Hgb urine dipstick: NEGATIVE
Ketones, ur: 5 mg/dL — AB
Leukocytes,Ua: NEGATIVE
Nitrite: NEGATIVE
Protein, ur: 30 mg/dL — AB
Specific Gravity, Urine: 1.02 (ref 1.005–1.030)
pH: 6 (ref 5.0–8.0)

## 2019-09-14 LAB — CBC
HCT: 44.1 % (ref 39.0–52.0)
Hemoglobin: 14.5 g/dL (ref 13.0–17.0)
MCH: 31.4 pg (ref 26.0–34.0)
MCHC: 32.9 g/dL (ref 30.0–36.0)
MCV: 95.5 fL (ref 80.0–100.0)
Platelets: 200 10*3/uL (ref 150–400)
RBC: 4.62 MIL/uL (ref 4.22–5.81)
RDW: 12.2 % (ref 11.5–15.5)
WBC: 11.8 10*3/uL — ABNORMAL HIGH (ref 4.0–10.5)
nRBC: 0 % (ref 0.0–0.2)

## 2019-09-14 LAB — LIPASE, BLOOD: Lipase: 22 U/L (ref 11–51)

## 2019-09-14 MED ORDER — HYDROMORPHONE HCL 1 MG/ML IJ SOLN
1.0000 mg | Freq: Once | INTRAMUSCULAR | Status: AC
  Administered 2019-09-14: 1 mg via INTRAVENOUS
  Filled 2019-09-14: qty 1

## 2019-09-14 MED ORDER — FENTANYL CITRATE (PF) 100 MCG/2ML IJ SOLN
100.0000 ug | Freq: Once | INTRAMUSCULAR | Status: AC
  Administered 2019-09-14: 19:00:00 100 ug via INTRAVENOUS
  Filled 2019-09-14: qty 2

## 2019-09-14 MED ORDER — ONDANSETRON HCL 4 MG/2ML IJ SOLN
4.0000 mg | Freq: Once | INTRAMUSCULAR | Status: AC
  Administered 2019-09-14: 19:00:00 4 mg via INTRAVENOUS
  Filled 2019-09-14: qty 2

## 2019-09-14 MED ORDER — LORAZEPAM 2 MG/ML IJ SOLN
1.0000 mg | Freq: Once | INTRAMUSCULAR | Status: AC
  Administered 2019-09-14: 19:00:00 1 mg via INTRAVENOUS
  Filled 2019-09-14: qty 1

## 2019-09-14 MED ORDER — IOHEXOL 300 MG/ML  SOLN
100.0000 mL | Freq: Once | INTRAMUSCULAR | Status: AC | PRN
  Administered 2019-09-14: 22:00:00 100 mL via INTRAVENOUS

## 2019-09-14 MED ORDER — SODIUM CHLORIDE 0.9% FLUSH: 3.0000 mL | Freq: Once | INTRAVENOUS | Status: DC

## 2019-09-14 NOTE — ED Triage Notes (Signed)
Pt had back surgery on Monday.  Discharged yesterday.  C/o generalized abd pain and nausea x 1 year that has been worse since yesterday.  Abd distention.

## 2019-09-14 NOTE — ED Notes (Signed)
Culture sent down with urine.  

## 2019-09-14 NOTE — ED Provider Notes (Signed)
[facility] EMERGENCY DEPARTMENT Provider Note   CSN: [id number] Arrival date & time: 09/14/19  1514     History Chief Complaint  Patient presents with  . Abdominal Pain    Julian Lucas is a 44 y.o. male with past medical history significant for PVD, PE presenting to emergency department today with chief complaint of abdominal pain x2 days.  Patient states he had laminectomy and foraminotomy with Dr. Saintclair Halsted on 09/12/2019.  He states he was discharged from the hospital yesterday.  He states he felt well enough to be discharged from the hospital however when he got home later that evening he had abdominal pain and cramping.  He states the pain has been constant x1 day.  Pain is located throughout his abdomen and radiates to his low back.  The pain is constant. He states his abdomen feels distended.  His last bowel movement was x3 days ago. He rates pain 10/10 in severity.  He also has pain shooting down his right leg.  He was prescribed oxycodone for pain, he did not take anything today however.  He is also endorsing associated shortness of breath which he states is because he is in so much pain.  He denies any chest pain.  Patient was found to have a PE 1 year ago after a surgery.  He is currently on Eliquis however it was held due to his procedure.  He is supposed to resume Eliquis in 2 days per neurosurgery. He denies fever, chills, neck pain, gross hematuria, urinary frequency, dysuria, diarrhea, lower extremity edema, numbness, tingling, bowel/bladder incontinence, urinary retention, history of cancer, saddle anesthesia, history of IVDA.  History provided by patient with additional history obtained from chart review.      Past Medical History:  Diagnosis Date  . Anxiety   . Bipolar disorder ([place])   . 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 Chronic pain of multiple joints   . DEGENERATIVE JOINT DISEASE, LEFT KNEE 09/09/2010   Qualifier: Diagnosis  of  By: Oneida Alar MD, KARL    . Depression   . Family history of adverse reaction to anesthesia    mom takes a long time to wake up  . GERD (gastroesophageal reflux disease)   . HERPES GENITALIS 11/21/2009   Qualifier: Diagnosis of  By: Jeannine Kitten MD, Rodman Key    . Hypertension   . Multiple renal cysts   . Peripheral vascular disease ([place])   . Pneumonia   . PULMONARY EMBOLISM, HX OF 11/21/2009   Qualifier: Diagnosis of  By: Tye Savoy MD, Tommi Rumps    . Sleep apnea    does not use a cpap  . UNEQUAL LEG LENGTH 08/09/2010   Qualifier: Diagnosis of  By: Ernestina Patches MD, Remo Lipps      Patient Active Problem List   Diagnosis Date Noted  . Spinal stenosis 09/12/2019  . Pilar cysts 07/09/2019  . VTE (venous thromboembolism) 12/31/2018  . Cannabis use disorder, mild, abuse 09/02/2018  . Osteoarthritis of left knee, severe 02/26/2018  . Abdominal pain, right upper quadrant 02/09/2018  . Myelopathy ([place]) 11/23/2017  . 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 Right foot pain 11/09/2015  . Pain in joint, ankle and foot 11/09/2015  . Heart burn 11/18/2013  . Dysphagia, unspecified(787.20) 11/18/2013  . Cervical disc disorder with radiculopathy of cervical region 07/27/2013  . Sciatica 01/06/2013  . Chronic pain of multiple joints 09/09/2010  . UNEQUAL LEG LENGTH 08/09/2010  . Ankle pain, right 08/02/2010  .  Depression 04/25/2010  . Genital herpes 11/21/2009  . Severe obesity (BMI >= 40) ([place]) 11/21/2009  . TOBACCO ABUSE 11/21/2009  . HYPERTENSION, BENIGN ESSENTIAL 11/21/2009    Past Surgical History:  Procedure Laterality Date  . ANTERIOR CERVICAL DECOMP/DISCECTOMY FUSION N/A 11/23/2017   Procedure: ANTERIOR CERVICAL DECOMPRESSION AND FUSION CERVICAL SIX-SEVEN;  Surgeon: Kary Kos, MD;  Location: [facility];  Service: Neurosurgery;  Laterality: N/A;  anterior  . KNEE SURGERY    . LUMBAR LAMINECTOMY/DECOMPRESSION MICRODISCECTOMY Left 11/10/2018   Procedure: Laminectomy and Foraminotomy - Lumbar  Three-Lumbar Four - Lumbar Four-Lumbar Five - Lumbar Five-Sacral One, laminectomy and microdiskectomy Thoracic Ten-Thoracic Eleven - left;  Surgeon: Kary Kos, MD;  Location: [facility];  Service: Neurosurgery;  Laterality: Left;  Laminectomy and Foraminotomy - Lumbar Three-Lumbar Four - Lumbar Four-Lumbar Five - Lumbar Five-Sacral One, laminectomy and m  . LUMBAR LAMINECTOMY/DECOMPRESSION MICRODISCECTOMY Right 09/12/2019   Procedure: Laminectomy and Foraminotomy - right - Lumbar Two-Three, Lumbar Three-Four;  Surgeon: Kary Kos, MD;  Location: [facility];  Service: Neurosurgery;  Laterality: Right;  posterior  . NECK SURGERY  2011  . TONSILECTOMY, ADENOIDECTOMY, BILATERAL MYRINGOTOMY AND TUBES         Family History  Problem Relation Age of Onset  . Lung cancer Father   . Alcohol abuse Father   . Depression Mother   . Hypertension Mother   . Heart disease Mother     Social History   Tobacco Use  . Smoking status: Current Every Day Smoker    Types: Cigars  . Smokeless tobacco: Never Used  . Tobacco comment: 1-2 daily "Black and Milds"  Substance Use Topics  . Alcohol use: Not Currently  . Drug use: Yes    Types: Marijuana    Home Medications Prior to Admission medications   Medication Sig Start Date End Date Taking? Authorizing Provider  DULoxetine (CYMBALTA) 60 MG capsule Take 1 capsule (60 mg total) by mouth every morning. Patient taking differently: Take 60 mg by mouth daily.  06/27/19   Arfeen, Arlyce Harman, MD  gabapentin (NEURONTIN) 300 MG capsule Take 1 capsule (300 mg total) by mouth 3 (three) times daily. 06/29/19 08/31/20  Wilber Oliphant, MD  losartan (COZAAR) 25 MG tablet Take 1 tablet (25 mg total) by mouth daily. 04/15/19   Wilber Oliphant, MD  mirabegron ER (MYRBETRIQ) 50 MG TB24 tablet Take 50 mg by mouth daily.    [provider]  Oxycodone HCl 10 MG TABS Take 1 tablet (10 mg total) by mouth every 6 (six) hours. Patient taking differently: Take 10 mg by mouth every 6 (six)  hours as needed (pain.).  12/24/18   Law, Bea Graff, PA-C  QUEtiapine (SEROQUEL) 100 MG tablet Take 1 tablet (100 mg total) by mouth at bedtime. 06/27/19   Arfeen, Arlyce Harman, MD    Allergies    Patient has no known allergies.  Review of Systems   Review of Systems All other systems are reviewed and are negative for acute change except as noted in the HPI.  Physical Exam Updated Vital Signs BP (!) 144/99 (BP Location: Left Arm)   Pulse 85   Temp 99.3 F (37.4 C) (Oral)   Resp 19   SpO2 94%   Physical Exam Vitals and nursing note reviewed.  Constitutional:      General: He is not in acute distress.    Appearance: He is obese. He is not ill-appearing, toxic-appearing or diaphoretic.     Comments: He appears uncomfortable, shifting  positions frequently during exam  HENT:     Head: Normocephalic and atraumatic.     Right Ear: Tympanic membrane and external ear normal.     Left Ear: Tympanic membrane and external ear normal.     Nose: Nose normal.     Mouth/Throat:     Mouth: Mucous membranes are moist.     Pharynx: Oropharynx is clear.  Eyes:     General: No scleral icterus.       Right eye: No discharge.        Left eye: No discharge.     Extraocular Movements: Extraocular movements intact.     Conjunctiva/sclera: Conjunctivae normal.     Pupils: Pupils are equal, round, and reactive to light.  Neck:     Vascular: No JVD.  Cardiovascular:     Rate and Rhythm: Normal rate and regular rhythm.     Pulses: Normal pulses.          Radial pulses are 2+ on the right side and 2+ on the left side.       Dorsalis pedis pulses are 2+ on the right side and 2+ on the left side.     Heart sounds: Normal heart sounds.  Pulmonary:     Comments: Lungs clear to auscultation in all fields. Symmetric chest rise. No wheezing, rales, or rhonchi. Abdominal:     Comments: Abdomen is soft, mildly distended. With generalized tenderness.  No rigidity, no guarding. No peritoneal signs. Normoactive  bowel sounds  Musculoskeletal:        General: Normal range of motion.     Cervical back: Normal range of motion.       Back:     Right lower leg: No edema.     Left lower leg: No edema.  Skin:    General: Skin is warm and dry.     Capillary Refill: Capillary refill takes less than 2 seconds.     Findings: No rash.  Neurological:     Mental Status: He is oriented to person, place, and time.     GCS: GCS eye subscore is 4. GCS verbal subscore is 5. GCS motor subscore is 6.     Comments: Fluent speech, no facial droop.  Psychiatric:        Behavior: Behavior normal.     ED Results / Procedures / Treatments   Labs (all labs ordered are listed, but only abnormal results are displayed) Labs Reviewed  COMPREHENSIVE METABOLIC PANEL - Abnormal; Notable for the following components:      Result Value   Total Bilirubin 1.9 (*)    All other components within normal limits  CBC - Abnormal; Notable for the following components:   WBC 11.8 (*)    All other components within normal limits  URINALYSIS, ROUTINE W REFLEX MICROSCOPIC - Abnormal; Notable for the following components:   Ketones, ur 5 (*)    Protein, ur 30 (*)    All other components within normal limits  LIPASE, BLOOD    EKG EKG Interpretation  Date/Time:  Wednesday September 14 2019 18:11:25 EST Ventricular Rate:  90 PR Interval:    QRS Duration: 88 QT Interval:  341 QTC Calculation: 418 R Axis:   36 Text Interpretation: Sinus arrhythmia Abnormal R-wave progression, late transition ST elev, probable normal early repol pattern No significant change since last tracing Confirmed by Isla Pence [phone]) on 09/14/2019 6:20:51 PM   Radiology CT Angio Chest PE W/Cm &/Or Wo Cm  Result Date:  09/14/2019 CLINICAL DATA:  Abdominal pain. Abdominal distention. Recent back surgery. Shortness of breath. History of PE on Eliquis. The patient had. Eliquis for surgery. EXAM: CT ANGIOGRAPHY CHEST CT ABDOMEN AND PELVIS WITH CONTRAST  TECHNIQUE: Multidetector CT imaging of the chest was performed using the standard protocol during bolus administration of intravenous contrast. Multiplanar CT image reconstructions and MIPs were obtained to evaluate the vascular anatomy. Multidetector CT imaging of the abdomen and pelvis was performed using the standard protocol during bolus administration of intravenous contrast. CONTRAST:  145mL OMNIPAQUE IOHEXOL 300 MG/ML  SOLN COMPARISON:  CT dated February 11, 2019.  CT PE study dated Dec 28, 2018. FINDINGS: CTA CHEST FINDINGS Cardiovascular: Section of pulmonary emboli is severely limited by suboptimal contrast bolus timing.Given this limitation, there is no large centrally located pulmonary embolism. Detection of smaller pulmonary emboli is essentially not possible on this exam. The heart size is normal. There is no evidence for thoracic aortic dissection or aneurysm. There is no significant pericardial effusion. Mediastinum/Nodes: --No mediastinal or hilar lymphadenopathy. --No axillary lymphadenopathy. --No supraclavicular lymphadenopathy. --Normal thyroid gland. --The esophagus is unremarkable Lungs/Pleura: No pulmonary nodules or masses. No pleural effusion or pneumothorax. No focal airspace consolidation. No focal pleural abnormality. Musculoskeletal: No chest wall abnormality. No acute or significant osseous findings. The patient is status post prior ACDF of the lower cervical spine. A subcutaneous cyst is again noted in the upper midline chest wall. Bilateral gynecomastia is noted. Review of the MIP images confirms the above findings. CT ABDOMEN and PELVIS FINDINGS Hepatobiliary: This the liver surface appears mildly nodular. Multiple cysts are noted throughout the hepatic parenchyma. The gallbladder is unremarkable.There is no biliary ductal dilation. Pancreas: Normal contours without ductal dilatation. No peripancreatic fluid collection. Spleen: No splenic laceration or hematoma. Adrenals/Urinary Tract:  --Adrenal glands: No adrenal hemorrhage. --Right kidney/ureter: Again noted is a hyperdense 2.6 cm nodule involving the right kidney. This was previously evaluated on a prior MRI. --Left kidney/ureter: No hydronephrosis or perinephric hematoma. --Urinary bladder: Unremarkable. Stomach/Bowel: --Stomach/Duodenum: No hiatal hernia or other gastric abnormality. Normal duodenal course and caliber. --Small bowel: No dilatation or inflammation. --Colon: No focal abnormality. --Appendix: Normal. Vascular/Lymphatic: Atherosclerotic calcification is present within the non-aneurysmal abdominal aorta, without hemodynamically significant stenosis. --No retroperitoneal lymphadenopathy. --No mesenteric lymphadenopathy. --No pelvic or inguinal lymphadenopathy. Reproductive: Unremarkable Other: No ascites or free air. The abdominal wall is normal. Musculoskeletal. The patient is status post prior L3 through L5 posterior decompression. Postsurgical changes are noted posteriorly with soft tissue edema. There is a posterior fluid collection in the subcutaneous fat measuring approximately 4 x 4.1 cm. This is favored to represent a postoperative seroma or hematoma. Review of the MIP images confirms the above findings. IMPRESSION: 1. Evaluation for pulmonary emboli is severely limited by suboptimal contrast bolus timing. Given this limitation, no PE was identified. 2. The lungs are essentially clear. 3. No acute intra-abdominal abnormality. 4. Postsurgical changes of the lower lumbar spine. There is a fluid collection in the posterior subcutaneous fat at the level of the L3-L4 disc space favored to represent a postoperative seroma or hematoma. There is no definite CT evidence for a developing abscess. 5. Additional chronic findings as detailed above including a solid right renal lesion consistent with renal cell carcinoma. Outpatient urology follow-up is recommended for this finding. Aortic Atherosclerosis (ICD10-I70.0). Electronically  Signed   By: Constance Holster M.D.   On: 09/14/2019 22:40   CT ABDOMEN PELVIS W CONTRAST  Result Date: 09/14/2019 CLINICAL DATA:  Abdominal pain.  Abdominal distention. Recent back surgery. Shortness of breath. History of PE on Eliquis. The patient had. Eliquis for surgery. EXAM: CT ANGIOGRAPHY CHEST CT ABDOMEN AND PELVIS WITH CONTRAST TECHNIQUE: Multidetector CT imaging of the chest was performed using the standard protocol during bolus administration of intravenous contrast. Multiplanar CT image reconstructions and MIPs were obtained to evaluate the vascular anatomy. Multidetector CT imaging of the abdomen and pelvis was performed using the standard protocol during bolus administration of intravenous contrast. CONTRAST:  19mL OMNIPAQUE IOHEXOL 300 MG/ML  SOLN COMPARISON:  CT dated February 11, 2019.  CT PE study dated Dec 28, 2018. FINDINGS: CTA CHEST FINDINGS Cardiovascular: Section of pulmonary emboli is severely limited by suboptimal contrast bolus timing.Given this limitation, there is no large centrally located pulmonary embolism. Detection of smaller pulmonary emboli is essentially not possible on this exam. The heart size is normal. There is no evidence for thoracic aortic dissection or aneurysm. There is no significant pericardial effusion. Mediastinum/Nodes: --No mediastinal or hilar lymphadenopathy. --No axillary lymphadenopathy. --No supraclavicular lymphadenopathy. --Normal thyroid gland. --The esophagus is unremarkable Lungs/Pleura: No pulmonary nodules or masses. No pleural effusion or pneumothorax. No focal airspace consolidation. No focal pleural abnormality. Musculoskeletal: No chest wall abnormality. No acute or significant osseous findings. The patient is status post prior ACDF of the lower cervical spine. A subcutaneous cyst is again noted in the upper midline chest wall. Bilateral gynecomastia is noted. Review of the MIP images confirms the above findings. CT ABDOMEN and PELVIS FINDINGS  Hepatobiliary: This the liver surface appears mildly nodular. Multiple cysts are noted throughout the hepatic parenchyma. The gallbladder is unremarkable.There is no biliary ductal dilation. Pancreas: Normal contours without ductal dilatation. No peripancreatic fluid collection. Spleen: No splenic laceration or hematoma. Adrenals/Urinary Tract: --Adrenal glands: No adrenal hemorrhage. --Right kidney/ureter: Again noted is a hyperdense 2.6 cm nodule involving the right kidney. This was previously evaluated on a prior MRI. --Left kidney/ureter: No hydronephrosis or perinephric hematoma. --Urinary bladder: Unremarkable. Stomach/Bowel: --Stomach/Duodenum: No hiatal hernia or other gastric abnormality. Normal duodenal course and caliber. --Small bowel: No dilatation or inflammation. --Colon: No focal abnormality. --Appendix: Normal. Vascular/Lymphatic: Atherosclerotic calcification is present within the non-aneurysmal abdominal aorta, without hemodynamically significant stenosis. --No retroperitoneal lymphadenopathy. --No mesenteric lymphadenopathy. --No pelvic or inguinal lymphadenopathy. Reproductive: Unremarkable Other: No ascites or free air. The abdominal wall is normal. Musculoskeletal. The patient is status post prior L3 through L5 posterior decompression. Postsurgical changes are noted posteriorly with soft tissue edema. There is a posterior fluid collection in the subcutaneous fat measuring approximately 4 x 4.1 cm. This is favored to represent a postoperative seroma or hematoma. Review of the MIP images confirms the above findings. IMPRESSION: 1. Evaluation for pulmonary emboli is severely limited by suboptimal contrast bolus timing. Given this limitation, no PE was identified. 2. The lungs are essentially clear. 3. No acute intra-abdominal abnormality. 4. Postsurgical changes of the lower lumbar spine. There is a fluid collection in the posterior subcutaneous fat at the level of the L3-L4 disc space favored to  represent a postoperative seroma or hematoma. There is no definite CT evidence for a developing abscess. 5. Additional chronic findings as detailed above including a solid right renal lesion consistent with renal cell carcinoma. Outpatient urology follow-up is recommended for this finding. Aortic Atherosclerosis (ICD10-I70.0). Electronically Signed   By: Constance Holster M.D.   On: 09/14/2019 22:40    Procedures Procedures (including critical care time)  Medications Ordered in ED Medications  sodium chloride flush (NS) 0.9 % injection  3 mL (3 mLs Intravenous Not Given 09/14/19 1904)  fentaNYL (SUBLIMAZE) injection 100 mcg (100 mcg Intravenous Given 09/14/19 1839)  ondansetron (ZOFRAN) injection 4 mg (4 mg Intravenous Given 09/14/19 1841)  LORazepam (ATIVAN) injection 1 mg (1 mg Intravenous Given 09/14/19 1837)  HYDROmorphone (DILAUDID) injection 1 mg (1 mg Intravenous Given 09/14/19 2044)  iohexol (OMNIPAQUE) 300 MG/ML solution 100 mL (100 mLs Intravenous Contrast Given 09/14/19 2159)    ED Course  I have reviewed the triage vital signs and the nursing notes.  Pertinent labs & imaging results that were available during my care of the patient were reviewed by me and considered in my medical decision making (see chart for details).  Vitals:   09/14/19 1525 09/14/19 2318 09/14/19 2322  BP: (!) 144/99 130/65   Pulse: 85  94  Resp: 19 16 (!) 22  Temp: 99.3 F (37.4 C)    TempSrc: Oral    SpO2: 94%  95%      MDM Rules/Calculators/A&P                     Patient seen and examined. Patient presents awake, alert, hemodynamically stable, afebrile, non toxic.  He appears uncomfortable however does not appear toxic.  He is shifting frequently during the exam trying to get a comfortable position.  No chest tenderness on exam.  His lungs are clear to auscultation in all fields.  He has normal work of breathing.  Abdomen is slightly distended generalized tenderness.  Normoactive bowel sounds.  No  lower extremity edema.  Sensation intact to bilateral lower extremities.  He is moving all extremities equally. Incision appears to be healing well, dressing is still clean dry and intact. No signs of infection.  CBC with mild leukocytosis at 11.8, hemoglobin 14.5 is consistent with baseline.  CMP unremarkable besides total bili of 1.9.  No severe electrolyte derangement, no renal insufficiency.  Lipase within normal range.  UA without signs of infection. CT chest and abdomen ordered to further evaluate pain.   After my initial exam patient felt short of breath and like he could not breathe.  I immediately evaluated the patient and he appeared very anxious.  He had stable O2 saturation at 99% on room air.  He was saying it was hard to breathe because of the pain. Airway is intact.  Patient given IV Ativan and pain medicine with symptom improvement.  CTA is negative for PE.  CT A/P shows a small fluid collection in posterior subcutaneous fat level likely to be a post op seroma or hematoma. No findings to on CT or physical exam to suggest abscess. Radiologist also comments on hyperdense 2.6 cm nodule involving the right kidney. Patient recently had an MRI for this and is currently being followed by urologist Dr. Karsten Ro. When discussing results with patient he admits the abdominal pain has been present >1 year. Will recommend outpatient GI eval for further evaluation of this chronic pain  The patient appears reasonably screened and/or stabilized for discharge and I doubt any other medical condition or other [facility] requiring further screening, evaluation, or treatment in the ED at this time prior to discharge. The patient is safe for discharge with strict return precautions discussed. Recommend neurogsurgery follow up for pain and possible outpatient GI follow up. The patient was discussed with and seen by Dr. Gilford Raid who agrees with the treatment plan.   Portions of this note were generated with Geographical information systems officer. Dictation errors may occur despite best  attempts at proofreading.   Final Clinical Impression(s) / ED Diagnoses Final diagnoses:  Generalized abdominal pain    Rx / DC Orders ED Discharge Orders    None       Flint Melter 09/14/19 2327    Isla Pence, MD 09/14/19 2333

## 2019-09-14 NOTE — ED Notes (Signed)
Pt called out in distress, said he couldn't breathe. Grabbed provider and MD. gave pt ativan and pain medications.

## 2019-09-14 NOTE — Discharge Instructions (Addendum)
You have been seen today for abdominal pain and shortness of breath. Please read and follow all provided instructions. Return to the emergency room for worsening condition or new concerning symptoms.    1. Medications: Continue usual home medications Take medications as prescribed. Please review all of the medicines and only take them if you do not have an allergy to them.   2. Treatment: rest, drink plenty of fluids  3. Follow Up:  Please call [facility] to schedule an outpatient follow up appointment. If you are told you need a referral you should get one through your primary care doctor.  -Also recommend you follow up with your neurosurgeon if you continue to have back pain.   It is also a possibility that you have an allergic reaction to any of the medicines that you have been prescribed - Everybody reacts differently to medications and while MOST people have no trouble with most medicines, you may have a reaction such as nausea, vomiting, rash, swelling, shortness of breath. If this is the case, please stop taking the medicine immediately and contact your physician.  ?

## 2019-09-15 ENCOUNTER — Other Ambulatory Visit: Payer: Medicaid Other

## 2019-09-17 NOTE — Progress Notes (Signed)
09/17/2019 Julian Lucas [id number] Apr 23, 1976   HISTORY OF PRESENT ILLNESS:  Julian Lucas is a 44 year old male with a past medical history of obesity, anxiety, depression, bipolar, hypertension, PE 2011 and sleep apnea (he does not use cpap).  He is S/P laminectomy right sided L3-3 L3-4 by Dr. Saintclair Halsted on 09/12/2019, discharged home on 09/13/2019. He presented to [facility] ED on 09/14/2019 with abdominal pan and SOB. No BM x 3 days. A chest CTA was negative for PE. WBC 11.8. T. Bili 1.9.  Abdominal CT was negative for any acute intra-abdominal abnormality.  A 2.6cm solid right renal lesion consistent with renal cell carcinoma was identified.  He has seen urologist, Dr. Karsten Ro to evaluate his right kidney mass. No plans for surgical intervention at this time. He stated a repeat abdominal  MRI will be done in 6 months.  He presents today for further GI evaluation regarding generalized abdominal pain, bloat and cramping which started 2 years ago.  He denies any specific illness or stressors which triggered the symptoms.  He describes feeling as if a balloon is in his abdomen.  His abdomen is always bloated and distended without any noticeable improvement overnight and into the morning.  He develops generalized cramping which causes diffuse abdominal pain and sometimes it makes it difficult to take a deep breath in.  All foods worsens his abdominal bloat and pain. No dysphagia.  He has infrequent heartburn for which he takes Rolaids approximately once or twice monthly with relief.  No nausea.  However, he vomited brown emesis last night around 11 PM.  He typically does not vomit.  He ate dinner around 5:30 PM which consisted of baked chicken with gravy, but broccoli with cheese and mashed potatoes.  His wife ate the same foods without any symptoms.  He typically passes a normal formed brown bowel movement daily.  However, since his back surgery he is taken to codon 10 mg 1 tab 2-3 times daily for the past 2 to 3 days  which is resulted in constipation.  He is now passing hard balls of stool daily for the past 3 to 4 days.  He took 2 over-the-counter laxative pills on Friday 1/22 and Sunday 1/24.  He presents with a congested cough.  He stated having cold symptoms a week ago.  His Covid SARS 19 test 09/08/2019 was negative. He is afebrile. He reports having shortness of breath for the past 6 months which he attributes to his abdominal distention.  No hemoptysis.  His urine is darker yellow in color, he is having some urinary incontinence.  He is voiding less frequently over the past few days.  His wife said he urinated twice yesterday but the volume of urine was large.  He denies having any obvious hematuria or dysuria.  He is having a moderate amount of back pain this time is associated with his recent surgery.  No family history of esophageal, gastric or colorectal cancer.  His sister had part of her colon removed but the details are unclear, she did not have cancer. Father with history of lung cancer.  His wife is present.   Labs 09/14/2019: Sodium 137.  Potassium 3.8.  Glucose 96.  BUN 12.  Creatinine 0.90.  Alk phos 81.  Albumin 3.6.  AST 19.  ALT 23.  Total bili 1.9.  Lipase 22.  WBC 11.8.  Hemoglobin 14.5.  Hematocrit 44.1.  MCV 95.5.  Platelet 200.  CTA and Abdominal/pelvic CT with  contrast 09/14/2019: 1. Evaluation for pulmonary emboli is severely limited by suboptimal contrast bolus timing. Given this limitation, no PE was identified. 2. The lungs are essentially clear. 3. No acute intra-abdominal abnormality. 4. Postsurgical changes of the lower lumbar spine. There is a fluid collection in the posterior subcutaneous fat at the level of the L3-L4 disc space favored to represent a postoperative seroma or hematoma. There is no definite CT evidence for a developing abscess. 5. Additional chronic findings as detailed above including a 2.6cm solid right renal lesion consistent with renal cell carcinoma.  Outpatient urology follow-up is recommended for this finding. Aortic Atherosclerosis   Abdominal MRI w/wo contrast 09/05/2019: 1. Diminished exam detail secondary to patient body habitus and respiratory motion artifact. 2. No significant change in size of enhancing lesion arising from the lateral cortex of the right kidney. This is favored to represent a small renal cell carcinoma. 3. Stable appearance of bilateral, too small to characterize T1 hypointense lesions which are too small to characterize T1 hypointense lesions. Attention on follow-up imaging is advised. 4. Hepatic cysts.  Past Medical History:  Diagnosis Date  . Anxiety   . Bipolar disorder ([place])   . 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 Chronic pain of multiple joints   . DEGENERATIVE JOINT DISEASE, LEFT KNEE 09/09/2010   Qualifier: Diagnosis of  By: Oneida Alar MD, KARL    . Depression   . Family history of adverse reaction to anesthesia    mom takes a long time to wake up  . GERD (gastroesophageal reflux disease)   . HERPES GENITALIS 11/21/2009   Qualifier: Diagnosis of  By: Jeannine Kitten MD, Rodman Key    . Hypertension   . Multiple renal cysts   . Peripheral vascular disease ([place])   . Pneumonia   . PULMONARY EMBOLISM, HX OF 11/21/2009   Qualifier: Diagnosis of  By: Tye Savoy MD, Tommi Rumps    . Sleep apnea    does not use a cpap  . UNEQUAL LEG LENGTH 08/09/2010   Qualifier: Diagnosis of  By: Ernestina Patches MD, Remo Lipps     Past Surgical History:  Procedure Laterality Date  . ANTERIOR CERVICAL DECOMP/DISCECTOMY FUSION N/A 11/23/2017   Procedure: ANTERIOR CERVICAL DECOMPRESSION AND FUSION CERVICAL SIX-SEVEN;  Surgeon: Kary Kos, MD;  Location: [place];  Service: Neurosurgery;  Laterality: N/A;  anterior  . KNEE SURGERY    . LUMBAR LAMINECTOMY/DECOMPRESSION MICRODISCECTOMY Left 11/10/2018   Procedure: Laminectomy and Foraminotomy - Lumbar Three-Lumbar Four - Lumbar Four-Lumbar Five - Lumbar Five-Sacral One, laminectomy  and microdiskectomy Thoracic Ten-Thoracic Eleven - left;  Surgeon: Kary Kos, MD;  Location: [place];  Service: Neurosurgery;  Laterality: Left;  Laminectomy and Foraminotomy - Lumbar Three-Lumbar Four - Lumbar Four-Lumbar Five - Lumbar Five-Sacral One, laminectomy and m  . LUMBAR LAMINECTOMY/DECOMPRESSION MICRODISCECTOMY Right 09/12/2019   Procedure: Laminectomy and Foraminotomy - right - Lumbar Two-Three, Lumbar Three-Four;  Surgeon: Kary Kos, MD;  Location: [place];  Service: Neurosurgery;  Laterality: Right;  posterior  . NECK SURGERY  2011  . TONSILECTOMY, ADENOIDECTOMY, BILATERAL MYRINGOTOMY AND TUBES      reports that he has been smoking cigars. He has never used smokeless tobacco. He reports previous alcohol use. He reports current drug use. Drug: Marijuana. family history includes Alcohol abuse in his father; Depression in his mother; Heart disease in his mother; Hypertension in his mother; Lung cancer in his father. No Known Allergies    Outpatient Encounter Medications as of 09/19/2019  Medication Sig  .  DULoxetine (CYMBALTA) 60 MG capsule Take 1 capsule (60 mg total) by mouth every morning. (Patient taking differently: Take 60 mg by mouth daily. )  . gabapentin (NEURONTIN) 300 MG capsule Take 1 capsule (300 mg total) by mouth 3 (three) times daily.  Marland Kitchen losartan (COZAAR) 25 MG tablet Take 1 tablet (25 mg total) by mouth daily.  . mirabegron ER (MYRBETRIQ) 50 MG TB24 tablet Take 50 mg by mouth daily.  . Oxycodone HCl 10 MG TABS Take 1 tablet (10 mg total) by mouth every 6 (six) hours. (Patient taking differently: Take 10 mg by mouth every 6 (six) hours as needed (pain.). )  . QUEtiapine (SEROQUEL) 100 MG tablet Take 1 tablet (100 mg total) by mouth at bedtime.   No facility-administered encounter medications on file as of 09/19/2019.    REVIEW OF SYSTEMS  : All other systems reviewed and negative except where noted in the History of Present Illness.  PHYSICAL EXAM: BP 112/60   Pulse  60   Temp (!) 97.2 F (36.2 C)   Ht '6\' 2"'  (1.88 m)   Wt (!) 360 lb (163.3 kg)   BMI 46.22 kg/m   General: Well developed obese 44 year old male in no acute distress. Head: Normocephalic and atraumatic. Eyes:  Sclerae non-icteric, conjunctive pink. Ears: Normal auditory acuity. Mouth: Dentition intact, no ulcers or lesions.  Neck: Supple, no lymphadenopathy or thyromegaly.  Lungs: Clear bilaterally to auscultation without wheezes, crackles or rhonchi. Heart: Regular rate and rhythm. No murmur, rub or gallop appreciated.  Abdomen: Soft, distended, moderate tenderness throughout the abdomen without rebound or guarding. Normoactive bowel sounds x 4 quadrants. No HSM. Rectal: Deferred. Musculoskeletal: Symmetrical with no gross deformities. Skin: Warm and dry. No rash or lesions on visible extremities. Extremities: No edema. Neurological: Alert oriented x 4, no focal deficits.  Psychological:  Alert and cooperative. Normal mood and affect.  ASSESSMENT AND PLAN:  40. 44 year old male with chronic generalized abdominal pain and bloat. Mild reflux symptoms.  One episode of vomiting which consisted of brown emesis, not described as coffee-ground. -Omeprazole 40 mg 1 capsule p.o. once daily -Eventually need an EGD and colonoscopy, defer until he has completely recovered from his laminectomy surgery and when his respiratory status is stable -Dicyclomine 20 mg 1 p.o. 3 times daily as needed -Decrease Oxycodone use -Ondansetron 33m ODT one tab Q 6 to 8 hrs PRN -CBC, CMP, CRP, TSH and IgA, tTG  2. Constipation secondary to narcotic use  -Miralax daily -Phillip's bacteria probiotic once daily  -See plan # 1  3. Right kidney mass suspicious for renal cell carcinoma under current evaluation by urologist, Dr. OKarsten Ro 4.  S/P laminectomy right sided L3-3 L3-4 by Dr. CSaintclair Halstedon 09/12/2019.   5. History of upper extremity DVT 12/2018 provoked after thoracic decompressive laminectomy. PE in 2011  (Coumadin x 6 months). On Eliquis since 12/2018. Followed by hematologist  Dr. FZola Button Negative Hypercoagulable work-up.   6. SOB with a congested cough  -follow up with PCP for further evaluation, may require repeat COVID testing   7. Morbid obesity. BMI 46.2%  8. Liver cysts, live surface appears nodular on CTAP 09/14/2019. MRI abdomen 09/05/2019 liver cysts, no evidence of cirrhosis. Normal AST/ALT. T. Bili 1.9. -repeat CMP    CC:  KWilber Oliphant MD

## 2019-09-19 ENCOUNTER — Encounter: Payer: Self-pay | Admitting: Nurse Practitioner

## 2019-09-19 ENCOUNTER — Other Ambulatory Visit (INDEPENDENT_AMBULATORY_CARE_PROVIDER_SITE_OTHER): Payer: Medicaid Other

## 2019-09-19 ENCOUNTER — Ambulatory Visit: Payer: Medicaid Other | Admitting: Nurse Practitioner

## 2019-09-19 VITALS — BP 112/60 | HR 60 | Temp 97.2°F | Ht 74.0 in | Wt 360.0 lb

## 2019-09-19 DIAGNOSIS — R140 Abdominal distension (gaseous): Secondary | ICD-10-CM

## 2019-09-19 DIAGNOSIS — K5909 Other constipation: Secondary | ICD-10-CM

## 2019-09-19 DIAGNOSIS — R1084 Generalized abdominal pain: Secondary | ICD-10-CM

## 2019-09-19 DIAGNOSIS — R112 Nausea with vomiting, unspecified: Secondary | ICD-10-CM

## 2019-09-19 DIAGNOSIS — R1110 Vomiting, unspecified: Secondary | ICD-10-CM | POA: Insufficient documentation

## 2019-09-19 LAB — CBC WITH DIFFERENTIAL/PLATELET
Basophils Absolute: 0.1 10*3/uL (ref 0.0–0.1)
Basophils Relative: 1.8 % (ref 0.0–3.0)
Eosinophils Absolute: 0.1 10*3/uL (ref 0.0–0.7)
Eosinophils Relative: 1.7 % (ref 0.0–5.0)
HCT: 41.7 % (ref 39.0–52.0)
Hemoglobin: 13.7 g/dL (ref 13.0–17.0)
Lymphocytes Relative: 35.8 % (ref 12.0–46.0)
Lymphs Abs: 2.4 10*3/uL (ref 0.7–4.0)
MCHC: 32.9 g/dL (ref 30.0–36.0)
MCV: 93.3 fl (ref 78.0–100.0)
Monocytes Absolute: 1 10*3/uL (ref 0.1–1.0)
Monocytes Relative: 14.2 % — ABNORMAL HIGH (ref 3.0–12.0)
Neutro Abs: 3.1 10*3/uL (ref 1.4–7.7)
Neutrophils Relative %: 46.5 % (ref 43.0–77.0)
Platelets: 276 10*3/uL (ref 150.0–400.0)
RBC: 4.47 Mil/uL (ref 4.22–5.81)
RDW: 12.8 % (ref 11.5–15.5)
WBC: 6.7 10*3/uL (ref 4.0–10.5)

## 2019-09-19 LAB — COMPREHENSIVE METABOLIC PANEL
ALT: 13 U/L (ref 0–53)
AST: 10 U/L (ref 0–37)
Albumin: 3.9 g/dL (ref 3.5–5.2)
Alkaline Phosphatase: 82 U/L (ref 39–117)
BUN: 17 mg/dL (ref 6–23)
CO2: 26 mEq/L (ref 19–32)
Calcium: 9.3 mg/dL (ref 8.4–10.5)
Chloride: 106 mEq/L (ref 96–112)
Creatinine, Ser: 0.68 mg/dL (ref 0.40–1.50)
GFR: 154.17 mL/min (ref >60.00)
Glucose, Bld: 101 mg/dL — ABNORMAL HIGH (ref 70–99)
Potassium: 4 mEq/L (ref 3.5–5.1)
Sodium: 139 mEq/L (ref 135–145)
Total Bilirubin: 0.5 mg/dL (ref 0.2–1.2)
Total Protein: 7.9 g/dL (ref 6.0–8.3)

## 2019-09-19 LAB — C-REACTIVE PROTEIN: CRP: 6.3 mg/dL (ref 0.5–20.0)

## 2019-09-19 LAB — LIPASE: Lipase: 10 U/L — ABNORMAL LOW (ref 11.0–59.0)

## 2019-09-19 LAB — IGA: IgA: 255 mg/dL (ref 68–378)

## 2019-09-19 LAB — TSH: TSH: 2.16 u[IU]/mL (ref 0.35–4.50)

## 2019-09-19 MED ORDER — DICYCLOMINE HCL 20 MG PO TABS: 20.0000 mg | ORAL_TABLET | Freq: Three times a day (TID) | ORAL | 0 refills | Status: DC

## 2019-09-19 MED ORDER — ONDANSETRON HCL 4 MG PO TABS: ORAL_TABLET | ORAL | 0 refills | Status: AC

## 2019-09-19 MED ORDER — OMEPRAZOLE 40 MG PO CPDR: DELAYED_RELEASE_CAPSULE | ORAL | 0 refills | Status: DC

## 2019-09-19 NOTE — Patient Instructions (Addendum)
If you are age 43 or older, your body mass index should be between 23-30. Your Body mass index is 46.22 kg/m. If this is out of the aforementioned range listed, please consider follow up with your Primary Care Provider.  If you are age 46 or younger, your body mass index should be between 19-25. Your Body mass index is 46.22 kg/m. If this is out of the aformentioned range listed, please consider follow up with your Primary Care Provider.   We have sent the following medications to your pharmacy for you to pick up at your convenience:  Dicyclomine Ondansetron Omeprazole  Please pick up Miralax and start using it twice a day as tolerated Hardin Negus probiotic daily' These are available over the counter Follow up with Urology for your symptoms.   Your provider has requested that you go to the basement level for lab work before leaving today. Press "B" on the elevator. The lab is located at the first door on the left as you exit the elevator.   Follow up with your PCP regarding your cough Due to recent changes in healthcare laws, you may see the results of your imaging and laboratory studies on MyChart before your provider has had a chance to review them.  We understand that in some cases there may be results that are confusing or concerning to you. Not all laboratory results come back in the same time frame and the provider may be waiting for multiple results in order to interpret others.  Please give [place] 48 hours in order for your provider to thoroughly review all the results before contacting the office for clarification of your results.   Thank you for choosing [facility] Noralyn Pick, CRNP

## 2019-09-20 LAB — TISSUE TRANSGLUTAMINASE, IGA: (tTG) Ab, IgA: 1 U/mL

## 2019-09-20 NOTE — Progress Notes (Signed)
Reviewed and agree with documentation and assessment and plan. K. Veena Aiyden Lauderback , MD   

## 2019-09-22 ENCOUNTER — Telehealth: Payer: Self-pay | Admitting: Nurse Practitioner

## 2019-09-22 ENCOUNTER — Other Ambulatory Visit (HOSPITAL_COMMUNITY): Payer: Self-pay | Admitting: Psychiatry

## 2019-09-22 DIAGNOSIS — F0630 Mood disorder due to known physiological condition, unspecified: Secondary | ICD-10-CM

## 2019-09-22 DIAGNOSIS — F331 Major depressive disorder, recurrent, moderate: Secondary | ICD-10-CM

## 2019-09-22 NOTE — Telephone Encounter (Signed)
Called and spoke with patient-patient given information and has requested to be scheduled for an ROV-patient has been scheduled for 09/23/2019 at 3:20 pm with Dr. Silverio Decamp; Patient advised to call back to the office at [phone] should questions/concerns arise;  Patient verbalized understanding of information/instructions;

## 2019-09-22 NOTE — Telephone Encounter (Signed)
Called and spoke with patient-patient reports he took the Bentyl but it has" made my abdominal pain worse and I just think I can take anymore of that stuff";  Please advise of plan of care as patient reports if the "pain does not ease off I may have to go to the ER"; no further details were given from the patient-patient just wants abd pain to "ease up"

## 2019-09-22 NOTE — Telephone Encounter (Signed)
Bre, labs are normal, abd CT and abd MRI do not explain his pain in setting of possible renal cancer. His is s/p back surgery, he has narcotics. He's had chronic abd pain x 2 years. If his acute on chronic abd pain is significantly worse than when I saw him then he should go to the ED. If not severe, then he should be seen by one of our physicians. If he is constipated he should take Magnesium citrate 1  10 ounce bottle. Pls let me know outcome. Thx

## 2019-09-23 ENCOUNTER — Ambulatory Visit: Payer: Medicaid Other | Admitting: Gastroenterology

## 2019-09-26 ENCOUNTER — Encounter: Payer: Self-pay | Admitting: Family Medicine

## 2019-09-26 ENCOUNTER — Ambulatory Visit (INDEPENDENT_AMBULATORY_CARE_PROVIDER_SITE_OTHER): Payer: Medicaid Other | Admitting: Family Medicine

## 2019-09-26 ENCOUNTER — Other Ambulatory Visit: Payer: Self-pay

## 2019-09-26 ENCOUNTER — Encounter (HOSPITAL_COMMUNITY): Payer: Self-pay | Admitting: Psychiatry

## 2019-09-26 ENCOUNTER — Ambulatory Visit (INDEPENDENT_AMBULATORY_CARE_PROVIDER_SITE_OTHER): Payer: Medicaid Other | Admitting: Psychiatry

## 2019-09-26 DIAGNOSIS — F331 Major depressive disorder, recurrent, moderate: Secondary | ICD-10-CM | POA: Diagnosis not present

## 2019-09-26 DIAGNOSIS — F0630 Mood disorder due to known physiological condition, unspecified: Secondary | ICD-10-CM

## 2019-09-26 DIAGNOSIS — R1011 Right upper quadrant pain: Secondary | ICD-10-CM | POA: Diagnosis not present

## 2019-09-26 DIAGNOSIS — J410 Simple chronic bronchitis: Secondary | ICD-10-CM

## 2019-09-26 MED ORDER — DULOXETINE HCL 60 MG PO CPEP: 60.0000 mg | ORAL_CAPSULE | ORAL | 0 refills | Status: DC

## 2019-09-26 MED ORDER — AEROCHAMBER MINI CHAMBER DEVI: 1.0000 [IU] | Freq: Every day | 0 refills | Status: AC

## 2019-09-26 MED ORDER — QUETIAPINE FUMARATE 100 MG PO TABS: 100.0000 mg | ORAL_TABLET | Freq: Every day | ORAL | 0 refills | Status: DC

## 2019-09-26 MED ORDER — ALBUTEROL SULFATE HFA 108 (90 BASE) MCG/ACT IN AERS: 2.0000 | INHALATION_SPRAY | RESPIRATORY_TRACT | 0 refills | Status: AC | PRN

## 2019-09-26 NOTE — Progress Notes (Signed)
  Subjective  Julian Lucas is a 44 y.o. male who presents to clinic today with the following problems:  Cough Productive x a few weeks. Mucous is brown. No fevers at home. Has been taking his eliquis appropriately. No hx of asthma or COPD. Cough is worse at nighttime. Patient reports having wheezing sometimes. It wakes patient from sleep. Was evaluated for OSA when 50-33 years old and has not used since. Has never used an inhaler in the past.  Patient reports that he has some brown specks in his phlegm when he does cough.  Abdominal Pain Recently went the ED for this. He reports that it feels like getting punched in the ribs. The GI doctor gave him pills to help. Pain worsens when he eats (which he hasn't been doing much because of the pain). The pain has been ongoing for the last year. Has been to the ED multiple times.   Objective  Physical Exam BP 130/80   Pulse 72   Ht 6\' 2"  (1.88 m)   Wt (!) 360 lb 8 oz (163.5 kg)   SpO2 99%   BMI 46.29 kg/m  General: Well appearing male, NAD.  Not in respiratory distress.  Able to walk into the clinic without being overly short of breath.  Productive cough appreciated.  Assessment & Plan    Problem List Items Addressed This Visit      Active Problems   Abdominal pain, right upper quadrant    Addressed abdominal pain again today.  Patient has tenderness to his right upper quadrant.  He reports tenseness.  His abdomen does not appear to be particularly protuberant.  He does not have any tympany on percussion.  Pain located in right upper quadrant.  Patient reports this exact pain has been going on for a year with multiple ED visits.  Patient encouraged to follow-up with gastroenterology soon as possible for upper and lower endoscopies.      Smokers' cough ([place])    Provided patient with albuterol as pulmonary function testing is likely difficult to obtain at this time given Covid pandemic.  Patient given instructions for reasons to return to the  office or send me a MyChart message.  Patient reports he does not currently smoke tobacco but does smoke marijuana.  I have encouraged him to stop inhaling harmful substances that irritate his lungs as it can further exacerbate this issue.  Patient does also report shortness of breath at night and I wonder if this is OSA plus or minus OHS.  Patient does not have any bilateral lower extremity edema or wheezing on exam today.  No recent chest x-rays or imaging.  See patient instructions for return parameters         Health Maintenance discussed with patient and patient agrees to address when able.  Health Maintenance Due  Topic Date Due  . Samul Dada  12/10/2018    Wilber Oliphant, M.D.  10:19 AM 09/26/2019

## 2019-09-26 NOTE — Assessment & Plan Note (Signed)
Provided patient with albuterol as pulmonary function testing is likely difficult to obtain at this time given Covid pandemic.  Patient given instructions for reasons to return to the office or send me a MyChart message.  Patient reports he does not currently smoke tobacco but does smoke marijuana.  I have encouraged him to stop inhaling harmful substances that irritate his lungs as it can further exacerbate this issue.  Patient does also report shortness of breath at night and I wonder if this is OSA plus or minus OHS.  Patient does not have any bilateral lower extremity edema or wheezing on exam today.  No recent chest x-rays or imaging.  See patient instructions for return parameters

## 2019-09-26 NOTE — Assessment & Plan Note (Signed)
Addressed abdominal pain again today.  Patient has tenderness to his right upper quadrant.  He reports tenseness.  His abdomen does not appear to be particularly protuberant.  He does not have any tympany on percussion.  Pain located in right upper quadrant.  Patient reports this exact pain has been going on for a year with multiple ED visits.  Patient encouraged to follow-up with gastroenterology soon as possible for upper and lower endoscopies.

## 2019-09-26 NOTE — Patient Instructions (Addendum)
Dear Julian Lucas,   It was good to see you! Thank you for taking your time to come in to be seen. Today, we discussed the following:   Cough (x 1 week)   Start using the albuterol inhaler when you feel short of breath or hear the whistling noises when you breathe. I expect one of three outcomes. Take 1-2 puffs every 4 hours with spacer as needed.  It makes you feel better and you're using it more than 2 times a week to help you breathe, we will likely need to get pulmonary function testing.    If it does not help, stop using it  If it makes you feel better for a while and then you get no relief with it, let us know.   Stop smoking   Abdominal Pain    Schedule the colonoscopy and upper endoscopy as soon as possible   You are due for the following Health Maintenance items. Please schedule an appointment to address these prior to leaving.  Health Maintenance Due  Topic Date Due  . TETANUS/TDAP  12/10/2018    Be well,   Zettie Cooley, M.D   [facility] [phone]  *Sign up for MyChart for instant access to your health profile, labs, orders, upcoming appointments or to contact your provider with questions*  ===================================================================================
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
## 2019-10-03 ENCOUNTER — Other Ambulatory Visit: Payer: Self-pay | Admitting: Family Medicine

## 2019-10-03 ENCOUNTER — Ambulatory Visit (INDEPENDENT_AMBULATORY_CARE_PROVIDER_SITE_OTHER): Payer: Medicaid Other | Admitting: Gastroenterology

## 2019-10-03 ENCOUNTER — Encounter: Payer: Self-pay | Admitting: Gastroenterology

## 2019-10-03 VITALS — BP 118/82 | HR 71 | Ht 74.0 in | Wt 360.0 lb

## 2019-10-03 DIAGNOSIS — R1084 Generalized abdominal pain: Secondary | ICD-10-CM | POA: Diagnosis not present

## 2019-10-03 DIAGNOSIS — R140 Abdominal distension (gaseous): Secondary | ICD-10-CM

## 2019-10-03 DIAGNOSIS — K219 Gastro-esophageal reflux disease without esophagitis: Secondary | ICD-10-CM | POA: Diagnosis not present

## 2019-10-03 DIAGNOSIS — C641 Malignant neoplasm of right kidney, except renal pelvis: Secondary | ICD-10-CM

## 2019-10-03 DIAGNOSIS — K5903 Drug induced constipation: Secondary | ICD-10-CM | POA: Diagnosis not present

## 2019-10-03 MED ORDER — LUBIPROSTONE 24 MCG PO CAPS: 24.0000 ug | ORAL_CAPSULE | Freq: Two times a day (BID) | ORAL | 3 refills | Status: AC

## 2019-10-03 MED ORDER — GABAPENTIN 300 MG PO CAPS: 300.0000 mg | ORAL_CAPSULE | Freq: Three times a day (TID) | ORAL | 0 refills | Status: DC

## 2019-10-03 NOTE — Patient Instructions (Signed)
We have sent Amitiza to your pharmacy  STOP Dicyclomine  We will refer you to Urology and contact you with that appointment  Take Hyoscyamine 1 tablet twice a day as needed   I appreciate the  opportunity to care for you  Thank You   Harl Bowie , MD

## 2019-10-03 NOTE — Progress Notes (Signed)
Julian Lucas    [id number]    1975/11/19  Primary Care Physician:Kim, Charlyne Quale, MD  Referring Physician: Wilber Oliphant, MD [address],  [place] [address]   Chief complaint:  Abdominal pain  HPI:  44 year old male with history of morbid obesity, bipolar disorder, hypertension, OSA not on CPAP here for follow-up visit with complaints of persistent generalized abdominal discomfort and sensation of tightness.  He is s/p laminectomy right side L3 and L4.  CT chest angio January 2021 - for PE.  Abdominal CT negative for any acute pathology other than 2.6 cm right renal solid lesion concerning for renal cell carcinoma.  Patient states he is unaware of the renal cell carcinoma, does not recall discussing it with neurology.  He was referred to Dr. Karsten Ro and the recommendation was follow-up MRI in 6 months.  He has penile dysfunction, fecal and urinary incontinence with decreased sensation.  He is having 1 bowel movement daily but says it is very large and is associated with fecal urgency.  No blood in stool no melena. No dysphagia, vomiting, hematemesis.  No decreased appetite or weight loss. He is on chronic narcotics since back surgery.  No family history of GI malignancy.   Outpatient Encounter Medications as of 10/03/2019  Medication Sig  . albuterol (VENTOLIN HFA) 108 (90 Base) MCG/ACT inhaler Inhale 2 puffs into the lungs every 4 (four) hours as needed for wheezing or shortness of breath.  . cyclobenzaprine (FLEXERIL) 5 MG tablet Take 5 mg by mouth 3 (three) times daily as needed for muscle spasms.  Marland Kitchen dicyclomine (BENTYL) 20 MG tablet Take 1 tablet (20 mg total) by mouth 3 (three) times daily.  . DULoxetine (CYMBALTA) 60 MG capsule Take 1 capsule (60 mg total) by mouth every morning.  . gabapentin (NEURONTIN) 300 MG capsule Take 1 capsule (300 mg total) by mouth 3 (three) times daily.  Marland Kitchen losartan (COZAAR) 25 MG tablet Take 1 tablet (25 mg total) by mouth  daily.  . mirabegron ER (MYRBETRIQ) 50 MG TB24 tablet Take 50 mg by mouth daily.  Marland Kitchen omeprazole (PRILOSEC) 40 MG capsule Take in the morning 30 minutes before breakfast  . ondansetron (ZOFRAN) 4 MG tablet Place 1 tablet under your tongue to dissolve  . Oxycodone HCl 10 MG TABS Take 1 tablet (10 mg total) by mouth every 6 (six) hours. (Patient taking differently: Take 10 mg by mouth every 6 (six) hours as needed (pain.). )  . QUEtiapine (SEROQUEL) 100 MG tablet Take 1 tablet (100 mg total) by mouth at bedtime.  Marland Kitchen Spacer/Aero-Holding Chambers (AEROCHAMBER MINI CHAMBER) DEVI 1 Units by Does not apply route daily. Use with inhaler   No facility-administered encounter medications on file as of 10/03/2019.    Allergies as of 10/03/2019  . (No Known Allergies)    Past Medical History:  Diagnosis Date  . Anxiety   . Bipolar disorder ([place])   . 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 Chronic pain of multiple joints   . DEGENERATIVE JOINT DISEASE, LEFT KNEE 09/09/2010   Qualifier: Diagnosis of  By: Oneida Alar MD, KARL    . Depression   . Family history of adverse reaction to anesthesia    mom takes a long time to wake up  . GERD (gastroesophageal reflux disease)   . HERPES GENITALIS 11/21/2009   Qualifier: Diagnosis of  By: Jeannine Kitten MD, Rodman Key    . Hypertension   .  Multiple renal cysts   . Peripheral vascular disease ([place])   . Pneumonia   . PULMONARY EMBOLISM, HX OF 11/21/2009   Qualifier: Diagnosis of  By: Tye Savoy MD, Tommi Rumps    . Sleep apnea    does not use a cpap  . UNEQUAL LEG LENGTH 08/09/2010   Qualifier: Diagnosis of  By: Ernestina Patches MD, Remo Lipps      Past Surgical History:  Procedure Laterality Date  . ANTERIOR CERVICAL DECOMP/DISCECTOMY FUSION N/A 11/23/2017   Procedure: ANTERIOR CERVICAL DECOMPRESSION AND FUSION CERVICAL SIX-SEVEN;  Surgeon: Kary Kos, MD;  Location: [facility];  Service: Neurosurgery;  Laterality: N/A;  anterior  . KNEE SURGERY    . LUMBAR  LAMINECTOMY/DECOMPRESSION MICRODISCECTOMY Left 11/10/2018   Procedure: Laminectomy and Foraminotomy - Lumbar Three-Lumbar Four - Lumbar Four-Lumbar Five - Lumbar Five-Sacral One, laminectomy and microdiskectomy Thoracic Ten-Thoracic Eleven - left;  Surgeon: Kary Kos, MD;  Location: [facility];  Service: Neurosurgery;  Laterality: Left;  Laminectomy and Foraminotomy - Lumbar Three-Lumbar Four - Lumbar Four-Lumbar Five - Lumbar Five-Sacral One, laminectomy and m  . LUMBAR LAMINECTOMY/DECOMPRESSION MICRODISCECTOMY Right 09/12/2019   Procedure: Laminectomy and Foraminotomy - right - Lumbar Two-Three, Lumbar Three-Four;  Surgeon: Kary Kos, MD;  Location: [facility];  Service: Neurosurgery;  Laterality: Right;  posterior  . NECK SURGERY  2011  . TONSILECTOMY, ADENOIDECTOMY, BILATERAL MYRINGOTOMY AND TUBES      Family History  Problem Relation Age of Onset  . Lung cancer Father   . Alcohol abuse Father   . Depression Mother   . Hypertension Mother   . Heart disease Mother   . Diabetes Mother   . Pancreatic cancer Maternal Uncle   . Colon cancer Neg Hx   . Stomach cancer Neg Hx   . Esophageal cancer Neg Hx     Social History   Socioeconomic History  . Marital status: Significant Other    Spouse name: Not on file  . Number of children: 2  . Years of education: Not on file  . Highest education level: High school graduate  Occupational History    Comment: unemployed  Tobacco Use  . Smoking status: Former Smoker    Types: Cigars  . Smokeless tobacco: Never Used  . Tobacco comment: 1-2 daily "Black and Milds"  Substance and Sexual Activity  . Alcohol use: Never  . Drug use: Yes    Types: Marijuana    Comment: daily   . Sexual activity: Not Currently  Other Topics Concern  . Not on file  Social History Narrative   Lives with wife    caffeine- none   Social Determinants of Health   Financial Resource Strain:   . Difficulty of Paying Living Expenses: Not on file  Food Insecurity:   .  Worried About Charity fundraiser in the Last Year: Not on file  . Ran Out of Food in the Last Year: Not on file  Transportation Needs:   . Lack of Transportation (Medical): Not on file  . Lack of Transportation (Non-Medical): Not on file  Physical Activity:   . Days of Exercise per Week: Not on file  . Minutes of Exercise per Session: Not on file  Stress:   . Feeling of Stress : Not on file  Social Connections:   . Frequency of Communication with Friends and Family: Not on file  . Frequency of Social Gatherings with Friends and Family: Not on file  . Attends Religious Services: Not on file  . Active Member of  Clubs or Organizations: Not on file  . Attends Archivist Meetings: Not on file  . Marital Status: Not on file  Intimate Partner Violence:   . Fear of Current or Ex-Partner: Not on file  . Emotionally Abused: Not on file  . Physically Abused: Not on file  . Sexually Abused: Not on file      Review of systems: All other review of systems negative except as mentioned in the HPI.   Physical Exam: Vitals:   10/03/19 1557  BP: 118/82  Pulse: 71   Body mass index is 46.22 kg/m. Gen:      No acute distress Abd:      + bowel sounds; soft, non-tender; no palpable masses, no distension Ext:    No edema; adequate peripheral perfusion Neuro: alert and oriented x 3 Psych: normal mood and affect  Data Reviewed:  Reviewed labs, radiology imaging, old records and pertinent past GI work up   Assessment and Plan/Recommendations:  44 year old male with morbid obesity, multiple comorbidities s/p spinal surgery with laminectomy L3-L4 with complaints of generalized abdominal discomfort and tightness  No acute pathology on CT abdomen pelvis other than right renal solid mass concerning for renal cell carcinoma.  Will refer patient back to urology to discuss management as he does not recall discussing it during his last visit with Dr. Karsten Ro.  Also complains of urinary  incontinence and penile dysfunction.  He is on chronic narcotics, complaints of fecal urgency though not constipated.  Possible overflow diarrhea Trial of low-dose laxative, Amitiza 24 mcg twice daily  Abdominal cramping: Dicyclomine ineffective, will switch to hyoscyamine 1 tablet twice daily as needed  Will consider EGD and colonoscopy if continues to have persistent symptoms, likely will be low yield for evaluation of abdominal pain given recent imaging was negative for any acute pathology  GERD: Continue antireflux measures and omeprazole daily  Return in 2 to 3 months or sooner if needed  This visit required 35 minutes of patient care (this includes precharting, chart review, review of results, face-to-face time used for counseling as well as treatment plan and follow-up. The patient was provided an opportunity to ask questions and all were answered. The patient agreed with the plan and demonstrated an understanding of the instructions.  Damaris Hippo , MD    CC: Wilber Oliphant, MD

## 2019-10-06 ENCOUNTER — Encounter: Payer: Self-pay | Admitting: Gastroenterology

## 2019-10-07 ENCOUNTER — Telehealth: Payer: Self-pay | Admitting: *Deleted

## 2019-10-07 NOTE — Telephone Encounter (Signed)
Sent in referral to [facility] today all office notes and demographics, waiting on response

## 2019-10-11 ENCOUNTER — Other Ambulatory Visit: Payer: Self-pay | Admitting: Nurse Practitioner

## 2019-10-11 DIAGNOSIS — R140 Abdominal distension (gaseous): Secondary | ICD-10-CM

## 2019-10-11 DIAGNOSIS — K5909 Other constipation: Secondary | ICD-10-CM

## 2019-10-11 DIAGNOSIS — R1084 Generalized abdominal pain: Secondary | ICD-10-CM

## 2019-10-11 DIAGNOSIS — R112 Nausea with vomiting, unspecified: Secondary | ICD-10-CM

## 2019-10-11 NOTE — Telephone Encounter (Signed)
Bre, ok to refill the Omeprazole  x 1 but not the Dicyclomine as Dr. Silverio Decamp stopped it at the time of his last office visit. Thx

## 2019-10-14 NOTE — Telephone Encounter (Signed)
Yes. It is all done. Iris Plazola said she has a voicemail from them calling to set up his appointment.

## 2019-10-14 NOTE — Telephone Encounter (Signed)
If patient is moving to [place], [place] okay to send referral to cancer center there for renal cell carcinoma and we can cancel the urology referral here.  Thank you

## 2019-10-14 NOTE — Telephone Encounter (Signed)
[facility] appointment cancelled. Beth, did you hear back from cancer center in [place], [place]? Looks like you sent a referral. Just want to make sure we get this patient all set before his move.

## 2019-10-14 NOTE — Telephone Encounter (Signed)
Dr Silverio Decamp- I see previous notes indicating patient is moving to [place], [place] in March and was wanting referral to cancer center there. We originally were planning on re-referral back to Urology here prior to that note. At this point, are we cancelling the urology referral and going with the cancer center referral in [facility]? Just dont want any confusion.Marland KitchenMarland KitchenMarland Kitchen

## 2019-10-15 ENCOUNTER — Encounter (HOSPITAL_COMMUNITY): Payer: Self-pay

## 2019-10-15 ENCOUNTER — Emergency Department (HOSPITAL_COMMUNITY)
Admission: EM | Admit: 2019-10-15 | Discharge: 2019-10-15 | Disposition: A | Discharge status: Home / Self Care | Payer: Medicaid Other | Attending: Emergency Medicine | Admitting: Emergency Medicine

## 2019-10-15 ENCOUNTER — Emergency Department (HOSPITAL_COMMUNITY): Payer: Medicaid Other

## 2019-10-15 ENCOUNTER — Other Ambulatory Visit: Payer: Self-pay

## 2019-10-15 DIAGNOSIS — R1030 Lower abdominal pain, unspecified: Secondary | ICD-10-CM | POA: Diagnosis not present

## 2019-10-15 DIAGNOSIS — R10817 Generalized abdominal tenderness: Secondary | ICD-10-CM | POA: Diagnosis not present

## 2019-10-15 DIAGNOSIS — Z86711 Personal history of pulmonary embolism: Secondary | ICD-10-CM | POA: Diagnosis not present

## 2019-10-15 DIAGNOSIS — I10 Essential (primary) hypertension: Secondary | ICD-10-CM | POA: Diagnosis not present

## 2019-10-15 DIAGNOSIS — Z87891 Personal history of nicotine dependence: Secondary | ICD-10-CM | POA: Diagnosis not present

## 2019-10-15 DIAGNOSIS — F319 Bipolar disorder, unspecified: Secondary | ICD-10-CM | POA: Insufficient documentation

## 2019-10-15 DIAGNOSIS — Z79899 Other long term (current) drug therapy: Secondary | ICD-10-CM | POA: Diagnosis not present

## 2019-10-15 LAB — COMPREHENSIVE METABOLIC PANEL
ALT: 22 U/L (ref 0–44)
AST: 25 U/L (ref 15–41)
Albumin: 3.6 g/dL (ref 3.5–5.0)
Alkaline Phosphatase: 94 U/L (ref 38–126)
Anion gap: 14 (ref 5–15)
BUN: 15 mg/dL (ref 6–20)
CO2: 21 mmol/L — ABNORMAL LOW (ref 22–32)
Calcium: 9 mg/dL (ref 8.9–10.3)
Chloride: 106 mmol/L (ref 98–111)
Creatinine, Ser: 0.89 mg/dL (ref 0.61–1.24)
GFR calc Af Amer: >60 mL/min (ref >60)
GFR calc non Af Amer: >60 mL/min (ref >60)
Glucose, Bld: 116 mg/dL — ABNORMAL HIGH (ref 70–99)
Potassium: 3.7 mmol/L (ref 3.5–5.1)
Sodium: 141 mmol/L (ref 135–145)
Total Bilirubin: 0.6 mg/dL (ref 0.3–1.2)
Total Protein: 7 g/dL (ref 6.5–8.1)

## 2019-10-15 LAB — CBC
HCT: 45.2 % (ref 39.0–52.0)
Hemoglobin: 14.5 g/dL (ref 13.0–17.0)
MCH: 30.8 pg (ref 26.0–34.0)
MCHC: 32.1 g/dL (ref 30.0–36.0)
MCV: 96 fL (ref 80.0–100.0)
Platelets: 199 10*3/uL (ref 150–400)
RBC: 4.71 MIL/uL (ref 4.22–5.81)
RDW: 12.3 % (ref 11.5–15.5)
WBC: 5.5 10*3/uL (ref 4.0–10.5)
nRBC: 0 % (ref 0.0–0.2)

## 2019-10-15 LAB — URINALYSIS, ROUTINE W REFLEX MICROSCOPIC
Bacteria, UA: NONE SEEN
Bilirubin Urine: NEGATIVE
Glucose, UA: NEGATIVE mg/dL
Hgb urine dipstick: NEGATIVE
Ketones, ur: NEGATIVE mg/dL
Leukocytes,Ua: NEGATIVE
Nitrite: NEGATIVE
Protein, ur: 30 mg/dL — AB
Specific Gravity, Urine: 1.026 (ref 1.005–1.030)
pH: 5 (ref 5.0–8.0)

## 2019-10-15 LAB — LIPASE, BLOOD: Lipase: 19 U/L (ref 11–51)

## 2019-10-15 MED ORDER — ONDANSETRON HCL 4 MG/2ML IJ SOLN
4.0000 mg | Freq: Once | INTRAMUSCULAR | Status: AC
  Administered 2019-10-15: 4 mg via INTRAVENOUS
  Filled 2019-10-15: qty 2

## 2019-10-15 MED ORDER — MORPHINE SULFATE (PF) 4 MG/ML IV SOLN
6.0000 mg | Freq: Once | INTRAVENOUS | Status: AC
  Administered 2019-10-15: 6 mg via INTRAVENOUS
  Filled 2019-10-15: qty 2

## 2019-10-15 MED ORDER — IOHEXOL 300 MG/ML  SOLN
100.0000 mL | Freq: Once | INTRAMUSCULAR | Status: AC | PRN
  Administered 2019-10-15: 100 mL via INTRAVENOUS

## 2019-10-15 MED ORDER — SODIUM CHLORIDE 0.9% FLUSH: 3.0000 mL | Freq: Once | INTRAVENOUS | Status: DC

## 2019-10-15 NOTE — Discharge Instructions (Addendum)
You were seen today for abdominal pain. Your workup was reassuring and showing no acute findings on your CT scan and lab work appearing unremarkable.Please follow up with GI specialist as well as urology to further follow the spot on your kidney. Thank you for allowing me to care for you today. Please return to the emergency department if you have new or worsening symptoms. Take your medications as instructed.

## 2019-10-15 NOTE — ED Provider Notes (Signed)
[facility] EMERGENCY DEPARTMENT Provider Note   CSN: [id number] Arrival date & time: 10/15/19  [id number]     History Chief Complaint  Patient presents with  . Abdominal Pain    Julian Lucas is a 44 y.o. male.  The history is provided by the patient and medical records. No language interpreter was used.     44 year old male with history of anxiety, bipolar, chronic pain, GERD, prior PE presenting complaining of abdominal pain.  Patient report for more than a year he has had persistent abdominal pain.  Abdominal pain is diffuse however today he developed acute onset of pain to his left abdomen and back region.  He report pain is sharp, stabbing, and felt as if something has exploded while he was sitting on a motorized scooter going shopping earlier today.  Pain is 8 out of 10, worse with movement.  Pain is now going across his abdomen.  No associated fever or chills no chest pain shortness of breath or productive cough no dysuria or hematuria.  No bowel or bladder changes.  No nausea vomiting or diarrhea.  He tried taking his muscle relaxant without relief.  He was told that he has a tumor in his kidney last month but also report recent lumbar spine surgery and therefore he is not ready for biopsy of the tumor yet.  Past Medical History:  Diagnosis Date  . Anxiety   . Bipolar disorder ([place])   . 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 Chronic pain of multiple joints   . DEGENERATIVE JOINT DISEASE, LEFT KNEE 09/09/2010   Qualifier: Diagnosis of  By: Oneida Alar MD, KARL    . Depression   . Family history of adverse reaction to anesthesia    mom takes a long time to wake up  . GERD (gastroesophageal reflux disease)   . HERPES GENITALIS 11/21/2009   Qualifier: Diagnosis of  By: Jeannine Kitten MD, Rodman Key    . Hypertension   . Multiple renal cysts   . Peripheral vascular disease ([place])   . Pneumonia   . PULMONARY EMBOLISM, HX OF 11/21/2009   Qualifier: Diagnosis of   By: Tye Savoy MD, Tommi Rumps    . Sleep apnea    does not use a cpap  . UNEQUAL LEG LENGTH 08/09/2010   Qualifier: Diagnosis of  By: Ernestina Patches MD, Remo Lipps      Patient Active Problem List   Diagnosis Date Noted  . Smokers' cough ([place]) 09/26/2019  . Generalized abdominal pain 09/19/2019  . Abdominal bloating 09/19/2019  . Vomiting 09/19/2019  . Spinal stenosis 09/12/2019  . Pilar cysts 07/09/2019  . VTE (venous thromboembolism) 12/31/2018  . Cannabis use disorder, mild, abuse 09/02/2018  . Osteoarthritis of left knee, severe 02/26/2018  . Abdominal pain, right upper quadrant 02/09/2018  . Myelopathy ([place]) 11/23/2017  . 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 Right foot pain 11/09/2015  . Pain in joint, ankle and foot 11/09/2015  . Heart burn 11/18/2013  . Dysphagia, unspecified(787.20) 11/18/2013  . Cervical disc disorder with radiculopathy of cervical region 07/27/2013  . Sciatica 01/06/2013  . Chronic pain of multiple joints 09/09/2010  . UNEQUAL LEG LENGTH 08/09/2010  . Ankle pain, right 08/02/2010  . Depression 04/25/2010  . Genital herpes 11/21/2009  . Severe obesity (BMI >= 40) ([place]) 11/21/2009  . TOBACCO ABUSE 11/21/2009  . HYPERTENSION, BENIGN ESSENTIAL 11/21/2009    Past Surgical History:  Procedure Laterality Date  . ANTERIOR CERVICAL DECOMP/DISCECTOMY FUSION N/A 11/23/2017  Procedure: ANTERIOR CERVICAL DECOMPRESSION AND FUSION CERVICAL SIX-SEVEN;  Surgeon: Kary Kos, MD;  Location: [facility];  Service: Neurosurgery;  Laterality: N/A;  anterior  . KNEE SURGERY    . LUMBAR LAMINECTOMY/DECOMPRESSION MICRODISCECTOMY Left 11/10/2018   Procedure: Laminectomy and Foraminotomy - Lumbar Three-Lumbar Four - Lumbar Four-Lumbar Five - Lumbar Five-Sacral One, laminectomy and microdiskectomy Thoracic Ten-Thoracic Eleven - left;  Surgeon: Kary Kos, MD;  Location: [facility];  Service: Neurosurgery;  Laterality: Left;  Laminectomy and Foraminotomy - Lumbar Three-Lumbar Four - Lumbar  Four-Lumbar Five - Lumbar Five-Sacral One, laminectomy and m  . LUMBAR LAMINECTOMY/DECOMPRESSION MICRODISCECTOMY Right 09/12/2019   Procedure: Laminectomy and Foraminotomy - right - Lumbar Two-Three, Lumbar Three-Four;  Surgeon: Kary Kos, MD;  Location: [facility];  Service: Neurosurgery;  Laterality: Right;  posterior  . NECK SURGERY  2011  . TONSILECTOMY, ADENOIDECTOMY, BILATERAL MYRINGOTOMY AND TUBES         Family History  Problem Relation Age of Onset  . Lung cancer Father   . Alcohol abuse Father   . Depression Mother   . Hypertension Mother   . Heart disease Mother   . Diabetes Mother   . Pancreatic cancer Maternal Uncle   . Colon cancer Neg Hx   . Stomach cancer Neg Hx   . Esophageal cancer Neg Hx     Social History   Tobacco Use  . Smoking status: Former Smoker    Types: Cigars  . Smokeless tobacco: Never Used  . Tobacco comment: 1-2 daily "Black and Milds"  Substance Use Topics  . Alcohol use: Never  . Drug use: Yes    Types: Marijuana    Comment: daily     Home Medications Prior to Admission medications   Medication Sig Start Date End Date Taking? Authorizing Provider  albuterol (VENTOLIN HFA) 108 (90 Base) MCG/ACT inhaler Inhale 2 puffs into the lungs every 4 (four) hours as needed for wheezing or shortness of breath. 09/26/19   Wilber Oliphant, MD  cyclobenzaprine (FLEXERIL) 5 MG tablet Take 5 mg by mouth 3 (three) times daily as needed for muscle spasms.    [provider]  dicyclomine (BENTYL) 20 MG tablet Take 1 tablet (20 mg total) by mouth 3 (three) times daily. 09/19/19   Noralyn Pick, NP  DULoxetine (CYMBALTA) 60 MG capsule Take 1 capsule (60 mg total) by mouth every morning. 09/26/19   Arfeen, Arlyce Harman, MD  gabapentin (NEURONTIN) 300 MG capsule Take 1 capsule (300 mg total) by mouth 3 (three) times daily. 10/03/19 11/02/19  Wilber Oliphant, MD  losartan (COZAAR) 25 MG tablet Take 1 tablet (25 mg total) by mouth daily. 04/15/19   Wilber Oliphant, MD    lubiprostone (AMITIZA) 24 MCG capsule Take 1 capsule (24 mcg total) by mouth 2 (two) times daily with a meal. 10/03/19   Nandigam, Venia Minks, MD  mirabegron ER (MYRBETRIQ) 50 MG TB24 tablet Take 50 mg by mouth daily.    [provider]  omeprazole (PRILOSEC) 40 MG capsule Take in the morning 30 minutes before breakfast 09/19/19   Noralyn Pick, NP  ondansetron [facility]) 4 MG tablet Place 1 tablet under your tongue to dissolve 09/19/19   Noralyn Pick, NP  Oxycodone HCl 10 MG TABS Take 1 tablet (10 mg total) by mouth every 6 (six) hours. Patient taking differently: Take 10 mg by mouth every 6 (six) hours as needed (pain.).  12/24/18   Law, Bea Graff, PA-C  QUEtiapine (SEROQUEL) 100 MG  tablet Take 1 tablet (100 mg total) by mouth at bedtime. 09/26/19   Arfeen, Arlyce Harman, MD  Spacer/Aero-Holding Chambers (AEROCHAMBER MINI CHAMBER) DEVI 1 Units by Does not apply route daily. Use with inhaler 09/26/19   Wilber Oliphant, MD    Allergies    Patient has no known allergies.  Review of Systems   Review of Systems  All other systems reviewed and are negative.   Physical Exam Updated Vital Signs BP (!) 136/99 (BP Location: Left Arm)   Pulse 86   Temp 98.2 F (36.8 C) (Oral)   Resp (!) 24   SpO2 100%   Physical Exam Vitals and nursing note reviewed.  Constitutional:      General: He is not in acute distress.    Appearance: He is well-developed.  HENT:     Head: Atraumatic.  Eyes:     Conjunctiva/sclera: Conjunctivae normal.  Cardiovascular:     Rate and Rhythm: Normal rate and regular rhythm.     Heart sounds: Normal heart sounds.  Pulmonary:     Effort: Pulmonary effort is normal.     Breath sounds: Normal breath sounds.  Abdominal:     General: Abdomen is flat.     Palpations: Abdomen is soft.     Tenderness: There is generalized abdominal tenderness (Diffuse abdominal tenderness most significant to left side abdomen on palpation no guarding or rebound tenderness.   Negative for CVA tenderness). There is no right CVA tenderness or left CVA tenderness.     Hernia: No hernia is present.  Musculoskeletal:     Cervical back: Neck supple.  Skin:    Findings: No rash.  Neurological:     Mental Status: He is alert.     ED Results / Procedures / Treatments   Labs (all labs ordered are listed, but only abnormal results are displayed) Labs Reviewed  COMPREHENSIVE METABOLIC PANEL - Abnormal; Notable for the following components:      Result Value   CO2 21 (*)    Glucose, Bld 116 (*)    All other components within normal limits  URINALYSIS, ROUTINE W REFLEX MICROSCOPIC - Abnormal; Notable for the following components:   Protein, ur 30 (*)    All other components within normal limits  LIPASE, BLOOD  CBC    EKG None  Radiology No results found.  Procedures Procedures (including critical care time)  Medications Ordered in ED Medications  sodium chloride flush (NS) 0.9 % injection 3 mL (has no administration in time range)    ED Course  I have reviewed the triage vital signs and the nursing notes.  Pertinent labs & imaging results that were available during my care of the patient were reviewed by me and considered in my medical decision making (see chart for details).    MDM Rules/Calculators/A&P                      BP 108/80   Pulse 67   Temp 98.2 F (36.8 C) (Oral)   Resp (!) 22   SpO2 92%   Final Clinical Impression(s) / ED Diagnoses Final diagnoses:  None    Rx / DC Orders ED Discharge Orders    None     6:02 AM Patient with acute on chronic abdominal pain.  History of suspected renal cell carcinoma now with new left-sided abdomen/flank pain.  He felt as if something may have ruptured causing his pain.  He denies any recent injury.  No  history of kidney stone.  Due to potential RCC and now with worsening pain, will obtain abdominal pelvis CT scan for further evaluation.  Pain medication given.  His labs are reassuring.   UA without hematuria to suggest kidney stone.  6:46 AM Pt sign out to oncoming team who will f/u on CT result, reassess and determine disposition.    Domenic Moras, PA-C 10/15/19 [id number]    Ripley Fraise, MD 10/15/19 [phone]

## 2019-10-15 NOTE — ED Triage Notes (Signed)
Pt reports that today he began to have LUQ abd pain, denies n/v/d denies fevers.

## 2019-10-15 NOTE — ED Provider Notes (Addendum)
  Physical Exam  BP (!) 127/93 (BP Location: Left Arm)   Pulse 72   Temp 98.1 F (36.7 C) (Oral)   Resp 15   SpO2 95%   Physical Exam Vitals and nursing note reviewed.  Constitutional:      Appearance: Normal appearance.  HENT:     Head: Normocephalic.  Eyes:     Conjunctiva/sclera: Conjunctivae normal.  Pulmonary:     Effort: Pulmonary effort is normal.  Skin:    General: Skin is dry.  Neurological:     Mental Status: He is alert.  Psychiatric:        Mood and Affect: Mood normal.     ED Course/Procedures   Clinical Course as of Oct 14 1313  Sat Oct 15, 2019  0801 Patient remained stable and work-up is unremarkable.  Advised patient that he needs to follow-up with urology and GI specialist.  I did offer the patient a trial course of Bentyl for his symptoms but he declined.   [KM]    Clinical Course User Index [KM] Alveria Apley, PA-C    Procedures  MDM  Patient care was assumed from evaluation PA due to change of shift.  Please see his note for full work-up and HPI.  Briefly, patient is a 44 year old gentleman with history of chronic abdominal pain presenting for worsening of his usual symptoms in the LLQ and lumbar region.  Patient is seeing [facility] patient with recent lumbar laminectomy as well.  Denies nausea, vomiting, fever chills or other systemic symptoms. Labs are unremarkable. CT scan pending. If negative, d/c home with GI f/u. Also reports f/u with Dr. Karsten Ro for renal cell carcinoma found on previous imaging.      Alveria Apley, PA-C 10/15/19 0804    Alveria Apley, PA-C 10/15/19 1315    Quintella Reichert, MD 10/16/19 [phone]

## 2019-11-16 ENCOUNTER — Other Ambulatory Visit: Payer: Self-pay | Admitting: Gastroenterology

## 2019-11-16 DIAGNOSIS — R112 Nausea with vomiting, unspecified: Secondary | ICD-10-CM

## 2019-11-16 DIAGNOSIS — R1084 Generalized abdominal pain: Secondary | ICD-10-CM

## 2019-11-16 DIAGNOSIS — R140 Abdominal distension (gaseous): Secondary | ICD-10-CM

## 2019-11-16 DIAGNOSIS — K5909 Other constipation: Secondary | ICD-10-CM

## 2019-11-19 ENCOUNTER — Other Ambulatory Visit: Payer: Self-pay | Admitting: Nurse Practitioner

## 2019-11-19 DIAGNOSIS — R1084 Generalized abdominal pain: Secondary | ICD-10-CM

## 2019-11-19 DIAGNOSIS — R112 Nausea with vomiting, unspecified: Secondary | ICD-10-CM

## 2019-11-19 DIAGNOSIS — K5909 Other constipation: Secondary | ICD-10-CM

## 2019-11-19 DIAGNOSIS — R140 Abdominal distension (gaseous): Secondary | ICD-10-CM

## 2019-11-26 ENCOUNTER — Encounter: Payer: Self-pay | Admitting: Family Medicine

## 2019-11-26 ENCOUNTER — Other Ambulatory Visit: Payer: Self-pay | Admitting: Family Medicine

## 2019-11-30 MED ORDER — LOSARTAN POTASSIUM 25 MG PO TABS: 25.0000 mg | ORAL_TABLET | Freq: Every day | ORAL | 0 refills | Status: DC

## 2019-11-30 MED ORDER — GABAPENTIN 300 MG PO CAPS: 300.0000 mg | ORAL_CAPSULE | Freq: Three times a day (TID) | ORAL | 0 refills | Status: AC

## 2019-12-08 ENCOUNTER — Telehealth (HOSPITAL_COMMUNITY): Payer: Self-pay

## 2019-12-08 DIAGNOSIS — F331 Major depressive disorder, recurrent, moderate: Secondary | ICD-10-CM

## 2019-12-08 DIAGNOSIS — F0630 Mood disorder due to known physiological condition, unspecified: Secondary | ICD-10-CM

## 2019-12-08 MED ORDER — DULOXETINE HCL 60 MG PO CPEP: 60.0000 mg | ORAL_CAPSULE | ORAL | 0 refills | Status: AC

## 2019-12-08 MED ORDER — QUETIAPINE FUMARATE 100 MG PO TABS: 100.0000 mg | ORAL_TABLET | Freq: Every day | ORAL | 0 refills | Status: DC

## 2019-12-08 NOTE — Telephone Encounter (Signed)
Provided a 30-day supply of Seroquel and Cymbalta until his next appointment.

## 2019-12-08 NOTE — Telephone Encounter (Signed)
Pt called requesting refills on prescription seroquel and cymbalta. Pt states he moved and is requesting rx be sent to [facility] on [address] in [place], [place]. Next appointment 12/26/19. Last refill 09/26/19.

## 2019-12-15 ENCOUNTER — Other Ambulatory Visit: Payer: Self-pay | Admitting: Gastroenterology

## 2019-12-15 DIAGNOSIS — R140 Abdominal distension (gaseous): Secondary | ICD-10-CM

## 2019-12-15 DIAGNOSIS — R1084 Generalized abdominal pain: Secondary | ICD-10-CM

## 2019-12-15 DIAGNOSIS — R112 Nausea with vomiting, unspecified: Secondary | ICD-10-CM

## 2019-12-15 DIAGNOSIS — K5909 Other constipation: Secondary | ICD-10-CM

## 2019-12-23 ENCOUNTER — Other Ambulatory Visit: Payer: Self-pay | Admitting: Family Medicine

## 2019-12-26 ENCOUNTER — Telehealth (HOSPITAL_COMMUNITY): Payer: Medicaid - Out of State | Admitting: Psychiatry

## 2019-12-26 ENCOUNTER — Other Ambulatory Visit: Payer: Self-pay

## 2020-01-02 ENCOUNTER — Other Ambulatory Visit (HOSPITAL_COMMUNITY): Payer: Self-pay | Admitting: Psychiatry

## 2020-01-02 DIAGNOSIS — F331 Major depressive disorder, recurrent, moderate: Secondary | ICD-10-CM

## 2020-01-02 DIAGNOSIS — F0630 Mood disorder due to known physiological condition, unspecified: Secondary | ICD-10-CM

## 2020-01-02 MED ORDER — QUETIAPINE FUMARATE 100 MG PO TABS: 100.0000 mg | ORAL_TABLET | Freq: Every day | ORAL | 0 refills | Status: AC

## 2020-01-03 ENCOUNTER — Other Ambulatory Visit: Payer: Self-pay | Admitting: Gastroenterology

## 2020-01-03 DIAGNOSIS — K5909 Other constipation: Secondary | ICD-10-CM

## 2020-01-03 DIAGNOSIS — R1084 Generalized abdominal pain: Secondary | ICD-10-CM

## 2020-01-03 DIAGNOSIS — R140 Abdominal distension (gaseous): Secondary | ICD-10-CM

## 2020-01-03 DIAGNOSIS — R112 Nausea with vomiting, unspecified: Secondary | ICD-10-CM

## 2020-01-04 ENCOUNTER — Other Ambulatory Visit: Payer: Self-pay

## 2020-01-04 MED ORDER — APIXABAN 5 MG PO TABS: 5.0000 mg | ORAL_TABLET | Freq: Two times a day (BID) | ORAL | 0 refills | Status: DC

## 2020-01-04 NOTE — Telephone Encounter (Signed)
Patient calls nurse line requesting refill on Eliquis. This is not on current medication list. Please advise if refill is appropriate.   To PCP  Talbot Grumbling, RN

## 2020-02-17 ENCOUNTER — Inpatient Hospital Stay: Payer: Medicaid - Out of State | Attending: Oncology | Admitting: Oncology

## 2020-03-15 ENCOUNTER — Other Ambulatory Visit: Payer: Self-pay | Admitting: Family Medicine

## 2020-09-28 ENCOUNTER — Other Ambulatory Visit (HOSPITAL_COMMUNITY): Payer: Self-pay | Admitting: Psychiatry

## 2020-09-28 DIAGNOSIS — F331 Major depressive disorder, recurrent, moderate: Secondary | ICD-10-CM

## 2020-09-28 DIAGNOSIS — F0630 Mood disorder due to known physiological condition, unspecified: Secondary | ICD-10-CM

## 2021-01-22 IMAGING — MR MR ANKLE*R* W/O CM
4 of 5 series · 12 of 40 positions shown · non-contrast
Comparison: July 11, 2018 MRI

CLINICAL DATA: Chronic right ankle pain

EXAM:
MRI OF THE RIGHT ANKLE WITHOUT CONTRAST
TECHNIQUE: Multiplanar, multisequence MR imaging of the ankle was performed. No
intravenous contrast was administered.

[Series 4: PD fat-sat · axial · right · 3.0mm · 0.25mm/px · z∈[-151,-55]mm · 3 of 32 slices shown]
[im 4/32]
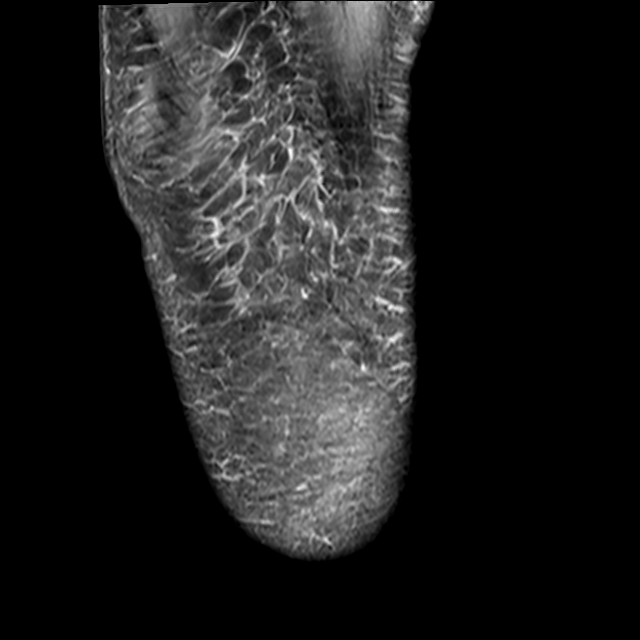
[im 16/32]
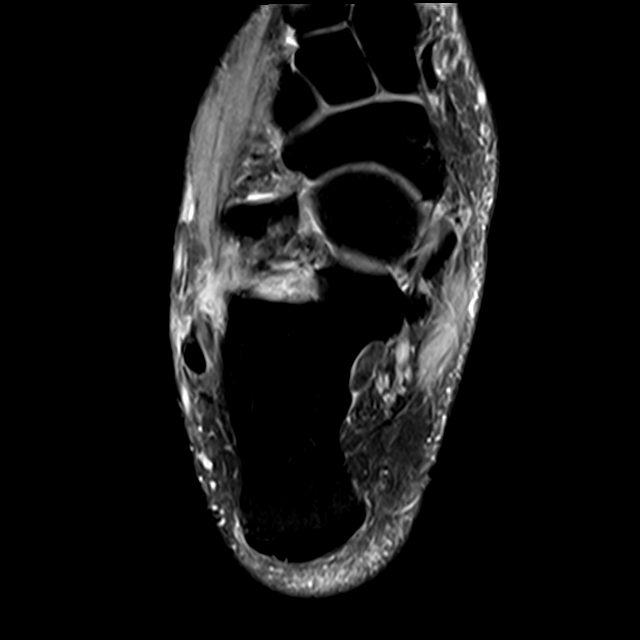
[im 28/32]
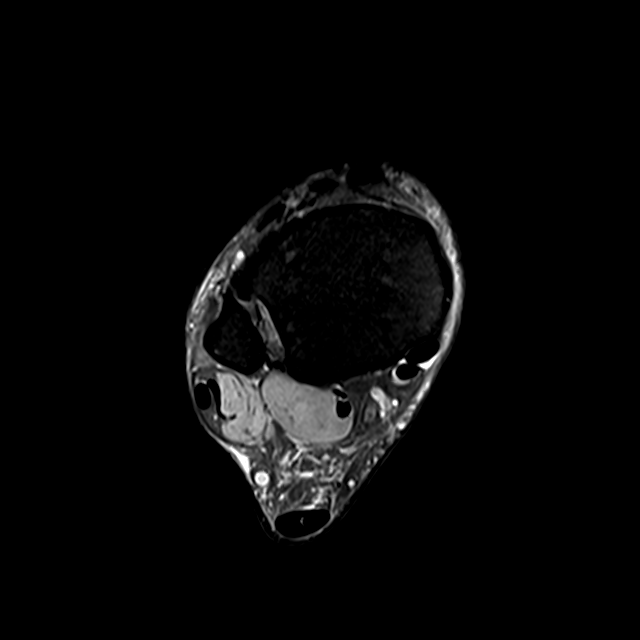

[Series 5: T2 fat-sat · axial · right · 3.0mm · 0.25mm/px · z∈[-151,-55]mm · 3 of 32 slices shown (1 of 2)]
[im 4/32]
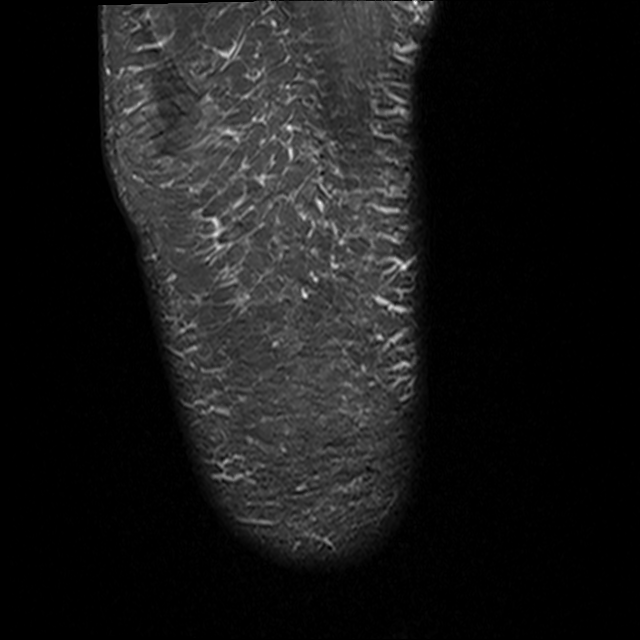
[im 16/32]
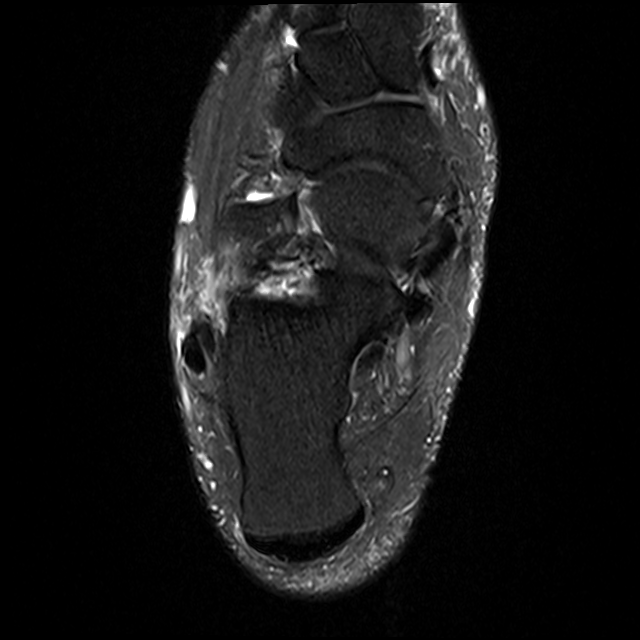
[im 28/32]
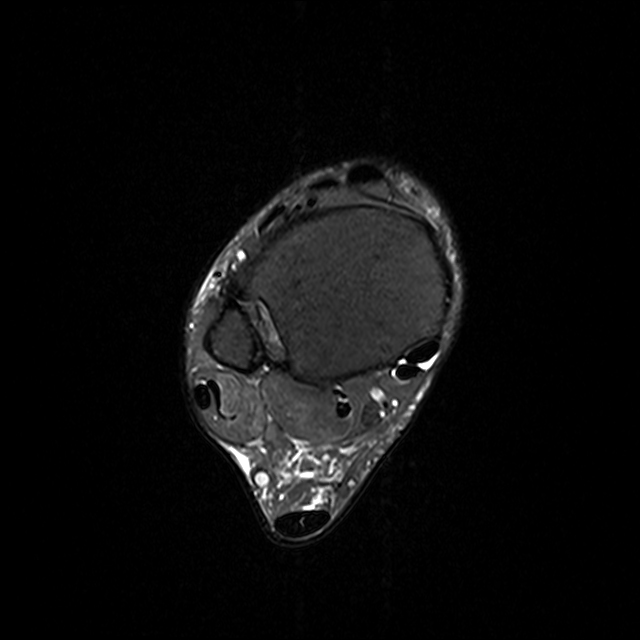

[Series 6: T1 · sagittal · right · 4.0mm · 0.27mm/px · 3 of 24 slices shown]
[im 5/24]
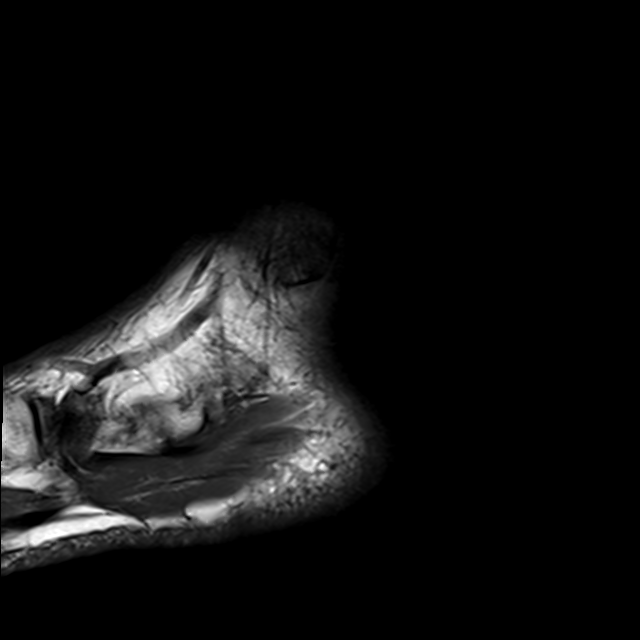
[im 14/24]
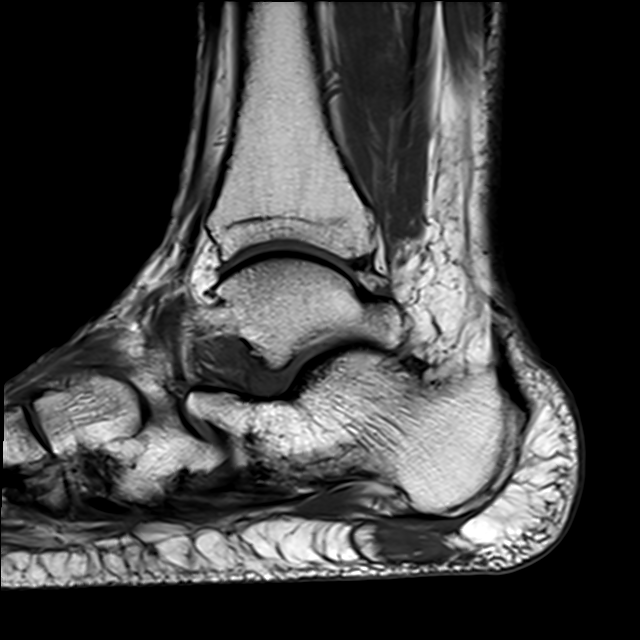
[im 24/24]
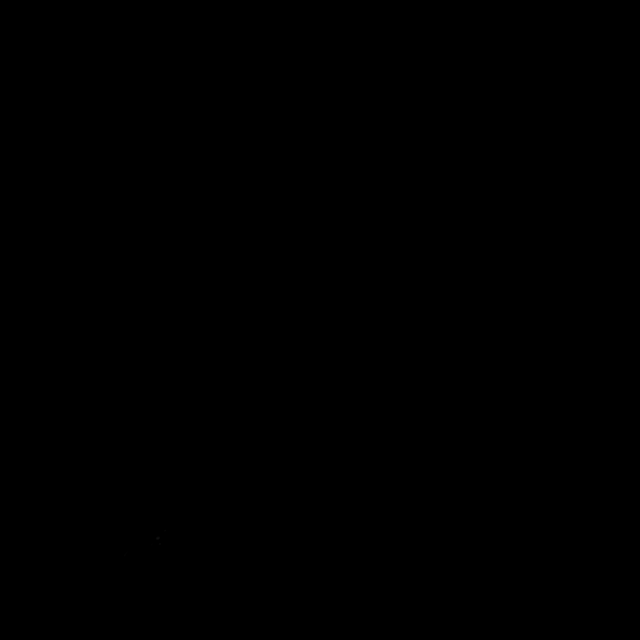

[Series 8: T2 fat-sat · coronal · right · 3.0mm · 0.25mm/px · 3 of 37 slices shown (2 of 2)]
[im 5/37]
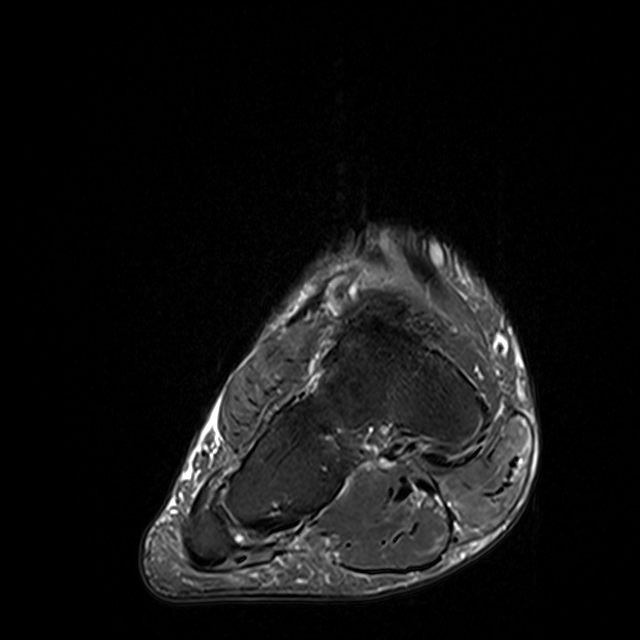
[im 21/37]
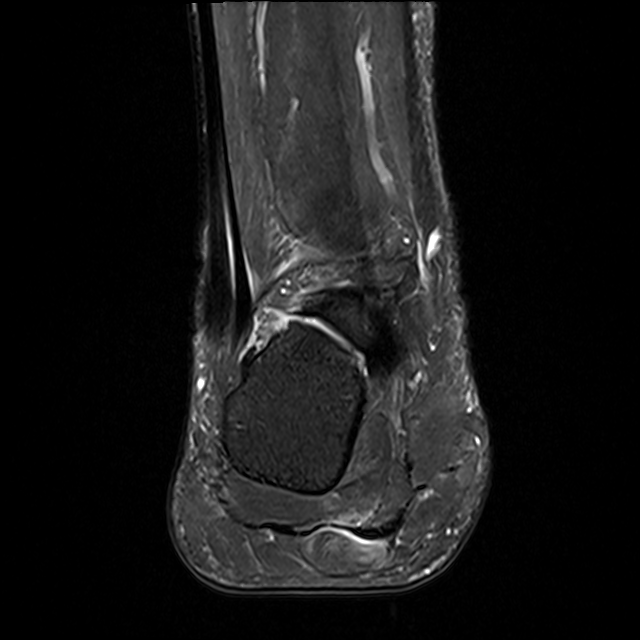
[im 33/37]
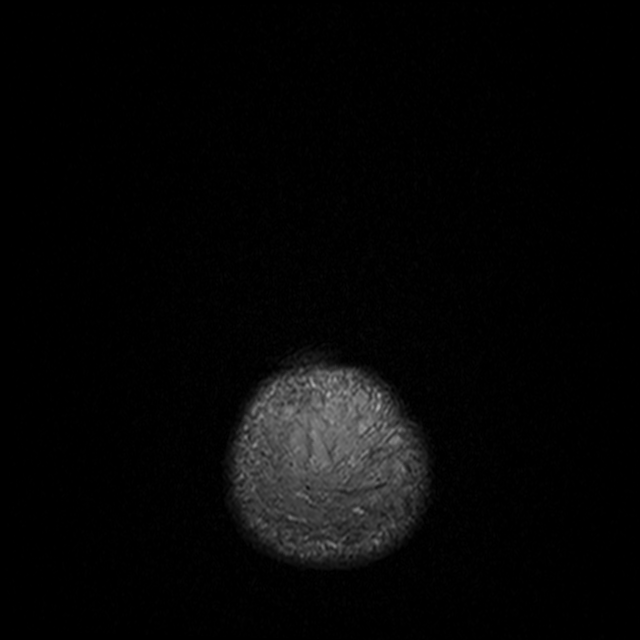

[12 of 40 positions shown; findings below may reference images not displayed]

FINDINGS: TENDONS

Peroneal: There is a small amount of fluid seen surrounding the
peroneal tendons with increased intrasubstance signal seen within
the peroneal brevis tendon, however it is intact.

Posteromedial: Posterior tibial tendon intact. Flexor hallucis
longus tendon intact. Flexor digitorum longus tendon intact. A small
amount of fluid is seen surrounding the flexor digitorum, posterior
tibialis, flexor hallucis longus tendon.

Anterior: Tibialis anterior tendon intact. Extensor hallucis longus
tendon intact Extensor digitorum longus tendon intact.

Achilles:  Intact.

Plantar Fascia: Increased intrasubstance signal and thickening seen
within the middle cord of the plantar fascia standing approximately
2 cm from the insertion site with adjacent reactive marrow in the
calcaneus. There is significant surrounding soft tissue edema heel
pad inflammation.

LIGAMENTS

Lateral: Anterior talofibular ligament intact. Calcaneofibular
ligament intact. Posterior talofibular ligament intact. Anterior and
posterior tibiofibular ligaments intact.

Medial: Deltoid ligament intact. Spring ligament intact.

CARTILAGE

Ankle Joint: No joint effusion. Normal ankle mortise. No chondral
defect. There is slight hindfoot valgus deformity.

Subtalar Joints/Sinus Tarsi: Again noted are subchondral cystic
changes seen at the lateral subtalar joint, not significantly
changed since the prior exam. There is edema seen within the sinus
tarsi with small cystic changes. There is a small amount subtalar
joint fluid.

Bones: No fracture or dislocation. No evidence of a vascular
necrosis.

Soft Tissue: Mild dorsal soft tissue edema noted. No loculated fluid
collection or soft tissue mass.
IMPRESSION: 1. Mild peroneal tenosynovitis.
2. Acute middle cord plantar fasciitis with adjacent heel pad
inflammation and reactive marrow in the calcaneal insertion site.
3. Slight hindfoot valgus deformity with lateral subtalar joint
degenerative change as on the prior exam.
4. Edema and small cystic changes seen within the sinus tarsi which
could be due to sinus tarsi syndrome.
5. Trace subtalar joint fluid.

## 2022-01-28 ENCOUNTER — Encounter: Payer: Self-pay | Admitting: *Deleted
